# Patient Record
Sex: Male | Born: 1950 | Race: Black or African American | Hispanic: No | Marital: Married | Smoking: Former smoker
Health system: Southern US, Community
[De-identification: ages and names within clinical notes are randomized; demographics above are authoritative.]

## PROBLEM LIST (undated history)

## (undated) DIAGNOSIS — I2510 Atherosclerotic heart disease of native coronary artery without angina pectoris: Secondary | ICD-10-CM

## (undated) DIAGNOSIS — I739 Peripheral vascular disease, unspecified: Secondary | ICD-10-CM

## (undated) DIAGNOSIS — J45909 Unspecified asthma, uncomplicated: Secondary | ICD-10-CM

## (undated) DIAGNOSIS — L209 Atopic dermatitis, unspecified: Secondary | ICD-10-CM

## (undated) DIAGNOSIS — K219 Gastro-esophageal reflux disease without esophagitis: Secondary | ICD-10-CM

## (undated) DIAGNOSIS — E559 Vitamin D deficiency, unspecified: Secondary | ICD-10-CM

## (undated) DIAGNOSIS — E079 Disorder of thyroid, unspecified: Secondary | ICD-10-CM

## (undated) DIAGNOSIS — K635 Polyp of colon: Secondary | ICD-10-CM

## (undated) DIAGNOSIS — K5900 Constipation, unspecified: Secondary | ICD-10-CM

## (undated) DIAGNOSIS — E039 Hypothyroidism, unspecified: Secondary | ICD-10-CM

## (undated) DIAGNOSIS — Z8719 Personal history of other diseases of the digestive system: Secondary | ICD-10-CM

## (undated) DIAGNOSIS — D649 Anemia, unspecified: Secondary | ICD-10-CM

## (undated) DIAGNOSIS — J189 Pneumonia, unspecified organism: Secondary | ICD-10-CM

## (undated) DIAGNOSIS — E782 Mixed hyperlipidemia: Secondary | ICD-10-CM

## (undated) DIAGNOSIS — N289 Disorder of kidney and ureter, unspecified: Secondary | ICD-10-CM

## (undated) DIAGNOSIS — F419 Anxiety disorder, unspecified: Secondary | ICD-10-CM

## (undated) DIAGNOSIS — E7800 Pure hypercholesterolemia, unspecified: Secondary | ICD-10-CM

## (undated) DIAGNOSIS — G4730 Sleep apnea, unspecified: Secondary | ICD-10-CM

## (undated) DIAGNOSIS — E119 Type 2 diabetes mellitus without complications: Secondary | ICD-10-CM

## (undated) DIAGNOSIS — I639 Cerebral infarction, unspecified: Secondary | ICD-10-CM

## (undated) DIAGNOSIS — I779 Disorder of arteries and arterioles, unspecified: Secondary | ICD-10-CM

## (undated) DIAGNOSIS — N400 Enlarged prostate without lower urinary tract symptoms: Secondary | ICD-10-CM

## (undated) DIAGNOSIS — M109 Gout, unspecified: Secondary | ICD-10-CM

## (undated) DIAGNOSIS — I10 Essential (primary) hypertension: Secondary | ICD-10-CM

## (undated) DIAGNOSIS — G4700 Insomnia, unspecified: Secondary | ICD-10-CM

## (undated) DIAGNOSIS — E11319 Type 2 diabetes mellitus with unspecified diabetic retinopathy without macular edema: Secondary | ICD-10-CM

## (undated) DIAGNOSIS — I219 Acute myocardial infarction, unspecified: Secondary | ICD-10-CM

## (undated) DIAGNOSIS — H409 Unspecified glaucoma: Secondary | ICD-10-CM

## (undated) HISTORY — DX: Pneumonia, unspecified organism: J18.9

## (undated) HISTORY — PX: CORONARY ARTERY BYPASS GRAFT: SHX141

## (undated) HISTORY — PX: POLYPECTOMY: SHX149

## (undated) HISTORY — PX: EXPLORATION POST OPERATIVE OPEN HEART: SHX5061

## (undated) HISTORY — PX: CORONARY ANGIOPLASTY: SHX604

## (undated) HISTORY — PX: OTHER SURGICAL HISTORY: SHX169

## (undated) HISTORY — PX: COLONOSCOPY: SHX174

## (undated) HISTORY — PX: EYE SURGERY: SHX253

## (undated) HISTORY — PX: ESOPHAGOGASTRODUODENOSCOPY: SHX1529

---

## 1992-11-11 HISTORY — PX: NASAL POLYP EXCISION: SHX2068

## 2007-08-20 ENCOUNTER — Ambulatory Visit: Payer: Self-pay | Admitting: Gastroenterology

## 2010-08-08 ENCOUNTER — Ambulatory Visit: Payer: Self-pay | Admitting: Gastroenterology

## 2011-02-06 ENCOUNTER — Observation Stay: Payer: Self-pay | Admitting: Internal Medicine

## 2012-01-15 ENCOUNTER — Other Ambulatory Visit: Payer: Self-pay | Admitting: Nephrology

## 2012-01-22 ENCOUNTER — Ambulatory Visit
Admission: RE | Admit: 2012-01-22 | Discharge: 2012-01-22 | Disposition: A | Discharge status: Home / Self Care | Payer: BC Managed Care – PPO | Source: Ambulatory Visit | Attending: Nephrology | Admitting: Nephrology

## 2012-01-22 ENCOUNTER — Other Ambulatory Visit: Payer: Self-pay

## 2012-01-23 ENCOUNTER — Other Ambulatory Visit: Payer: Self-pay

## 2012-12-07 DIAGNOSIS — E11311 Type 2 diabetes mellitus with unspecified diabetic retinopathy with macular edema: Secondary | ICD-10-CM | POA: Insufficient documentation

## 2014-05-23 ENCOUNTER — Ambulatory Visit: Payer: Self-pay | Admitting: Gastroenterology

## 2015-04-03 DIAGNOSIS — E1169 Type 2 diabetes mellitus with other specified complication: Secondary | ICD-10-CM | POA: Insufficient documentation

## 2015-04-03 DIAGNOSIS — E785 Hyperlipidemia, unspecified: Secondary | ICD-10-CM

## 2016-09-11 ENCOUNTER — Encounter: Payer: Medicare Other | Attending: Nurse Practitioner | Admitting: Dietician

## 2016-09-11 ENCOUNTER — Encounter: Payer: Self-pay | Admitting: Dietician

## 2016-09-11 VITALS — Ht 71.0 in | Wt 246.5 lb

## 2016-09-11 DIAGNOSIS — Z713 Dietary counseling and surveillance: Secondary | ICD-10-CM | POA: Insufficient documentation

## 2016-09-11 DIAGNOSIS — E119 Type 2 diabetes mellitus without complications: Secondary | ICD-10-CM | POA: Insufficient documentation

## 2016-09-11 DIAGNOSIS — E1122 Type 2 diabetes mellitus with diabetic chronic kidney disease: Secondary | ICD-10-CM

## 2016-09-11 DIAGNOSIS — N183 Chronic kidney disease, stage 3 (moderate): Secondary | ICD-10-CM

## 2016-09-11 DIAGNOSIS — Z794 Long term (current) use of insulin: Secondary | ICD-10-CM

## 2016-09-11 DIAGNOSIS — N289 Disorder of kidney and ureter, unspecified: Secondary | ICD-10-CM | POA: Insufficient documentation

## 2016-09-11 NOTE — Patient Instructions (Signed)
   Eat a small breakfast and lunch daily. Can be a snack with protein and healthy carb if needed.   Eat generous portions of vegetables.   Keep up making healthy food choices.

## 2016-09-11 NOTE — Progress Notes (Signed)
Medical Nutrition Therapy: Visit start time: 0930  end time: 1100  Assessment:  Diagnosis: Type 2DM, stage 3 renal disease Past medical history: gout, HTN, HLD, CAD, hypothyroidism Psychosocial issues/ stress concerns: patient reports high stress due to family issues, and recent death of brother-in-law Preferred learning method:  . Hands-on  Current weight: 246.5lbs  Height: 5'11" Medications, supplements: reviewed list in chart and MD notes with patient and wife.  Progress and evaluation: Patient tests fasting BGs and reports range of 80-203mg /dl in past month; takes Lantus and Novolog both at bedtime and occasionally tests BG then as well. He reports working to keep BGs under 200.  States he has trouble forgetting to take insulin, or remembering after eating. He is eating 1 meal daily, sometimes fried foods especially chicken. Wife bakes chicken low sodium. He does eat snacks before going to sleep and often wakes up during the night and will snack then as well.   Physical activity: walking 1/4 - 1/2 mile occasionally  Dietary Intake:  Usual eating pattern includes 1 meals and 1+ snacks per day. Dining out frequency: 2-3 meals per week.  Breakfast: small glass milk with meds Snack: none Lunch: none -- usually diet soda Snack: none Supper: baked chicken string beans and corn or potatoes, salad, dirty rice, <1z a month pork chop or roast beef or potato; occasionally baked sweet potato, steamed mixed veg (doesn't like much); hamburger patty. State meal is balanced about 3 x a week. Fast food 2-3 times a week Snack: 9-10pm more food similar to supper foods. Sometimes will eat a banana or leftovers if waking up during the night.  Beverages: water, some decaf coffee with milk or creamer, 1 pkg sugar  Nutrition Care Education: Topics covered: diabetes, kidney disease/ hypertension Basic nutrition: basic food groups, appropriate nutrient balance, appropriate meal and snack schedule, general  nutrition guidelines    Weight control: benefits of weight control, controlling food portions and choosing low fat foods. Diabetes:  appropriate meal and snack schedule, appropriate carb intake and balance, guidance for 1700kcal daily intake; practiced basic meal planning using food models and food lists. Discussed role of stress and sleep on BG control, as well as the role of infrequent eating and importance of eating at regular intervals.  Kidney disease/ Hypertension:  importance of controlling BP, identifying high sodium foods, identifying food sources of potassium, magnesium; controlling protein portions to preserve kidney health and decrease risk of uric acid accumulation.  Other lifestyle changes:  Advised patient to continue working on sleep issues to improve rest.   Nutritional Diagnosis:  [place]-2.2 Altered nutrition-related laboratory As related to diabetes, renal disease.  As evidenced by lab report, patient report. [place]-3.3 Overweight/obesity As related to hypthyroidism, inactivity.  As evidenced by patient report.  Intervention: Instruction as noted above.   Patient sleepy during visit due to poor sleep overnight; wife participated fully in discussion.    Set goals with patient and wife's input.    Patient elected to schedule follow-up in January.      Education Materials given:  . General diet guidelines for Diabetes . Food lists/ Planning A Balanced Meal . Sample meal pattern/ menus: Quick and Healthy Meal Ideas . Snacking handout  Food diary sheets for meal planning . Goals/ instructions  Learner/ who was taught:  . Patient  . Spouse: Willey Blade  Level of understanding: Marland Kitchen Verbalizes/ demonstrates competency  Demonstrated degree of understanding via:   Teach back Learning barriers: . None  Willingness to learn/ readiness for  change: . Eager, change in progress  Monitoring and Evaluation:  Dietary intake, exercise, BG control, and body weight      follow up: 11/20/16

## 2016-11-20 ENCOUNTER — Ambulatory Visit: Payer: PRIVATE HEALTH INSURANCE | Admitting: Dietician

## 2016-12-13 ENCOUNTER — Encounter: Payer: Self-pay | Admitting: Dietician

## 2016-12-13 NOTE — Progress Notes (Signed)
Have not heard back from patient to reschedule appointment, which he cancelled on 11/20/16. Sent discharge letter to NP.

## 2017-01-23 ENCOUNTER — Encounter (HOSPITAL_COMMUNITY): Payer: Self-pay | Admitting: Emergency Medicine

## 2017-01-23 DIAGNOSIS — Z794 Long term (current) use of insulin: Secondary | ICD-10-CM

## 2017-01-23 DIAGNOSIS — E785 Hyperlipidemia, unspecified: Secondary | ICD-10-CM | POA: Diagnosis not present

## 2017-01-23 DIAGNOSIS — Z87891 Personal history of nicotine dependence: Secondary | ICD-10-CM | POA: Diagnosis not present

## 2017-01-23 DIAGNOSIS — E1122 Type 2 diabetes mellitus with diabetic chronic kidney disease: Secondary | ICD-10-CM | POA: Diagnosis present

## 2017-01-23 DIAGNOSIS — E1165 Type 2 diabetes mellitus with hyperglycemia: Secondary | ICD-10-CM | POA: Diagnosis present

## 2017-01-23 DIAGNOSIS — I129 Hypertensive chronic kidney disease with stage 1 through stage 4 chronic kidney disease, or unspecified chronic kidney disease: Secondary | ICD-10-CM | POA: Diagnosis present

## 2017-01-23 DIAGNOSIS — Z79899 Other long term (current) drug therapy: Secondary | ICD-10-CM | POA: Diagnosis not present

## 2017-01-23 DIAGNOSIS — E669 Obesity, unspecified: Secondary | ICD-10-CM | POA: Diagnosis not present

## 2017-01-23 DIAGNOSIS — E039 Hypothyroidism, unspecified: Secondary | ICD-10-CM | POA: Diagnosis not present

## 2017-01-23 DIAGNOSIS — N182 Chronic kidney disease, stage 2 (mild): Secondary | ICD-10-CM | POA: Diagnosis present

## 2017-01-23 DIAGNOSIS — I6302 Cerebral infarction due to thrombosis of basilar artery: Principal | ICD-10-CM | POA: Diagnosis present

## 2017-01-23 DIAGNOSIS — R270 Ataxia, unspecified: Secondary | ICD-10-CM | POA: Diagnosis present

## 2017-01-23 DIAGNOSIS — I2510 Atherosclerotic heart disease of native coronary artery without angina pectoris: Secondary | ICD-10-CM | POA: Diagnosis not present

## 2017-01-23 DIAGNOSIS — Z6834 Body mass index (BMI) 34.0-34.9, adult: Secondary | ICD-10-CM

## 2017-01-23 DIAGNOSIS — D72829 Elevated white blood cell count, unspecified: Secondary | ICD-10-CM | POA: Diagnosis not present

## 2017-01-23 DIAGNOSIS — Z823 Family history of stroke: Secondary | ICD-10-CM

## 2017-01-23 DIAGNOSIS — R4781 Slurred speech: Secondary | ICD-10-CM | POA: Diagnosis present

## 2017-01-23 LAB — BASIC METABOLIC PANEL
Anion gap: 11 (ref 5–15)
BUN: 25 mg/dL — ABNORMAL HIGH (ref 6–20)
CALCIUM: 9.3 mg/dL (ref 8.9–10.3)
CO2: 22 mmol/L (ref 22–32)
CREATININE: 1.58 mg/dL — AB (ref 0.61–1.24)
Chloride: 102 mmol/L (ref 101–111)
GFR, EST AFRICAN AMERICAN: 51 mL/min — AB (ref >60)
GFR, EST NON AFRICAN AMERICAN: 44 mL/min — AB (ref >60)
Glucose, Bld: 260 mg/dL — ABNORMAL HIGH (ref 65–99)
Potassium: 4.2 mmol/L (ref 3.5–5.1)
SODIUM: 135 mmol/L (ref 135–145)

## 2017-01-23 LAB — URINALYSIS, ROUTINE W REFLEX MICROSCOPIC
BILIRUBIN URINE: NEGATIVE
Bacteria, UA: NONE SEEN
KETONES UR: NEGATIVE mg/dL
LEUKOCYTES UA: NEGATIVE
Nitrite: NEGATIVE
PH: 5 (ref 5.0–8.0)
Protein, ur: 100 mg/dL — AB
SPECIFIC GRAVITY, URINE: 1.012 (ref 1.005–1.030)

## 2017-01-23 LAB — CBG MONITORING, ED: Glucose-Capillary: 241 mg/dL — ABNORMAL HIGH (ref 65–99)

## 2017-01-23 LAB — CBC
HCT: 38.6 % — ABNORMAL LOW (ref 39.0–52.0)
Hemoglobin: 12.7 g/dL — ABNORMAL LOW (ref 13.0–17.0)
MCH: 29 pg (ref 26.0–34.0)
MCHC: 32.9 g/dL (ref 30.0–36.0)
MCV: 88.1 fL (ref 78.0–100.0)
PLATELETS: 263 10*3/uL (ref 150–400)
RBC: 4.38 MIL/uL (ref 4.22–5.81)
RDW: 12.8 % (ref 11.5–15.5)
WBC: 11.9 10*3/uL — AB (ref 4.0–10.5)

## 2017-01-23 NOTE — ED Triage Notes (Signed)
Pt states he is been having difficulty with ambulation, and slurred speech for the past 2 days, LSW by wife 2 days ago, family states that pt is getting progressively worse today.

## 2017-01-24 ENCOUNTER — Inpatient Hospital Stay (HOSPITAL_COMMUNITY)
Admission: EM | Admit: 2017-01-24 | Discharge: 2017-01-24 | DRG: 066 | Disposition: A | Discharge status: Home / Self Care | Payer: Medicare Other | Attending: Internal Medicine | Admitting: Internal Medicine

## 2017-01-24 ENCOUNTER — Inpatient Hospital Stay (HOSPITAL_COMMUNITY): Payer: Medicare Other

## 2017-01-24 ENCOUNTER — Emergency Department (HOSPITAL_COMMUNITY): Payer: Medicare Other

## 2017-01-24 ENCOUNTER — Encounter (HOSPITAL_COMMUNITY): Payer: Self-pay | Admitting: Family Medicine

## 2017-01-24 DIAGNOSIS — E669 Obesity, unspecified: Secondary | ICD-10-CM | POA: Diagnosis not present

## 2017-01-24 DIAGNOSIS — I2510 Atherosclerotic heart disease of native coronary artery without angina pectoris: Secondary | ICD-10-CM | POA: Diagnosis not present

## 2017-01-24 DIAGNOSIS — E1169 Type 2 diabetes mellitus with other specified complication: Secondary | ICD-10-CM | POA: Diagnosis present

## 2017-01-24 DIAGNOSIS — E039 Hypothyroidism, unspecified: Secondary | ICD-10-CM | POA: Diagnosis present

## 2017-01-24 DIAGNOSIS — I6789 Other cerebrovascular disease: Secondary | ICD-10-CM | POA: Diagnosis not present

## 2017-01-24 DIAGNOSIS — E785 Hyperlipidemia, unspecified: Secondary | ICD-10-CM

## 2017-01-24 DIAGNOSIS — R4781 Slurred speech: Secondary | ICD-10-CM | POA: Diagnosis present

## 2017-01-24 DIAGNOSIS — I10 Essential (primary) hypertension: Secondary | ICD-10-CM

## 2017-01-24 DIAGNOSIS — Z0189 Encounter for other specified special examinations: Secondary | ICD-10-CM

## 2017-01-24 DIAGNOSIS — Z823 Family history of stroke: Secondary | ICD-10-CM | POA: Diagnosis not present

## 2017-01-24 DIAGNOSIS — R42 Dizziness and giddiness: Secondary | ICD-10-CM

## 2017-01-24 DIAGNOSIS — I639 Cerebral infarction, unspecified: Secondary | ICD-10-CM

## 2017-01-24 DIAGNOSIS — I6302 Cerebral infarction due to thrombosis of basilar artery: Secondary | ICD-10-CM | POA: Diagnosis not present

## 2017-01-24 DIAGNOSIS — N182 Chronic kidney disease, stage 2 (mild): Secondary | ICD-10-CM | POA: Diagnosis not present

## 2017-01-24 DIAGNOSIS — I129 Hypertensive chronic kidney disease with stage 1 through stage 4 chronic kidney disease, or unspecified chronic kidney disease: Secondary | ICD-10-CM | POA: Diagnosis not present

## 2017-01-24 DIAGNOSIS — Z794 Long term (current) use of insulin: Secondary | ICD-10-CM | POA: Diagnosis not present

## 2017-01-24 DIAGNOSIS — Z6834 Body mass index (BMI) 34.0-34.9, adult: Secondary | ICD-10-CM | POA: Diagnosis not present

## 2017-01-24 DIAGNOSIS — R270 Ataxia, unspecified: Secondary | ICD-10-CM | POA: Diagnosis not present

## 2017-01-24 DIAGNOSIS — E1165 Type 2 diabetes mellitus with hyperglycemia: Secondary | ICD-10-CM | POA: Diagnosis not present

## 2017-01-24 DIAGNOSIS — E1122 Type 2 diabetes mellitus with diabetic chronic kidney disease: Secondary | ICD-10-CM | POA: Diagnosis not present

## 2017-01-24 DIAGNOSIS — D72829 Elevated white blood cell count, unspecified: Secondary | ICD-10-CM | POA: Diagnosis not present

## 2017-01-24 DIAGNOSIS — Z87891 Personal history of nicotine dependence: Secondary | ICD-10-CM | POA: Diagnosis not present

## 2017-01-24 DIAGNOSIS — G4733 Obstructive sleep apnea (adult) (pediatric): Secondary | ICD-10-CM

## 2017-01-24 DIAGNOSIS — Z79899 Other long term (current) drug therapy: Secondary | ICD-10-CM | POA: Diagnosis not present

## 2017-01-24 HISTORY — DX: Disorder of thyroid, unspecified: E07.9

## 2017-01-24 HISTORY — DX: Essential (primary) hypertension: I10

## 2017-01-24 HISTORY — DX: Type 2 diabetes mellitus without complications: E11.9

## 2017-01-24 HISTORY — DX: Atherosclerotic heart disease of native coronary artery without angina pectoris: I25.10

## 2017-01-24 HISTORY — DX: Disorder of kidney and ureter, unspecified: N28.9

## 2017-01-24 LAB — LIPID PANEL
CHOL/HDL RATIO: 3.6 ratio
Cholesterol: 147 mg/dL (ref 0–200)
HDL: 41 mg/dL (ref >40)
LDL CALC: 91 mg/dL (ref 0–99)
TRIGLYCERIDES: 74 mg/dL (ref <150)
VLDL: 15 mg/dL (ref 0–40)

## 2017-01-24 LAB — ECHOCARDIOGRAM COMPLETE
Height: 71 in
WEIGHTICAEL: 3912 [oz_av]

## 2017-01-24 LAB — GLUCOSE, CAPILLARY
GLUCOSE-CAPILLARY: 240 mg/dL — AB (ref 65–99)
Glucose-Capillary: 291 mg/dL — ABNORMAL HIGH (ref 65–99)

## 2017-01-24 MED ORDER — HYDROCHLOROTHIAZIDE 25 MG PO TABS
25.0000 mg | ORAL_TABLET | Freq: Every day | ORAL | Status: DC
  Administered 2017-01-24: 25 mg via ORAL
  Filled 2017-01-24: qty 1

## 2017-01-24 MED ORDER — ACETAMINOPHEN 650 MG RE SUPP: 650.0000 mg | RECTAL | Status: DC | PRN

## 2017-01-24 MED ORDER — TAMSULOSIN HCL 0.4 MG PO CAPS
0.4000 mg | ORAL_CAPSULE | Freq: Every day | ORAL | Status: DC
  Administered 2017-01-24: 0.4 mg via ORAL
  Filled 2017-01-24: qty 1

## 2017-01-24 MED ORDER — CLOPIDOGREL BISULFATE 75 MG PO TABS: 75.0000 mg | ORAL_TABLET | Freq: Every day | ORAL | 0 refills | Status: DC

## 2017-01-24 MED ORDER — LEVOTHYROXINE SODIUM 50 MCG PO TABS
50.0000 ug | ORAL_TABLET | Freq: Every day | ORAL | Status: DC
  Administered 2017-01-24: 50 ug via ORAL
  Filled 2017-01-24: qty 1

## 2017-01-24 MED ORDER — INSULIN GLARGINE 100 UNIT/ML ~~LOC~~ SOLN
66.0000 [IU] | Freq: Every day | SUBCUTANEOUS | Status: DC
  Filled 2017-01-24: qty 0.66

## 2017-01-24 MED ORDER — ASPIRIN 81 MG PO CHEW
324.0000 mg | CHEWABLE_TABLET | Freq: Once | ORAL | Status: AC
  Administered 2017-01-24: 324 mg via ORAL
  Filled 2017-01-24: qty 4

## 2017-01-24 MED ORDER — ASPIRIN 325 MG PO TABS: 325.0000 mg | ORAL_TABLET | Freq: Every day | ORAL | Status: DC

## 2017-01-24 MED ORDER — ASPIRIN 325 MG PO TABS: 325.0000 mg | ORAL_TABLET | Freq: Every day | ORAL | 0 refills | Status: DC

## 2017-01-24 MED ORDER — ASPIRIN 300 MG RE SUPP: 300.0000 mg | Freq: Every day | RECTAL | Status: DC

## 2017-01-24 MED ORDER — ACETAMINOPHEN 325 MG PO TABS: 650.0000 mg | ORAL_TABLET | ORAL | Status: DC | PRN

## 2017-01-24 MED ORDER — SENNOSIDES-DOCUSATE SODIUM 8.6-50 MG PO TABS: 1.0000 | ORAL_TABLET | Freq: Every evening | ORAL | Status: DC | PRN

## 2017-01-24 MED ORDER — INSULIN ASPART 100 UNIT/ML ~~LOC~~ SOLN: 0.0000 [IU] | Freq: Every day | SUBCUTANEOUS | Status: DC

## 2017-01-24 MED ORDER — ENOXAPARIN SODIUM 40 MG/0.4ML ~~LOC~~ SOLN
40.0000 mg | Freq: Every day | SUBCUTANEOUS | Status: DC
  Administered 2017-01-24: 40 mg via SUBCUTANEOUS
  Filled 2017-01-24: qty 0.4

## 2017-01-24 MED ORDER — ALPRAZOLAM 0.25 MG PO TABS
0.2500 mg | ORAL_TABLET | Freq: Two times a day (BID) | ORAL | Status: DC | PRN
Start: 2017-01-24 — End: 2017-01-24
  Administered 2017-01-24: 0.25 mg via ORAL
  Filled 2017-01-24: qty 1

## 2017-01-24 MED ORDER — ALPRAZOLAM 0.25 MG PO TABS: 0.2500 mg | ORAL_TABLET | Freq: Every evening | ORAL | Status: DC | PRN

## 2017-01-24 MED ORDER — STROKE: EARLY STAGES OF RECOVERY BOOK
Freq: Once | Status: AC
  Administered 2017-01-24: 14:00:00
  Filled 2017-01-24: qty 1

## 2017-01-24 MED ORDER — INSULIN ASPART 100 UNIT/ML ~~LOC~~ SOLN
0.0000 [IU] | Freq: Three times a day (TID) | SUBCUTANEOUS | Status: DC
  Administered 2017-01-24: 5 [IU] via SUBCUTANEOUS
  Administered 2017-01-24: 8 [IU] via SUBCUTANEOUS

## 2017-01-24 MED ORDER — ROSUVASTATIN CALCIUM 40 MG PO TABS
40.0000 mg | ORAL_TABLET | Freq: Every day | ORAL | Status: DC
  Filled 2017-01-24: qty 1

## 2017-01-24 MED ORDER — LISINOPRIL 20 MG PO TABS
40.0000 mg | ORAL_TABLET | Freq: Every day | ORAL | Status: DC
  Administered 2017-01-24: 40 mg via ORAL
  Filled 2017-01-24: qty 2

## 2017-01-24 MED ORDER — ACETAMINOPHEN 160 MG/5ML PO SOLN: 650.0000 mg | ORAL | Status: DC | PRN

## 2017-01-24 MED ORDER — METOPROLOL TARTRATE 25 MG PO TABS
25.0000 mg | ORAL_TABLET | Freq: Two times a day (BID) | ORAL | Status: DC
  Administered 2017-01-24: 25 mg via ORAL
  Filled 2017-01-24: qty 1

## 2017-01-24 MED ORDER — CLOPIDOGREL BISULFATE 75 MG PO TABS
75.0000 mg | ORAL_TABLET | Freq: Every day | ORAL | Status: DC
  Administered 2017-01-24: 75 mg via ORAL
  Filled 2017-01-24: qty 1

## 2017-01-24 MED ORDER — AMLODIPINE BESYLATE 5 MG PO TABS
5.0000 mg | ORAL_TABLET | Freq: Every day | ORAL | Status: DC
  Administered 2017-01-24: 5 mg via ORAL
  Filled 2017-01-24: qty 1

## 2017-01-24 NOTE — Progress Notes (Signed)
SLP Cancellation Note  Patient Details Name: Roberto Ingram MRN: [id number] DOB: 1951/04/27   Cancelled treatment:       Reason Eval/Treat Not Completed: Patient at procedure or test/unavailable x2.  Will f/u next date.    [name] 01/24/2017, 2:22 PM

## 2017-01-24 NOTE — ED Notes (Signed)
Admitting MD at the bedside.  

## 2017-01-24 NOTE — Progress Notes (Signed)
  Echocardiogram 2D Echocardiogram has been performed.  Jennette Dubin 01/24/2017, 10:10 AM

## 2017-01-24 NOTE — ED Notes (Signed)
Report attempted, RN to call back in 10 min.

## 2017-01-24 NOTE — ED Provider Notes (Signed)
TIME SEEN: 2:19 AM By signing my name below, I, Arianna Nassar, attest that this documentation has been prepared under the direction and in the presence of [employer], DO.  Electronically Signed: Julien Nordmann, ED Scribe. 01/24/17. 1:57 AM.  CHIEF COMPLAINT:  Chief Complaint  Patient presents with  . Dizziness    loose of balance   HPI:  HPI Comments: Roberto Ingram is a 66 y.o. male who has a PMhx of CAD, DM, HTN, and thyroid disease presents to the Emergency Department complaining of moderate, gradually worsening dizziness that he describes as off-balance that began two days ago (Wednesday 01/22/17) ~ noon. He expresses that he is unable to walk without stumbling. Pt notes feeling very off balance when ambulating. Per wife, pt has also been having slurred speech. He notes that when he looks down or lifts his head it exacerbates his symptoms. He is not in any current pain. Pt had a CT scan performed today at [place] that was ordered by his PCP at [facility] and was found to have a "black spot on my brain". He has not had any falls or trauma recently. Pt takes one 81 mg ASA daily but is not on any other anticoagulants.  Pt has no hx of TIA, CVA, a-fib, or high cholesterol. He denies headache, numbness, tingling, focal weakness, bilateral ear pain, tinnitus, Hearing loss, chest pain, shortness of breath. He is a former smoker and reports he stopped smoking in 1994.   ROS: See HPI Constitutional: no fever  Eyes: no drainage  ENT: no runny nose   Cardiovascular:  no chest pain  Resp: no SOB  GI: no vomiting GU: no dysuria Integumentary: no rash  Allergy: no hives  Musculoskeletal: no leg swelling  Neurological: no slurred speech ROS otherwise negative  PAST MEDICAL HISTORY/PAST SURGICAL HISTORY:  Past Medical History:  Diagnosis Date  . Coronary artery disease   . Diabetes mellitus without complication ([place])   . Hypertension   . Renal disorder   .  Thyroid disease     MEDICATIONS:  Prior to Admission medications   Medication Sig Start Date End Date Taking? Authorizing Provider  albuterol (PROVENTIL HFA;VENTOLIN HFA) 108 (90 Base) MCG/ACT inhaler Inhale into the lungs.    Historical Provider, MD  allopurinol (ZYLOPRIM) 20 mg/mL SUSP Take by mouth.    Historical Provider, MD  ALPRAZolam Duanne Moron) 0.5 MG tablet Take by mouth.    Historical Provider, MD  amLODipine (NORVASC) 5 MG tablet Take by mouth.    Historical Provider, MD  aspirin EC 81 MG tablet Take by mouth.    Historical Provider, MD  budesonide (PULMICORT) 180 MCG/ACT inhaler Inhale into the lungs as needed.    Historical Provider, MD  cetirizine (ZYRTEC) 10 MG tablet Take by mouth.    Historical Provider, MD  fluticasone (CUTIVATE) 0.05 % cream  06/29/10   Historical Provider, MD  hydrochlorothiazide (HYDRODIURIL) 25 MG tablet Take by mouth.    Historical Provider, MD  insulin glargine (LANTUS) 100 UNIT/ML injection Inject into the skin.    Historical Provider, MD  insulin regular (NOVOLIN R,HUMULIN R) 100 units/mL injection Inject 12 to 26 units subcutaneously three times a day before each meal 07/10/16   Historical Provider, MD  Insulin Syringe-Needle U-100 (INSULIN SYRINGE .5CC/31GX5/16") 31G X 5/16" 0.5 ML MISC 4 (four) times daily. 03/01/16   Historical Provider, MD  levothyroxine (SYNTHROID) 50 MCG tablet Take by mouth. 01/12/14   Historical Provider, MD  lisinopril (PRINIVIL,ZESTRIL) 40  MG tablet Take by mouth.    Historical Provider, MD  metoprolol tartrate (LOPRESSOR) 25 MG tablet Take by mouth. 03/28/14   Historical Provider, MD  nitroGLYCERIN (NITROSTAT) 0.4 MG SL tablet Place under the tongue.    Historical Provider, MD  Omega-3 Fatty Acids (FISH OIL) 1000 MG CAPS Take by mouth.    Historical Provider, MD  rosuvastatin (CRESTOR) 40 MG tablet Take by mouth. 03/29/14   Historical Provider, MD  tamsulosin (FLOMAX) 0.4 MG CAPS capsule Take by mouth.    Historical Provider, MD   traZODone (DESYREL) 50 MG tablet Take by mouth. 04/11/14   Historical Provider, MD  Vitamin D, Ergocalciferol, (DRISDOL) 50000 units CAPS capsule Take 1 tablet by mouth twice each week    Historical Provider, MD    ALLERGIES:  Allergies  Allergen Reactions  . Diphenhydramine Hcl Other (See Comments)    "The more I take, it makes my prostate swell..."  . Ezetimibe Other (See Comments)  . Metformin Other (See Comments)  . Other Other (See Comments) and Itching    FA Dye Prostate swelling  . Sulfa Antibiotics Other (See Comments)    SOCIAL HISTORY:  Social History  Substance Use Topics  . Smoking status: Former Smoker    Types: Cigarettes    Quit date: 09/11/1993  . Smokeless tobacco: Never Used  . Alcohol use No    FAMILY HISTORY: History reviewed. No pertinent family history.  EXAM: BP (!) 176/73   Pulse (!) 57   Temp 97.8 F (36.6 C)   Resp (!) 22   Ht 5\' 11"  (1.803 m)   Wt 246 lb (111.6 kg)   SpO2 100%   BMI 34.31 kg/m  CONSTITUTIONAL: Alert and oriented x 3 and responds appropriately to questions. Well-appearing; well-nourished, Elderly HEAD: Normocephalic, atraumatic EYES: Conjunctivae clear, pupils appear equal, EOMI ENT: normal nose; moist mucous membranes; No pharyngeal erythema or petechiae, no tonsillar hypertrophy or exudate, no uvular deviation, no unilateral swelling, no trismus or drooling, no muffled voice, normal phonation, no stridor, no dental caries present, no drainable dental abscess noted, no Ludwig's angina, tongue sits flat in the bottom of the mouth, no angioedema, no facial erythema or warmth, no facial swelling; no pain with movement of the neck.  TMs are clear bilaterally without erythema, purulence, bulging, perforation, effusion.  No cerumen impaction or sign of foreign body in the external auditory canal. No inflammation, erythema or drainage from the external auditory canal. No signs of mastoiditis. No pain with manipulation of the pinna  bilaterally. NECK: Supple, no meningismus, no nuchal rigidity, no LAD  CARD: RRR; S1 and S2 appreciated; no murmurs, no clicks, no rubs, no gallops RESP: Normal chest excursion without splinting or tachypnea; breath sounds clear and equal bilaterally; no wheezes, no rhonchi, no rales, no hypoxia or respiratory distress, speaking full sentences ABD/GI: Normal bowel sounds; non-distended; soft, non-tender, no rebound, no guarding, no peritoneal signs, no hepatosplenomegaly BACK:  The back appears normal and is non-tender to palpation, there is no CVA tenderness EXT: Normal ROM in all joints; non-tender to palpation; no edema; normal capillary refill; no cyanosis, no calf tenderness or swelling    SKIN: Normal color for age and race; warm; no rash NEURO: Moves all extremities equally; Strength 5/5 in all four extremities.  Normal sensation diffusely.  CN 2-12 grossly intact.  No dysmetria to finger to nose testing with the right arm but does seem that a little bit of difficulty with the left arm. He has normal  heel to shin testing with the right leg that appears to have some difficulty with the left leg. His speech seems to be slow and deliberate. I do notice mild dysarthria but no significant aphasia. PSYCH: The patient's mood and manner are appropriate. Grooming and personal hygiene are appropriate.  MEDICAL DECISION MAKING: Patient here with changes in his speech and gait for the past 2 days. Outside of TPA window. Had a head CT as an outpatient in [place] that he reports showed "a black spot on my brain". He does not, with records of these images and they are not in our system. No headache or recent head injury. Not on anticoagulation but is on aspirin. I doubt that this is an intracranial hemorrhage. Will proceed with MRI for evaluation for possible stroke. Labs show mild elevated kidney function 1.58 which he reports is his baseline. He is also mildly hyperglycemic without DKA. Urine shows hematuria,  proteinuria which is likely also chronic from his chronic kidney disease. No sign of UTI.  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 Again patient is outside TPA window given symptoms for over 24 hours. 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 There is no large vessel occasion seen on MRA. 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 We'll discuss with neurology on call.   5:30 AM  D/w Dr. Leonel Ramsay with neurology. He will see the patient consult. Will admit to medicine.   5:47 AM Discussed patient's case with hospitalist, Dr. Loleta Books.  I have recommended admission and patient (and family if present) agree with this plan. Admitting physician will place admission orders.   I reviewed all nursing notes, vitals, pertinent previous records, EKGs, lab and urine results, imaging (as available).     EKG Interpretation  Date/Time:  Thursday January 23 2017 21:16:29 EDT Ventricular Rate:  83 PR Interval:  166 QRS Duration: 100 QT Interval:  378 QTC Calculation: 444 R Axis:   -42 Text Interpretation:  Sinus rhythm with Premature atrial complexes Left axis deviation Left ventricular hypertrophy with repolarization abnormality Abnormal ECG No significant change since last tracing in 2012 Confirmed by Fremon Zacharia,  DO, Laquan Beier [phone]) on 01/24/2017 1:47:30 AM        I personally performed the services described in this documentation, which was scribed in my presence. The recorded information has been reviewed and is accurate.      [place], DO 01/24/17 [phone]

## 2017-01-24 NOTE — H&P (Signed)
History and Physical  Patient Name: Roberto Ingram     RCV:[id number]    DOB: 1951/09/12    DOA: 01/24/2017 PCP: Renee Rival, NP   Patient coming from: Home     Chief Complaint: Slurred speech  HPI: Roberto Ingram is a 66 y.o. male with a past medical history significant for IDDM, HTN, hypothyroidism who presents with slurred speech, leg weakness and ataxia for 3 days.  The patient was in his usual state of health until about 2 days ago when he had sudden onset of sensation of being "off balance", as well as heaviness of his left leg, and slurred speech. He was seen by his PCP, who ordered CT of the head, and then prescribed meclizine after that. Today symptoms seem to be getting worse, so he came to the emergency room.  ED course: -Afebrile, heart rate 60, respirations pulse ox normal, blood pressure 158/74. -Na 135, K 4.2, Cr 1.6 (baseline 1.6), WBC 11.9 K, Hgb 12.7 -Glucose 260 -MR brain showed a new pontine infarct      Review of systems:  Review of Systems  Neurological: Positive for dizziness, speech change and focal weakness.  All other systems reviewed and are negative.        Past Medical History:  Diagnosis Date  . Coronary artery disease   . Diabetes mellitus without complication ([place])   . Hypertension   . Renal disorder   . Thyroid disease     Past Surgical History:  Procedure Laterality Date  . EXPLORATION POST OPERATIVE OPEN HEART      Social History: Patient lives with his wife.  Patient walks unassisted.  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  He is a former smoker.    Allergies  Allergen Reactions  . Diphenhydramine Hcl Other (See Comments)    "The more I take, it makes my prostate swell..."  . Ezetimibe Other (See Comments)  . Metformin Other (See Comments)  . Other Other (See Comments) and Itching    FA Dye Prostate swelling  . Sulfa Antibiotics Other (See Comments)    Family history: family history includes  Atrial fibrillation in his mother; Diabetes in his brother and father; Heart attack in his brother; Heart disease in his mother; Lung cancer in his brother; Stroke in his brother and mother.  Prior to Admission medications   Medication Sig Start Date End Date Taking? Authorizing Provider  albuterol (PROVENTIL HFA;VENTOLIN HFA) 108 (90 Base) MCG/ACT inhaler Inhale into the lungs.    Historical Provider, MD  allopurinol (ZYLOPRIM) 20 mg/mL SUSP Take by mouth.    Historical Provider, MD  ALPRAZolam Duanne Moron) 0.5 MG tablet Take by mouth.    Historical Provider, MD  amLODipine (NORVASC) 5 MG tablet Take by mouth.    Historical Provider, MD  budesonide (PULMICORT) 180 MCG/ACT inhaler Inhale into the lungs as needed.    Historical Provider, MD  cetirizine (ZYRTEC) 10 MG tablet Take by mouth.    Historical Provider, MD  fluticasone (CUTIVATE) 0.05 % cream  06/29/10   Historical Provider, MD  hydrochlorothiazide (HYDRODIURIL) 25 MG tablet Take by mouth.    Historical Provider, MD  insulin glargine (LANTUS) 100 UNIT/ML injection Inject into the skin.    Historical Provider, MD  insulin regular (NOVOLIN R,HUMULIN R) 100 units/mL injection Inject 12 to 26 units subcutaneously three times a day before each meal 07/10/16   Historical Provider, MD  Insulin Syringe-Needle U-100 (INSULIN SYRINGE .5CC/31GX5/16") 31G X 5/16" 0.5 ML  MISC 4 (four) times daily. 03/01/16   Historical Provider, MD  levothyroxine (SYNTHROID) 50 MCG tablet Take by mouth. 01/12/14   Historical Provider, MD  lisinopril (PRINIVIL,ZESTRIL) 40 MG tablet Take by mouth.    Historical Provider, MD  metoprolol tartrate (LOPRESSOR) 25 MG tablet Take by mouth. 03/28/14   Historical Provider, MD  nitroGLYCERIN (NITROSTAT) 0.4 MG SL tablet Place under the tongue.    Historical Provider, MD  Omega-3 Fatty Acids (FISH OIL) 1000 MG CAPS Take by mouth.    Historical Provider, MD  rosuvastatin (CRESTOR) 40 MG tablet Take by mouth. 03/29/14   Historical Provider, MD   tamsulosin (FLOMAX) 0.4 MG CAPS capsule Take by mouth.    Historical Provider, MD  traZODone (DESYREL) 50 MG tablet Take by mouth. 04/11/14   Historical Provider, MD  Vitamin D, Ergocalciferol, (DRISDOL) 50000 units CAPS capsule Take 1 tablet by mouth twice each week    Historical Provider, MD     Physical Exam: BP (!) 177/73 (BP Location: Right Arm)   Pulse 63   Temp 97.4 F (36.3 C) (Oral)   Resp 18   Ht 5\' 11"  (1.803 m)   Wt 110.9 kg (244 lb 8 oz)   SpO2 98%   BMI 34.10 kg/m  General appearance: Well-developed, adult male, alert and in no acute distress.   Eyes: Anicteric, conjunctiva pink, lids and lashes normal. PERRL.    ENT: No nasal deformity, discharge, epistaxis.  Hearing normal. OP moist without lesions.   Dentition good. Lymph: No cervical, supraclavicular or axillary lymphadenopathy. Skin: Warm and dry.  No jaundice.  No suspicious rashes or lesions. Cardiac: RRR, nl S1-S2, no murmurs appreciated.  Capillary refill is brisk.  JVP normal.  No LE edema.  Radial and DP pulses 2+ and symmetric.  No carotid bruits. Respiratory: Normal respiratory rate and rhythm.  CTAB without rales or wheezes. GI: Abdomen soft without rigidity.  No TTP. No ascites, distension, no hepatosplenomegaly.   MSK: No deformities or effusions. Neuro: Pupils are 4 mm and reactive to 3 mm. Extraocular movements are intact, without nystagmus. Cranial nerve 5 is within normal limits. Cranial nerve 7 is symmetrical. Cranial nerve 8 is within normal limits. Cranial nerves 9 and 10 reveal equal palate elevation. Cranial nerve 11 reveals sternocleidomastoid strong. Cranial nerve 12 is midline. I do not note a deficit in motor strength testing in the upper and lower extremities bilaterally with normal motor, tone and bulk. Romberg maneuver is negative for pathology. Finger-to-nose testing is within normal limits. Speech is fluent. Naming is grossly intact. Attention span and concentration are within  normal limits.   Psych: The patient is oriented to time, place and person. Behavior appropriate.  Affect normal.  Recall, recent and remote, as well as general fund of knowledge seem within normal limits. No evidence of aural or visual hallucinations or delusions.       Labs on Admission:  I have personally reviewed following labs and imaging studies: CBC:  Recent Labs Lab 01/23/17 2125  WBC 11.9*  HGB 12.7*  HCT 38.6*  MCV 88.1  PLT 595   Basic Metabolic Panel:  Recent Labs Lab 01/23/17 2125  NA 135  K 4.2  CL 102  CO2 22  GLUCOSE 260*  BUN 25*  CREATININE 1.58*  CALCIUM 9.3   GFR: Estimated Creatinine Clearance: 58.2 mL/min (A) (by C-G formula based on SCr of 1.58 mg/dL (H)). Liver Function Tests: No results for input(s): AST, ALT, ALKPHOS, BILITOT, PROT, ALBUMIN in the  last 168 hours. No results for input(s): LIPASE, AMYLASE in the last 168 hours. No results for input(s): AMMONIA in the last 168 hours. Coagulation Profile: No results for input(s): INR, PROTIME in the last 168 hours. Cardiac Enzymes: No results for input(s): CKTOTAL, CKMB, CKMBINDEX, TROPONINI in the last 168 hours. BNP (last 3 results) No results for input(s): PROBNP in the last 8760 hours. HbA1C: No results for input(s): HGBA1C in the last 72 hours. CBG:  Recent Labs Lab 01/23/17 2125  GLUCAP 241*   Lipid Profile: No results for input(s): CHOL, HDL, LDLCALC, TRIG, CHOLHDL, LDLDIRECT in the last 72 hours. Thyroid Function Tests: No results for input(s): TSH, T4TOTAL, FREET4, T3FREE, THYROIDAB in the last 72 hours. Anemia Panel: No results for input(s): VITAMINB12, FOLATE, FERRITIN, TIBC, IRON, RETICCTPCT in the last 72 hours. Sepsis Labs: Invalid input(s): PROCALCITONIN, LACTICIDVEN No results found for this or any previous visit (from the past 240 hour(s)).    Radiological Exams on Admission: Personally reviewed MR report: Mr Virgel Paling BO Contrast  Result Date:  01/24/2017 CLINICAL DATA:  Gradually worsening dizziness and gait imbalance for 2 days. Slurred speech. History of hypertension and diabetes. Follow-up abnormal CT HEAD. EXAM: MRI HEAD WITHOUT CONTRAST MRA HEAD WITHOUT CONTRAST MRA NECK WITHOUT CONTRAST TECHNIQUE: Multiplanar, multiecho pulse sequences of the brain and surrounding structures were obtained without intravenous contrast. Angiographic images of the Circle of Willis were obtained using MRA technique without intravenous contrast. Angiographic images of the neck were obtained using MRA technique without intravenous contrast. Carotid stenosis measurements (when applicable) are obtained utilizing NASCET criteria, using the distal internal carotid diameter as the denominator. COMPARISON:  None. FINDINGS: MRI HEAD FINDINGS BRAIN: RIGHT pontine 28 x 8 mm (AP by transverse) reduced diffusion with low ADC values. No susceptibility artifact to suggest hemorrhage. The ventricles and sulci are normal for patient's age, cavum septum pellucidum et vergae. Patchy supratentorial pontine white matter FLAIR T2 hyperintensities exclusive of the aforementioned abnormality. Patchy small areas RIGHT frontal encephalomalacia. No suspicious parenchymal signal, masses or mass effect. No abnormal extra-axial fluid collections. VASCULAR: Normal major intracranial vascular flow voids present at skull base. SKULL AND UPPER CERVICAL SPINE: No abnormal sellar expansion. No suspicious calvarial bone marrow signal. Craniocervical junction maintained. SINUSES/ORBITS: Paranasal sinus mucosal thickening with maxillary mucosal retention cysts, status post FESS. The included ocular globes and orbital contents are non-suspicious. Status post bilateral ocular lens implants. OTHER: None. MRA HEAD FINDINGS ANTERIOR CIRCULATION: Flow related enhancement bilateral internal carotid arteries. Moderate stenosis RIGHT supraclinoid internal carotid artery. Mild stenosis RIGHT A1 segment. Bilateral A1  segment and anterior communicating artery are patent. Patent bilateral anterior cerebral arteries. Mild stenosis RIGHT M1 segment, bilateral middle cerebral arteries are patent. No aneurysm, severe stenosis, emergent large vessel occlusion or abnormal luminal irregularity. POSTERIOR CIRCULATION: RIGHT vertebral artery dominant. Flow related enhancement bilateral vertebral artery's. 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 Moderate stenosis distal basilar artery. Patent main branch vessels. Patent bilateral posterior cerebral arteries. ANATOMIC VARIANTS: None. MRA NECK FINDINGS- time-of-flight technique, arch vessel origins not characterized. ANTERIOR CIRCULATION: The common carotid arteries are widely patent bilaterally. The carotid bifurcations are patent bilaterally without hemodynamically significant stenosis by NASCET criteria. Mild luminal irregularity LEFT proximal internal carotid artery most compatible with atherosclerosis. No flow limiting stenosis. POSTERIOR CIRCULATION: Bilateral vertebral arteries are patent to the vertebrobasilar junction. No flow limiting stenosis or luminal irregularity. Moderate stenosis RIGHT mid V2 segment. Source images and MIP images were reviewed. IMPRESSION: MRI HEAD: Acute  RIGHT pontine small vessel infarct. Mild to moderate chronic small vessel ischemic disease. Old small RIGHT frontal lobe infarcts. MRA HEAD: No emergent large vessel occlusion. 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 Moderate stenosis RIGHT supraclinoid internal carotid artery and distal basilar artery compatible with atherosclerosis. MRA NECK: Limited time-of-flight technique. Moderate stenosis RIGHT V2 segment. No hemodynamically significant stenosis of the carotid artery's. Electronically Signed   By: Elon Alas M.D.   On: 01/24/2017 05:17   Mr Jodene Nam Neck Wo Contrast  Result Date: 01/24/2017 CLINICAL DATA:  Gradually worsening dizziness  and gait imbalance for 2 days. Slurred speech. History of hypertension and diabetes. Follow-up abnormal CT HEAD. EXAM: MRI HEAD WITHOUT CONTRAST MRA HEAD WITHOUT CONTRAST MRA NECK WITHOUT CONTRAST TECHNIQUE: Multiplanar, multiecho pulse sequences of the brain and surrounding structures were obtained without intravenous contrast. Angiographic images of the Circle of Willis were obtained using MRA technique without intravenous contrast. Angiographic images of the neck were obtained using MRA technique without intravenous contrast. Carotid stenosis measurements (when applicable) are obtained utilizing NASCET criteria, using the distal internal carotid diameter as the denominator. COMPARISON:  None. FINDINGS: MRI HEAD FINDINGS BRAIN: RIGHT pontine 28 x 8 mm (AP by transverse) reduced diffusion with low ADC values. No susceptibility artifact to suggest hemorrhage. The ventricles and sulci are normal for patient's age, cavum septum pellucidum et vergae. Patchy supratentorial pontine white matter FLAIR T2 hyperintensities exclusive of the aforementioned abnormality. Patchy small areas RIGHT frontal encephalomalacia. No suspicious parenchymal signal, masses or mass effect. No abnormal extra-axial fluid collections. VASCULAR: Normal major intracranial vascular flow voids present at skull base. SKULL AND UPPER CERVICAL SPINE: No abnormal sellar expansion. No suspicious calvarial bone marrow signal. Craniocervical junction maintained. SINUSES/ORBITS: Paranasal sinus mucosal thickening with maxillary mucosal retention cysts, status post FESS. The included ocular globes and orbital contents are non-suspicious. Status post bilateral ocular lens implants. OTHER: None. MRA HEAD FINDINGS ANTERIOR CIRCULATION: Flow related enhancement bilateral internal carotid arteries. Moderate stenosis RIGHT supraclinoid internal carotid artery. Mild stenosis RIGHT A1 segment. Bilateral A1 segment and anterior communicating artery are patent.  Patent bilateral anterior cerebral arteries. Mild stenosis RIGHT M1 segment, bilateral middle cerebral arteries are patent. No aneurysm, severe stenosis, emergent large vessel occlusion or abnormal luminal irregularity. POSTERIOR CIRCULATION: RIGHT vertebral artery dominant. Flow related enhancement bilateral vertebral artery's. 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 Moderate stenosis distal basilar artery. Patent main branch vessels. Patent bilateral posterior cerebral arteries. ANATOMIC VARIANTS: None. MRA NECK FINDINGS- time-of-flight technique, arch vessel origins not characterized. ANTERIOR CIRCULATION: The common carotid arteries are widely patent bilaterally. The carotid bifurcations are patent bilaterally without hemodynamically significant stenosis by NASCET criteria. Mild luminal irregularity LEFT proximal internal carotid artery most compatible with atherosclerosis. No flow limiting stenosis. POSTERIOR CIRCULATION: Bilateral vertebral arteries are patent to the vertebrobasilar junction. No flow limiting stenosis or luminal irregularity. Moderate stenosis RIGHT mid V2 segment. Source images and MIP images were reviewed. IMPRESSION: MRI HEAD: Acute RIGHT pontine small vessel infarct. Mild to moderate chronic small vessel ischemic disease. Old small RIGHT frontal lobe infarcts. MRA HEAD: No emergent large vessel occlusion. 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 Moderate stenosis RIGHT supraclinoid internal carotid artery and distal basilar artery compatible with atherosclerosis. MRA NECK: Limited time-of-flight technique. Moderate stenosis RIGHT V2 segment. No hemodynamically significant stenosis of the carotid artery's. Electronically Signed   By: Elon Alas M.D.   On: 01/24/2017 05:17   Mr Brain Wo Contrast  Result Date: 01/24/2017 CLINICAL  DATA:  Gradually worsening dizziness and gait imbalance for 2 days. Slurred speech. History of  hypertension and diabetes. Follow-up abnormal CT HEAD. EXAM: MRI HEAD WITHOUT CONTRAST MRA HEAD WITHOUT CONTRAST MRA NECK WITHOUT CONTRAST TECHNIQUE: Multiplanar, multiecho pulse sequences of the brain and surrounding structures were obtained without intravenous contrast. Angiographic images of the Circle of Willis were obtained using MRA technique without intravenous contrast. Angiographic images of the neck were obtained using MRA technique without intravenous contrast. Carotid stenosis measurements (when applicable) are obtained utilizing NASCET criteria, using the distal internal carotid diameter as the denominator. COMPARISON:  None. FINDINGS: MRI HEAD FINDINGS BRAIN: RIGHT pontine 28 x 8 mm (AP by transverse) reduced diffusion with low ADC values. No susceptibility artifact to suggest hemorrhage. The ventricles and sulci are normal for patient's age, cavum septum pellucidum et vergae. Patchy supratentorial pontine white matter FLAIR T2 hyperintensities exclusive of the aforementioned abnormality. Patchy small areas RIGHT frontal encephalomalacia. No suspicious parenchymal signal, masses or mass effect. No abnormal extra-axial fluid collections. VASCULAR: Normal major intracranial vascular flow voids present at skull base. SKULL AND UPPER CERVICAL SPINE: No abnormal sellar expansion. No suspicious calvarial bone marrow signal. Craniocervical junction maintained. SINUSES/ORBITS: Paranasal sinus mucosal thickening with maxillary mucosal retention cysts, status post FESS. The included ocular globes and orbital contents are non-suspicious. Status post bilateral ocular lens implants. OTHER: None. MRA HEAD FINDINGS ANTERIOR CIRCULATION: Flow related enhancement bilateral internal carotid arteries. Moderate stenosis RIGHT supraclinoid internal carotid artery. Mild stenosis RIGHT A1 segment. Bilateral A1 segment and anterior communicating artery are patent. Patent bilateral anterior cerebral arteries. Mild stenosis  RIGHT M1 segment, bilateral middle cerebral arteries are patent. No aneurysm, severe stenosis, emergent large vessel occlusion or abnormal luminal irregularity. POSTERIOR CIRCULATION: RIGHT vertebral artery dominant. Flow related enhancement bilateral vertebral artery's. 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 Moderate stenosis distal basilar artery. Patent main branch vessels. Patent bilateral posterior cerebral arteries. ANATOMIC VARIANTS: None. MRA NECK FINDINGS- time-of-flight technique, arch vessel origins not characterized. ANTERIOR CIRCULATION: The common carotid arteries are widely patent bilaterally. The carotid bifurcations are patent bilaterally without hemodynamically significant stenosis by NASCET criteria. Mild luminal irregularity LEFT proximal internal carotid artery most compatible with atherosclerosis. No flow limiting stenosis. POSTERIOR CIRCULATION: Bilateral vertebral arteries are patent to the vertebrobasilar junction. No flow limiting stenosis or luminal irregularity. Moderate stenosis RIGHT mid V2 segment. Source images and MIP images were reviewed. IMPRESSION: MRI HEAD: Acute RIGHT pontine small vessel infarct. Mild to moderate chronic small vessel ischemic disease. Old small RIGHT frontal lobe infarcts. MRA HEAD: No emergent large vessel occlusion. 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 Moderate stenosis RIGHT supraclinoid internal carotid artery and distal basilar artery compatible with atherosclerosis. MRA NECK: Limited time-of-flight technique. Moderate stenosis RIGHT V2 segment. No hemodynamically significant stenosis of the carotid artery's. Electronically Signed   By: Elon Alas M.D.   On: 01/24/2017 05:17     EKG: Independently reviewed. Rate 83, QTc 444, lateral TWI, no St changes.    Assessment/Plan Principal Problem:   Acute ischemic stroke [facility]) Active Problems:   Type 2 diabetes mellitus with  hyperlipidemia ([place])   Essential hypertension   Hypothyroidism (acquired)  1. Acute Stroke:  This is new.  MRI shows new pontine infarct.  MRA neck shows some stenosis in basilar arteries, carotids without significant stenosis.   -Admit to telemetry -Neuro checks, NIHSS per protocol -Daily aspirin 325 mg -2 days out from event, will continue BP medications -Lipids, hemoglobin A1c -Echocardiogram  ordered -PT/OT/SLP consultation -Consult to Neurology, appreciate recommendations -Continue Crestor -Stop baby aspirin   2. Diabetes:  -Continue Lantus 66 units -SSI with meals  3. HTN:  -Continue HCTZ, lisinopril, metoprolol  4. Hypothyroidism:  Stable.  -Continue levothyroxine  5. Other medications:  -Continue Flomax         DVT prophylaxis: Lovenox  Code Status: FULL  Family Communication: Wife at bedside  Disposition Plan: Anticipate Stroke work up as above and consult to ancillary services.  Expect discharge within 2 days. Consults called: Neurology, Dr. Leonel Ramsay has seen patient. Admission status: Telemetry, INPATIENT status  Core measures: -VTE prophylaxis ordered at time of admission -Aspirin ordered at admission -Atrial fibrillation: not knonw -tPA not given because of outside stroke window -Dysphagia screen ordered in ER -Lipids ordered -PT eval ordered -Nonsmoker    Medical decision making: Patient seen at 6:48 AM on 01/24/2017.  The patient was discussed with Dr. Leonides Schanz. What exists of the patient's chart was reviewed in depth and outside records were reviewed and summarized above.  Clinical condition: stable.       Edwin Dada [facility] Pager [phone]

## 2017-01-24 NOTE — Progress Notes (Signed)
Pt transported from ED to [place].  Patient vitals are stable and patient reports no pain.  Patient and wife were oriented to room, floor, and hospital policy.  Patient has no further questions at this time.

## 2017-01-24 NOTE — Care Management Note (Signed)
Case Management Note  Patient Details  Name: Roberto Ingram MRN: [id number] Date of Birth: February 02, 1951  Subjective/Objective:                    Action/Plan: Pt discharging home with self care. Pt with orders for 3 in 1. Brad with [facility] DME notified and will deliver the equipment to the room. CM consulted for outpatient therapy. Patient would like to go to therapy in [place] at [facility]. CM informed MD and he wrote out prescription for PT and CM provided it to the patients wife.  Patients wife to provide transportation home.   Expected Discharge Date:  01/24/17               Expected Discharge Plan:  Home/Self Care  In-House Referral:     Discharge planning Services  CM Consult  Post Acute Care Choice:    Choice offered to:  Patient, Spouse  DME Arranged:  3-N-1 DME Agency:  [place]:    [facility] Agency:     Status of Service:  Completed, signed off  If discussed at [place] of Stay Meetings, dates discussed:    Additional Comments:  Pollie Friar, RN 01/24/2017, 3:54 PM

## 2017-01-24 NOTE — Care Management Note (Signed)
Case Management Note  Patient Details  Name: Roberto Ingram MRN: [id number] Date of Birth: 05-24-1951  Subjective/Objective:  Pt admitted with CVA. He is from home with his spouse.              Action/Plan: Awaiting PT/OT recommendations. CM following for d/c needs, physician orders.  Expected Discharge Date:                  Expected Discharge Plan:     In-House Referral:     Discharge planning Services     Post Acute Care Choice:    Choice offered to:     DME Arranged:    DME Agency:     HH Arranged:    HH Agency:     Status of Service:  In process, will continue to follow  If discussed at Long Length of Stay Meetings, dates discussed:    Additional Comments:  Pollie Friar, RN 01/24/2017, 11:31 AM

## 2017-01-24 NOTE — Progress Notes (Signed)
Inpatient Diabetes Program Recommendations  AACE/ADA: New Consensus Statement on Inpatient Glycemic Control (2015)  Target Ranges:  Prepandial:   less than 140 mg/dL      Peak postprandial:   less than 180 mg/dL (1-2 hours)      Critically ill patients:  140 - 180 mg/dL   Lab Results  Component Value Date   GLUCAP 240 (H) 01/24/2017    Review of Glycemic Control  Diabetes history: DM Outpatient Diabetes medications: Lantus 66 units QHS (taken last on 3/14 evening), Novolog 12-26 units TID AC (patient states he often takes 20 units with 2 meals per day) Current orders for Inpatient glycemic control: Lantus 66 units daily QHS, moderate correction scale Novolog 0-15 units TIDAC and 0-5 units QHS  Inpatient Diabetes Program Recommendations:   Insulin - Basal: Patient states that he had experienced a low FBG yesterday of either 58 or 68 mg/dL.  Please consider decreasing Lantus to 50 units daily.  HgbA1C: A1C ordered today.  Noted patient is followed by Dr Gabriel Carina (Endocrinologist) and last seen by her on 01/01/2017.  Thank you,  Windy Carina, RN, MSN Diabetes Coordinator Inpatient Diabetes Program [phone] (Team Pager)

## 2017-01-24 NOTE — Evaluation (Signed)
Physical Therapy Evaluation Patient Details Name: Roberto Ingram MRN: [id number] DOB: August 08, 1951 Today's Date: 01/24/2017   History of Present Illness  Pt is a 66 y.o male who presents with slurred speech, LLE weakness, and ataxia x3 days. MRI revealed acute pontine stroke. PMH includes IDDM, HTN, hypothyroidism.   Clinical Impression  Pt admitted with above diagnosis. Pt currently with functional limitations due to the deficits listed below (see PT Problem List). At the time of PT eval pt was able to perform transfers with supervision to mod I and ambulation/stairs with min guard assist. Pt occasionally unsteady however was able to recover without assistance. Pt will benefit from skilled PT to increase their independence and safety with mobility to allow discharge to the venue listed below.       Follow Up Recommendations Outpatient PT    Equipment Recommendations  3in1 (PT)    Recommendations for Other Services       Precautions / Restrictions Precautions Precautions: Fall Restrictions Weight Bearing Restrictions: No      Mobility  Bed Mobility Overal bed mobility: Modified Independent             General bed mobility comments: Light use of rails. No assist required.   Transfers Overall transfer level: Needs assistance Equipment used: None Transfers: Sit to/from Stand Sit to Stand: Supervision         General transfer comment: Supervision for safety. No unsteadiness noted however increased time was taken standing EOB before initiating ambulation.   Ambulation/Gait Ambulation/Gait assistance: Min guard Ambulation Distance (Feet): 400 Feet Assistive device: None Gait Pattern/deviations: Step-through pattern;Decreased stride length;Trunk flexed Gait velocity: Decreased Gait velocity interpretation: Below normal speed for age/gender General Gait Details: VC's for improved posture. Pt occasionally appeared off balance but did not require assistance to recover.  Pt was able to complete some higher level balance tasks with min guard assist as well.   Stairs Stairs: Yes Stairs assistance: Min guard Stair Management: Two rails;Alternating pattern;Forwards Number of Stairs: 5 General stair comments: VC's for general safety  Wheelchair Mobility    Modified Rankin (Stroke Patients Only) Modified Rankin (Stroke Patients Only) Pre-Morbid Rankin Score: No symptoms Modified Rankin: No significant disability     Balance Overall balance assessment: Needs assistance Sitting-balance support: Feet supported;No upper extremity supported Sitting balance-Leahy Scale: Good     Standing balance support: No upper extremity supported;During functional activity Standing balance-Leahy Scale: Fair               High level balance activites: Direction changes;Turns;Head turns;Sudden stops   Standardized Balance Assessment Standardized Balance Assessment : Dynamic Gait Index   Dynamic Gait Index Level Surface: Mild Impairment Change in Gait Speed: Moderate Impairment Gait with Horizontal Head Turns: Moderate Impairment Gait with Vertical Head Turns: Moderate Impairment Gait and Pivot Turn: Mild Impairment Step Over Obstacle: Mild Impairment Step Around Obstacles: Mild Impairment Steps: Mild Impairment Total Score: 13       Pertinent Vitals/Pain Pain Assessment: No/denies pain    Home Living Family/patient expects to be discharged to:: Private residence Living Arrangements: Spouse/significant other Available Help at Discharge: Family;Available 24 hours/day Type of Home: House Home Access: Stairs to enter;Ramped entrance Entrance Stairs-Rails: Right;Left;Can reach both Entrance Stairs-Number of Steps: 7 in front, ramp in back Home Layout: One level Home Equipment: None      Prior Function Level of Independence: Independent               Hand Dominance  Extremity/Trunk Assessment   Upper Extremity Assessment Upper  Extremity Assessment: LUE deficits/detail;Defer to OT evaluation    Lower Extremity Assessment Lower Extremity Assessment: LLE deficits/detail LLE Deficits / Details: Decreased strength in the LLE however overall still grossly 4/5 LLE Coordination: decreased gross motor (Heel to shin very shaky and zig-zag)    Cervical / Trunk Assessment Cervical / Trunk Assessment: Other exceptions Cervical / Trunk Exceptions: Forward head/rounded shoulders  Communication   Communication: No difficulties  Cognition Arousal/Alertness: Awake/alert Behavior During Therapy: Flat affect Overall Cognitive Status: Within Functional Limits for tasks assessed                      General Comments      Exercises     Assessment/Plan    PT Assessment Patient needs continued PT services  PT Problem List Decreased strength;Decreased range of motion;Decreased activity tolerance;Decreased balance;Decreased mobility;Decreased knowledge of use of DME;Decreased safety awareness;Decreased knowledge of precautions;Decreased coordination       PT Treatment Interventions DME instruction;Gait training;Stair training;Functional mobility training;Therapeutic activities;Therapeutic exercise;Neuromuscular re-education;Patient/family education    PT Goals (Current goals can be found in the Care Plan section)  Acute Rehab PT Goals Patient Stated Goal: Pt did not state goals PT Goal Formulation: With patient/family Time For Goal Achievement: 01/31/17 Potential to Achieve Goals: Good    Frequency Min 4X/week   Barriers to discharge        Co-evaluation               End of Session Equipment Utilized During Treatment: Gait belt Activity Tolerance: Patient tolerated treatment well Patient left: in chair;with call bell/phone within reach;with chair alarm set;with family/visitor present Nurse Communication: Mobility status PT Visit Diagnosis: Other abnormalities of gait and mobility (R26.89)          Time: 1113-1140 PT Time Calculation (min) (ACUTE ONLY): 27 min   Charges:   PT Evaluation $PT Eval Moderate Complexity: 1 Procedure PT Treatments $Gait Training: 8-22 mins   PT G Codes:         Thelma Comp 01/24/2017, 3:05 PM   Rolinda Roan, PT, DPT Acute Rehabilitation Services Pager: [phone]

## 2017-01-24 NOTE — ED Notes (Signed)
Patient transported to MRI 

## 2017-01-24 NOTE — Consult Note (Signed)
Neurology Consultation Reason for Consult: Pontine stroke Referring Physician: Ward, K  CC: unsteady gait  History is obtained from: Patient call if  HPI: Roberto Ingram is a 66 y.o. male with a history of diabetes, coronary artery disease presents with trouble walking for the past few days. States that he was walking on Wednesday when all of a sudden he became slightly unsteady. He got worse that night. Since then he has been gradually getting better.  His wife states that he is also been slurring his speech. Due to discontinuing, they sought care in the emergency department yesterday evening and MRI was ordered which demonstrates a pontine infarct.   LKW: 3/14 tpa given?: no, out of window   ROS: A 14 point ROS was performed and is negative except as noted in the HPI.   Past Medical History:  Diagnosis Date  . Coronary artery disease   . Diabetes mellitus without complication ([place])   . Hypertension   . Renal disorder   . Thyroid disease      Family History  Problem Relation Age of Onset  . Atrial fibrillation Mother   . Heart disease Mother   . Stroke Mother   . Diabetes Father   . Stroke Brother   . Diabetes Brother   . Lung cancer Brother   . Heart attack Brother      Social History:  reports that he quit smoking about 23 years ago. His smoking use included Cigarettes. He has never used smokeless tobacco. He reports that he uses drugs, including "Crack" cocaine. He reports that he does not drink alcohol.   Exam: Current vital signs: BP (!) 172/78 (BP Location: Right Arm)   Pulse 84   Temp 98 F (36.7 C)   Resp 18   Ht 5\' 11"  (1.803 m)   Wt 111.6 kg (246 lb)   SpO2 98%   BMI 34.31 kg/m  Vital signs in last 24 hours: Temp:  [97.8 F (36.6 C)-98.6 F (37 C)] 98 F (36.7 C) (03/16 0530) Pulse Rate:  [57-84] 84 (03/16 0530) Resp:  [13-23] 18 (03/16 0530) BP: (119-197)/(52-90) 172/78 (03/16 0530) SpO2:  [90 %-100 %] 98 % (03/16 0530) Weight:   [111.6 kg (246 lb)] 111.6 kg (246 lb) (03/15 2119)   Physical Exam  Constitutional: Appears well-developed and well-nourished.  Psych: Affect appropriate to situation Eyes: No scleral injection HENT: No OP obstrucion Head: Normocephalic.  Cardiovascular: Normal rate and regular rhythm.  Respiratory: Effort normal and breath sounds normal to anterior ascultation GI: Soft.  No distension. There is no tenderness.  Skin: WDI  Neuro: Mental Status: Patient is awake, alert, oriented to person, place, month, year, and situation. Patient is able to give a clear and coherent history. No signs of aphasia or neglect Cranial Nerves: II: Visual Fields are full. Pupils are equal, round, and reactive to light.   III,IV, VI: EOMI without ptosis or diploplia.  V: Facial sensation is symmetric to temperature VII: Facial movement is slightly decreased on the left VIII: hearing is intact to voice X: Uvula elevates symmetrically XI: Shoulder shrug is symmetric. XII: tongue is midline without atrophy or fasciculations.  Motor: Tone is normal. Bulk is normal. 5/5 strength was present confrontation in all four extremities. ? Mild left pronator drift Sensory: Sensation is symmetric to light touch and temperature in the arms and legs. Deep Tendon Reflexes: 2+ and symmetric in the biceps and patellae.  Plantars: Toes are downgoing bilaterally.  Cerebellar: He has mild difficulty  with finger-nose-finger and heel-knee-shin on the left.    I have reviewed labs in epic and the results pertinent to this consultation are: Mildly elevated creatinine, elevated glucose  I have reviewed the images obtained: MRI brain-pontine infarct  Impression: 66 year old male with multiple small vessel risk factors and likely small vessel infarct. He is being admitted for physical therapy and further evaluation.  Recommendations: 1. HgbA1c 2.  fasting lipid panel 3. Frequent neuro checks 4. Echocardiogram 5.  Prophylactic therapy-Antiplatelet med: Aspirin - dose 325mg  PO or 300mg  PR 6. Risk factor modification 7. Telemetry monitoring 8. PT consult, OT consult, Speech consult 9. please page stroke NP  Or  PA  Or MD  from 8am -4 pm starting 3/16 as this patient will be followed by the stroke team at this point.   You can look them up on [email or web address]      Roland Rack, MD [facility] [phone]  If 7pm- 7am, please page neurology on call as listed in [place].

## 2017-01-24 NOTE — Progress Notes (Addendum)
STROKE TEAM PROGRESS NOTE   HISTORY OF PRESENT ILLNESS (per record) Roberto Ingram is a 66 y.o. male with a history of diabetes, coronary artery disease presents with trouble walking for the past few days. States that he was walking on Wednesday when all of a sudden he became slightly unsteady. He got worse that night. Since then he has been gradually getting better.  His wife states that he is also been slurring his speech. Due to discontinuing, they sought care in the emergency department yesterday evening and MRI was ordered which demonstrates a pontine infarct.   LKW: 3/14 tpa given?: no, out of window  SUBJECTIVE (INTERVAL HISTORY) His wife is at the bedside.  Pt still has subtle left hand weakness but otherwise neuro intact. Admitted that glucose at home not in good control. Take crestor only 20mg  not 40mg  as instructed.    OBJECTIVE Temp:  [97.3 F (36.3 C)-98.6 F (37 C)] 97.3 F (36.3 C) (03/16 1244) Pulse Rate:  [57-84] 61 (03/16 1244) Cardiac Rhythm: Normal sinus rhythm (03/16 0700) Resp:  [13-23] 18 (03/16 1244) BP: (119-197)/(52-90) 140/83 (03/16 1244) SpO2:  [90 %-100 %] 98 % (03/16 1244) Weight:  [110.9 kg (244 lb 8 oz)-111.6 kg (246 lb)] 110.9 kg (244 lb 8 oz) (03/16 0644)  CBC:   Recent Labs Lab 01/23/17 2125  WBC 11.9*  HGB 12.7*  HCT 38.6*  MCV 88.1  PLT 267    Basic Metabolic Panel:   Recent Labs Lab 01/23/17 2125  NA 135  K 4.2  CL 102  CO2 22  GLUCOSE 260*  BUN 25*  CREATININE 1.58*  CALCIUM 9.3    Lipid Panel:     Component Value Date/Time   CHOL 147 01/24/2017 0748   TRIG 74 01/24/2017 0748   HDL 41 01/24/2017 0748   CHOLHDL 3.6 01/24/2017 0748   VLDL 15 01/24/2017 0748   LDLCALC 91 01/24/2017 0748   HgbA1c: No results found for: HGBA1C Urine Drug Screen: No results found for: LABOPIA, COCAINSCRNUR, LABBENZ, AMPHETMU, THCU, LABBARB    IMAGING I have personally reviewed the radiological images below and agree with the  radiology interpretations.  Mr Roberto Ingram Head Wo Contrast 01/24/2017 MRI HEAD:  Acute RIGHT pontine small vessel infarct. Mild to moderate chronic small vessel ischemic disease. Old small RIGHT frontal lobe infarcts.  MRA HEAD:  No emergent large vessel occlusion.  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  Moderate stenosis RIGHT supraclinoid internal carotid artery and distal basilar artery compatible with atherosclerosis.  MRA NECK:  Limited time-of-flight technique. Moderate stenosis RIGHT V2 segment. No hemodynamically significant stenosis of the carotid artery's.   TTE - Normal LV size with mild LV hypertrophy. EF 60-65%. Normal RV   size and systolic function. Mild aortic insufficiency, mild   mitral regurgitation.   PHYSICAL EXAM  Temp:  [97.3 F (36.3 C)-98.1 F (36.7 C)] 97.7 F (36.5 C) (03/16 1444) Pulse Rate:  [58-77] 63 (03/16 1444) Resp:  [18] 18 (03/16 1444) BP: (123-181)/(72-83) 123/73 (03/16 1444) SpO2:  [98 %-100 %] 98 % (03/16 1444)  General - Well nourished, well developed, in no apparent distress.  Ophthalmologic - Sharp disc margins OU.   Cardiovascular - Regular rate and rhythm.  Mental Status -  Level of arousal and orientation to time, place, and person were intact. Language including expression, naming, repetition, comprehension was assessed and found intact. Fund of Knowledge was assessed and was intact.  Cranial Nerves II - XII - II - Visual field  intact OU. III, IV, VI - Extraocular movements intact. V - Facial sensation intact bilaterally. VII - Facial movement intact bilaterally. VIII - Hearing & vestibular intact bilaterally. X - Palate elevates symmetrically. XI - Chin turning & shoulder shrug intact bilaterally. XII - Tongue protrusion intact.  Motor Strength - The patient's strength was normal in all extremities except mild decreased left hand grip and pronator drift was absent.  Bulk was normal and fasciculations were absent.    Motor Tone - Muscle tone was assessed at the neck and appendages and was normal.  Reflexes - The patient's reflexes were 1+ in all extremities and he had no pathological reflexes.  Sensory - Light touch, temperature/pinprick were assessed and were symmetrical.    Coordination - The patient had normal movements in the hands and feet with no ataxia or dysmetria.  Tremor was absent.  Gait and Station - deferred   ASSESSMENT/PLAN [name] is a 66 y.o. male with history of hypertension, diabetes mellitus, renal disease, substance abuse, and coronary artery disease presenting with difficulty walking and slurred speech. He did not receive IV t-PA due to late presentation.  Stroke:  Acute RIGHT pontine infarct, likely due to small vessel disease.  Resultant  Mild left hand grip weakness  MRI - Acute RIGHT pontine small vessel infarct. Old small RIGHT frontal lobe infarcts.   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  2D Echo EF 60-65%  LDL - 91  HgbA1c - pending  VTE prophylaxis - Lovenox Diet Carb Modified Fluid consistency: Thin; Room service appropriate? Yes  aspirin 81 mg daily prior to admission, now on aspirin 325 mg daily and clopidogrel 75 mg daily. Continue DAPT for 3 months and then either ASA or plavix alone due to intracranial stenosis.  Patient counseled to be compliant with his antithrombotic medications  Ongoing aggressive stroke risk factor management  Therapy recommendations: pending  Disposition: Pending  Hypertension  Stable  Permissive hypertension (OK if < 220/120) but gradually normalize in 5-7 days  Long-term BP goal normotensive  Hyperlipidemia  Home meds:  Crestor 20 mg daily resumed in hospital  LDL 91, goal < 70  Increase crestor to 40mg    Continue statin at discharge  Diabetes  HgbA1c pending, goal < 7.0  Uncontrolled  SSI  Close PCP follow up  Other Stroke Risk  Factors  Advanced age  Former smoker  Obesity, Body mass index is 34.1 kg/m., recommend weight loss, diet and exercise as appropriate   CAD  Hx of substance abuse - "crack cocaine"  ? 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 Please call with questions. Pt will follow up with Cecille Rubin NP at [facility] in about 6 weeks. Thanks for the consult.  Rosalin Hawking, MD PhD Stroke Neurology 01/25/2017 7:41 AM   To contact Stroke Continuity provider, please refer to [email or web address] After hours, contact General Neurology

## 2017-01-25 DIAGNOSIS — I6302 Cerebral infarction due to thrombosis of basilar artery: Secondary | ICD-10-CM

## 2017-01-27 LAB — HEMOGLOBIN A1C
Hgb A1c MFr Bld: 10.7 % — ABNORMAL HIGH (ref 4.8–5.6)
Mean Plasma Glucose: 260 mg/dL

## 2017-01-27 NOTE — Discharge Summary (Addendum)
[facility] Discharge Summary   Patient: Roberto Ingram YHC:[id number]   PCP: Renee Rival, NP DOB: December 24, 1950   Date of admission: 01/24/2017   Date of discharge: 01/24/2017    Discharge Diagnoses:  Principal Problem:   Acute ischemic stroke [facility]) Active Problems:   Type 2 diabetes mellitus with hyperlipidemia ([place])   Essential hypertension   Hypothyroidism (acquired)   Cerebrovascular accident (CVA) due to thrombosis of basilar artery ([place])   Admitted From: Home Disposition:  Home with outpatient therapy  Recommendations for Outpatient Follow-up:  1. Follow-up with PCP in one week. 2. Follow-up with neurology in 6 weeks.   Follow-up Information    Renee Rival, NP. Schedule an appointment as soon as possible for a visit in 1 week(s).   Specialty:  Nurse Practitioner Contact information: [address] [place] [address] [phone]        [facility] MRI Follow up.   Specialty:  Radiology Contact information: [address] [id number] [place] [facility] Follow up.   Specialty:  Cardiology Contact information: [address] [id number] mc [place], NP. Schedule an appointment as soon as possible for a visit in 6 week(s).   Specialty:  Family Medicine Contact information: [address] [place] [address] [phone]          Diet recommendation: Heart modified cardiac diet  Activity: The patient is advised to gradually reintroduce usual activities.  Discharge Condition: good  Code Status: full code  History of present illness: As per the H and P dictated on admission, "Roberto Ingram is a 66 y.o. male with a past medical history significant for IDDM, HTN, hypothyroidism who presents with slurred speech, leg weakness and ataxia for  3 days.  The patient was in his usual state of health until about 2 days ago when he had sudden onset of sensation of being "off balance", as well as heaviness of his left leg, and slurred speech. He was seen by his PCP, who ordered CT of the head, and then prescribed meclizine after that. Today symptoms seem to be getting worse, so he came to the emergency room."  Hospital Course:  Summary of his active problems in the hospital is as following. 1. Acute Stroke:  This is new.  MRI shows new pontine infarct.  MRA neck shows some stenosis in basilar arteries, carotids without significant stenosis.   No further events on telemetry at present -2 days out from event, will continue BP medications -Lipids LDL mildly elevated, continue Crestor, hemoglobin A1c pending -Echocardiogram unremarkable -PT/OT/SLP consultation was performed, outpatient therapy recommended, no speech therapy requirement. -Consult to Neurology, appreciate recommendations -Continue Crestor patient only takes 20 mg daily, recommended take 40 mg daily. -Aspirin and Plavix combination for 3 months followed by Plavix alone. Patient will follow-up with neurology in the timeframe to decide further medication adjustment  2. Diabetes:  -Continue Lantus 66 units -SSI with meals  3. HTN:  -Continue HCTZ, lisinopril, metoprolol  4. Hypothyroidism:  Stable.  -Continue levothyroxine  5. Other medications:  -Continue Flomax Patient's neurological symptoms improved rapidly as well as his blood pressure also improved rapidly. With this rapid improvement as well as no further workup planned it was felt the patient can safely be discharged.  All other chronic medical condition were stable during the hospitalization.  Patient was seen by physical therapy, who recommended outpatient therapy, which was arranged by social worker and case Freight forwarder. On the day of the discharge the patient's home, and no other acute medical condition  were reported by patient. the patient was felt safe to be discharge at home with therapy.  Procedures and Results:  Echocardiogram   Consultations:  Neurology  DISCHARGE MEDICATION: Discharge Medication List as of 01/24/2017  4:30 PM    START taking these medications   Details  aspirin 325 MG tablet Take 1 tablet (325 mg total) by mouth daily., Starting Sat 01/25/2017, Normal    clopidogrel (PLAVIX) 75 MG tablet Take 1 tablet (75 mg total) by mouth daily., Starting Sat 01/25/2017, Normal      CONTINUE these medications which have NOT CHANGED   Details  albuterol (PROVENTIL HFA;VENTOLIN HFA) 108 (90 Base) MCG/ACT inhaler Inhale 2 puffs into the lungs every 6 (six) hours as needed for wheezing or shortness of breath. , Historical Med    ALPRAZolam (XANAX) 0.5 MG tablet Take 0.5 mg by mouth at bedtime as needed for sleep. , Historical Med    amLODipine (NORVASC) 5 MG tablet Take 5 mg by mouth daily. , Historical Med    budesonide (PULMICORT) 180 MCG/ACT inhaler Inhale 1 puff into the lungs 2 (two) times daily as needed (for shortness of breath). , Historical Med    fluticasone (FLONASE) 50 MCG/ACT nasal spray Place 2 sprays into both nostrils daily as needed for allergies or rhinitis., Historical Med    hydrochlorothiazide (HYDRODIURIL) 25 MG tablet Take 25 mg by mouth daily. , Historical Med    insulin glargine (LANTUS) 100 UNIT/ML injection Inject 66 Units into the skin daily. , Historical Med    insulin regular (NOVOLIN R,HUMULIN R) 100 units/mL injection Inject 12 to 26 units subcutaneously three times a day before each meal per sliding scale, Historical Med    levothyroxine (SYNTHROID) 50 MCG tablet Take 50 mcg by mouth daily before breakfast. , Starting Wed 01/12/2014, Historical Med    lisinopril (PRINIVIL,ZESTRIL) 40 MG tablet Take 40 mg by mouth daily. , Historical Med    metoprolol tartrate (LOPRESSOR) 25 MG tablet Take 25 mg by mouth 2 (two) times daily. , Starting Mon  03/28/2014, Historical Med    nitroGLYCERIN (NITROSTAT) 0.4 MG SL tablet Place 0.4 mg under the tongue every 5 (five) minutes as needed for chest pain. , Historical Med    Omega-3 Fatty Acids (FISH OIL) 1000 MG CAPS Take 1,000 mg by mouth daily. , Historical Med    rosuvastatin (CRESTOR) 40 MG tablet Take 40 mg by mouth daily. , Starting Tue 03/29/2014, Historical Med    tamsulosin (FLOMAX) 0.4 MG CAPS capsule Take 0.4 mg by mouth daily after supper. , Historical Med    Vitamin D, Ergocalciferol, (DRISDOL) 50000 units CAPS capsule Take 1 tablet by mouth twice each week, Historical Med       Allergies  Allergen Reactions  . Diphenhydramine Hcl Other (See Comments)    "The more I take, it makes my prostate swell..."  . Ezetimibe Other (See Comments)    Unknown  . Lopressor [Metoprolol] Itching  . Metformin Other (See Comments)    Unknown  . Other Itching and Other (See Comments)    FA Dye Prostate swelling  . Sulfa Antibiotics Other (See Comments)    Unknown   Discharge Instructions    Ambulatory referral to Neurology    Complete by:  As directed    An appointment  is requested in approximately: 6-8 weeks New CVA, follow up.   Ambulatory referral to Neurology    Complete by:  As directed    Follow up with NP Cecille Rubin at [facility] in about 2 months. Thanks.   Ambulatory referral to Sleep Studies    Complete by:  As directed    Ambulatory referral to Sleep Studies    Complete by:  As directed    Diet - low sodium heart healthy    Complete by:  As directed    Diet Carb Modified    Complete by:  As directed    Discharge instructions    Complete by:  As directed    It is important that you read following instructions as well as go over your medication list with RN to help you understand your care after this hospitalization.  Discharge Instructions: Please follow-up with PCP in one week  Please request your primary care physician to go over all Hospital Tests and  Procedure/Radiological results at the follow up,  Please get all Hospital records sent to your PCP by signing hospital release before you go home.   Do not drive, operating heavy machinery, perform activities at heights, swimming or participation in water activities or provide baby sitting services; until you have been seen by Primary Care Physician or a Neurologist and advised to do so again. Do not take more than prescribed Pain, Sleep and Anxiety Medications. You were cared for by a hospitalist during your hospital stay. If you have any questions about your discharge medications or the care you received while you were in the hospital after you are discharged, you can call the unit and ask to speak with the hospitalist on call if the hospitalist that took care of you is not available.  Once you are discharged, your primary care physician will handle any further medical issues. Please note that NO REFILLS for any discharge medications will be authorized once you are discharged, as it is imperative that you return to your primary care physician (or establish a relationship with a primary care physician if you do not have one) for your aftercare needs so that they can reassess your need for medications and monitor your lab values. You Must read complete instructions/literature along with all the possible adverse reactions/side effects for all the Medicines you take and that have been prescribed to you. Take any new Medicines after you have completely understood and accept all the possible adverse reactions/side effects. Wear Seat belts while driving. If you have smoked or chewed Tobacco in the last 2 yrs please stop smoking and/or stop any Recreational drug use.   Driving Restrictions    Complete by:  As directed    Do not drive until cleared by PCP.   Increase activity slowly    Complete by:  As directed      Discharge Exam: Filed Weights   01/23/17 2119 01/24/17 0644  Weight: 111.6 kg (246 lb)  110.9 kg (244 lb 8 oz)   Vitals:   01/24/17 1244 01/24/17 1444  BP: 140/83 123/73  Pulse: 61 63  Resp: 18 18  Temp: 97.3 F (36.3 C) 97.7 F (36.5 C)   General: Appear in no distress, no Rash; Oral Mucosa moist. Cardiovascular: S1 and S2 Present, no Murmur, no JVD Respiratory: Bilateral Air entry present and Clear to Auscultation, no Crackles, no wheezes Abdomen: Bowel Sound present, Soft and no tenderness Extremities: no Pedal edema, no calf tenderness Neurology: Grossly no focal neuro deficit.  The results of significant diagnostics from this hospitalization (including imaging, microbiology, ancillary and laboratory) are listed below for reference.    Significant Diagnostic Studies: Mr Virgel Paling UX Contrast  Result Date: 01/24/2017 CLINICAL DATA:  Gradually worsening dizziness and gait imbalance for 2 days. Slurred speech. History of hypertension and diabetes. Follow-up abnormal CT HEAD. EXAM: MRI HEAD WITHOUT CONTRAST MRA HEAD WITHOUT CONTRAST MRA NECK WITHOUT CONTRAST TECHNIQUE: Multiplanar, multiecho pulse sequences of the brain and surrounding structures were obtained without intravenous contrast. Angiographic images of the Circle of Willis were obtained using MRA technique without intravenous contrast. Angiographic images of the neck were obtained using MRA technique without intravenous contrast. Carotid stenosis measurements (when applicable) are obtained utilizing NASCET criteria, using the distal internal carotid diameter as the denominator. COMPARISON:  None. FINDINGS: MRI HEAD FINDINGS BRAIN: RIGHT pontine 28 x 8 mm (AP by transverse) reduced diffusion with low ADC values. No susceptibility artifact to suggest hemorrhage. The ventricles and sulci are normal for patient's age, cavum septum pellucidum et vergae. Patchy supratentorial pontine white matter FLAIR T2 hyperintensities exclusive of the aforementioned abnormality. Patchy small areas RIGHT frontal encephalomalacia. No  suspicious parenchymal signal, masses or mass effect. No abnormal extra-axial fluid collections. VASCULAR: Normal major intracranial vascular flow voids present at skull base. SKULL AND UPPER CERVICAL SPINE: No abnormal sellar expansion. No suspicious calvarial bone marrow signal. Craniocervical junction maintained. SINUSES/ORBITS: Paranasal sinus mucosal thickening with maxillary mucosal retention cysts, status post FESS. The included ocular globes and orbital contents are non-suspicious. Status post bilateral ocular lens implants. OTHER: None. MRA HEAD FINDINGS ANTERIOR CIRCULATION: Flow related enhancement bilateral internal carotid arteries. Moderate stenosis RIGHT supraclinoid internal carotid artery. Mild stenosis RIGHT A1 segment. Bilateral A1 segment and anterior communicating artery are patent. Patent bilateral anterior cerebral arteries. Mild stenosis RIGHT M1 segment, bilateral middle cerebral arteries are patent. No aneurysm, severe stenosis, emergent large vessel occlusion or abnormal luminal irregularity. POSTERIOR CIRCULATION: RIGHT vertebral artery dominant. Flow related enhancement bilateral vertebral artery's. 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 Moderate stenosis distal basilar artery. Patent main branch vessels. Patent bilateral posterior cerebral arteries. ANATOMIC VARIANTS: None. MRA NECK FINDINGS- time-of-flight technique, arch vessel origins not characterized. ANTERIOR CIRCULATION: The common carotid arteries are widely patent bilaterally. The carotid bifurcations are patent bilaterally without hemodynamically significant stenosis by NASCET criteria. Mild luminal irregularity LEFT proximal internal carotid artery most compatible with atherosclerosis. No flow limiting stenosis. POSTERIOR CIRCULATION: Bilateral vertebral arteries are patent to the vertebrobasilar junction. No flow limiting stenosis or luminal irregularity. Moderate stenosis  RIGHT mid V2 segment. Source images and MIP images were reviewed. IMPRESSION: MRI HEAD: Acute RIGHT pontine small vessel infarct. Mild to moderate chronic small vessel ischemic disease. Old small RIGHT frontal lobe infarcts. MRA HEAD: No emergent large vessel occlusion. 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 Moderate stenosis RIGHT supraclinoid internal carotid artery and distal basilar artery compatible with atherosclerosis. MRA NECK: Limited time-of-flight technique. Moderate stenosis RIGHT V2 segment. No hemodynamically significant stenosis of the carotid artery's. Electronically Signed   By: Elon Alas M.D.   On: 01/24/2017 05:17   Mr Jodene Nam Neck Wo Contrast  Result Date: 01/24/2017 CLINICAL DATA:  Gradually worsening dizziness and gait imbalance for 2 days. Slurred speech. History of hypertension and diabetes. Follow-up abnormal CT HEAD. EXAM: MRI HEAD WITHOUT CONTRAST MRA HEAD WITHOUT CONTRAST MRA NECK WITHOUT CONTRAST TECHNIQUE: Multiplanar, multiecho pulse sequences of the brain and surrounding structures were obtained without intravenous contrast. Angiographic images of the  Circle of Willis were obtained using MRA technique without intravenous contrast. Angiographic images of the neck were obtained using MRA technique without intravenous contrast. Carotid stenosis measurements (when applicable) are obtained utilizing NASCET criteria, using the distal internal carotid diameter as the denominator. COMPARISON:  None. FINDINGS: MRI HEAD FINDINGS BRAIN: RIGHT pontine 28 x 8 mm (AP by transverse) reduced diffusion with low ADC values. No susceptibility artifact to suggest hemorrhage. The ventricles and sulci are normal for patient's age, cavum septum pellucidum et vergae. Patchy supratentorial pontine white matter FLAIR T2 hyperintensities exclusive of the aforementioned abnormality. Patchy small areas RIGHT frontal encephalomalacia. No suspicious parenchymal signal, masses or mass effect.  No abnormal extra-axial fluid collections. VASCULAR: Normal major intracranial vascular flow voids present at skull base. SKULL AND UPPER CERVICAL SPINE: No abnormal sellar expansion. No suspicious calvarial bone marrow signal. Craniocervical junction maintained. SINUSES/ORBITS: Paranasal sinus mucosal thickening with maxillary mucosal retention cysts, status post FESS. The included ocular globes and orbital contents are non-suspicious. Status post bilateral ocular lens implants. OTHER: None. MRA HEAD FINDINGS ANTERIOR CIRCULATION: Flow related enhancement bilateral internal carotid arteries. Moderate stenosis RIGHT supraclinoid internal carotid artery. Mild stenosis RIGHT A1 segment. Bilateral A1 segment and anterior communicating artery are patent. Patent bilateral anterior cerebral arteries. Mild stenosis RIGHT M1 segment, bilateral middle cerebral arteries are patent. No aneurysm, severe stenosis, emergent large vessel occlusion or abnormal luminal irregularity. POSTERIOR CIRCULATION: RIGHT vertebral artery dominant. Flow related enhancement bilateral vertebral artery's. 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 Moderate stenosis distal basilar artery. Patent main branch vessels. Patent bilateral posterior cerebral arteries. ANATOMIC VARIANTS: None. MRA NECK FINDINGS- time-of-flight technique, arch vessel origins not characterized. ANTERIOR CIRCULATION: The common carotid arteries are widely patent bilaterally. The carotid bifurcations are patent bilaterally without hemodynamically significant stenosis by NASCET criteria. Mild luminal irregularity LEFT proximal internal carotid artery most compatible with atherosclerosis. No flow limiting stenosis. POSTERIOR CIRCULATION: Bilateral vertebral arteries are patent to the vertebrobasilar junction. No flow limiting stenosis or luminal irregularity. Moderate stenosis RIGHT mid V2 segment. Source images and MIP images were  reviewed. IMPRESSION: MRI HEAD: Acute RIGHT pontine small vessel infarct. Mild to moderate chronic small vessel ischemic disease. Old small RIGHT frontal lobe infarcts. MRA HEAD: No emergent large vessel occlusion. 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 Moderate stenosis RIGHT supraclinoid internal carotid artery and distal basilar artery compatible with atherosclerosis. MRA NECK: Limited time-of-flight technique. Moderate stenosis RIGHT V2 segment. No hemodynamically significant stenosis of the carotid artery's. Electronically Signed   By: Elon Alas M.D.   On: 01/24/2017 05:17   Mr Brain Wo Contrast  Result Date: 01/24/2017 CLINICAL DATA:  Gradually worsening dizziness and gait imbalance for 2 days. Slurred speech. History of hypertension and diabetes. Follow-up abnormal CT HEAD. EXAM: MRI HEAD WITHOUT CONTRAST MRA HEAD WITHOUT CONTRAST MRA NECK WITHOUT CONTRAST TECHNIQUE: Multiplanar, multiecho pulse sequences of the brain and surrounding structures were obtained without intravenous contrast. Angiographic images of the Circle of Willis were obtained using MRA technique without intravenous contrast. Angiographic images of the neck were obtained using MRA technique without intravenous contrast. Carotid stenosis measurements (when applicable) are obtained utilizing NASCET criteria, using the distal internal carotid diameter as the denominator. COMPARISON:  None. FINDINGS: MRI HEAD FINDINGS BRAIN: RIGHT pontine 28 x 8 mm (AP by transverse) reduced diffusion with low ADC values. No susceptibility artifact to suggest hemorrhage. The ventricles and sulci are normal for patient's age, cavum septum pellucidum et vergae. Patchy supratentorial pontine white matter  FLAIR T2 hyperintensities exclusive of the aforementioned abnormality. Patchy small areas RIGHT frontal encephalomalacia. No suspicious parenchymal signal, masses or mass effect. No abnormal extra-axial fluid collections. VASCULAR: Normal  major intracranial vascular flow voids present at skull base. SKULL AND UPPER CERVICAL SPINE: No abnormal sellar expansion. No suspicious calvarial bone marrow signal. Craniocervical junction maintained. SINUSES/ORBITS: Paranasal sinus mucosal thickening with maxillary mucosal retention cysts, status post FESS. The included ocular globes and orbital contents are non-suspicious. Status post bilateral ocular lens implants. OTHER: None. MRA HEAD FINDINGS ANTERIOR CIRCULATION: Flow related enhancement bilateral internal carotid arteries. Moderate stenosis RIGHT supraclinoid internal carotid artery. Mild stenosis RIGHT A1 segment. Bilateral A1 segment and anterior communicating artery are patent. Patent bilateral anterior cerebral arteries. Mild stenosis RIGHT M1 segment, bilateral middle cerebral arteries are patent. No aneurysm, severe stenosis, emergent large vessel occlusion or abnormal luminal irregularity. POSTERIOR CIRCULATION: RIGHT vertebral artery dominant. Flow related enhancement bilateral vertebral artery's. 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 Moderate stenosis distal basilar artery. Patent main branch vessels. Patent bilateral posterior cerebral arteries. ANATOMIC VARIANTS: None. MRA NECK FINDINGS- time-of-flight technique, arch vessel origins not characterized. ANTERIOR CIRCULATION: The common carotid arteries are widely patent bilaterally. The carotid bifurcations are patent bilaterally without hemodynamically significant stenosis by NASCET criteria. Mild luminal irregularity LEFT proximal internal carotid artery most compatible with atherosclerosis. No flow limiting stenosis. POSTERIOR CIRCULATION: Bilateral vertebral arteries are patent to the vertebrobasilar junction. No flow limiting stenosis or luminal irregularity. Moderate stenosis RIGHT mid V2 segment. Source images and MIP images were reviewed. IMPRESSION: MRI HEAD: Acute RIGHT pontine small  vessel infarct. Mild to moderate chronic small vessel ischemic disease. Old small RIGHT frontal lobe infarcts. MRA HEAD: No emergent large vessel occlusion. 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 Moderate stenosis RIGHT supraclinoid internal carotid artery and distal basilar artery compatible with atherosclerosis. MRA NECK: Limited time-of-flight technique. Moderate stenosis RIGHT V2 segment. No hemodynamically significant stenosis of the carotid artery's. Electronically Signed   By: Elon Alas M.D.   On: 01/24/2017 05:17    Microbiology: No results found for this or any previous visit (from the past 240 hour(s)).   Labs: CBC:  Recent Labs Lab 01/23/17 2125  WBC 11.9*  HGB 12.7*  HCT 38.6*  MCV 88.1  PLT 353   Basic Metabolic Panel:  Recent Labs Lab 01/23/17 2125  NA 135  K 4.2  CL 102  CO2 22  GLUCOSE 260*  BUN 25*  CREATININE 1.58*  CALCIUM 9.3   CBG:  Recent Labs Lab 01/23/17 2125 01/24/17 0806 01/24/17 1137  GLUCAP 241* 240* 291*   Time spent: 30 minutes  Signed:  Jazline Cumbee  [facility] 01/24/2017 , 10:13 AM

## 2017-01-28 ENCOUNTER — Telehealth: Payer: Self-pay | Admitting: *Deleted

## 2017-01-28 NOTE — Telephone Encounter (Signed)
Pt wife Vivien Rota) called regarding pt having outpatient PT/OT services.  She states they have order for PT, not OT.  [facility] reviewed chart and confirmed with 5CCM that pt was ONLY to receive PT.  EDCM relayed information to [place].  No further CM needs communicated at this time.

## 2017-02-10 ENCOUNTER — Other Ambulatory Visit: Payer: Self-pay

## 2017-02-10 NOTE — Patient Outreach (Signed)
[facility]) Care Management  02/10/2017  Roberto Ingram 1951-08-19 [id number]     EMMI- STROKE RED ON EMMI ALERT Day # 13 Date: 02/09/17 Red Alert Reason: "Went to follow up appt? No"    Outreach attempt #1 to patient. Spoke with patient. Reviewed and addressed red alert. Patient states he responded no as he thought question was referring to f/u appt with neuro office. He has f/u appt with [facility] on 03/10/17. However, patient states that he has already completed PCP f/u appt with the recommended 14 days time frame. He states he is doing well. Denies any issues regarding symptoms, meds or transportation. Patient has supportive spouse to assist with care needs as needed. Denies any RN CM needs or concerns at this time. Patient has completed post discharge EMMI-Stroke calls.     Plan: RN CM will notify [facility] administrative assistant of case status.   Enzo Montgomery, RN,BSN,CCM [place] Management Telephonic Care Management Coordinator Direct Phone: [phone] Toll Free: [phone] Fax: [phone]

## 2017-02-19 ENCOUNTER — Ambulatory Visit (INDEPENDENT_AMBULATORY_CARE_PROVIDER_SITE_OTHER): Payer: Medicare Other | Admitting: Neurology

## 2017-02-19 ENCOUNTER — Encounter: Payer: Self-pay | Admitting: Neurology

## 2017-02-19 VITALS — BP 155/73 | HR 50 | Resp 20 | Ht 72.0 in | Wt 243.0 lb

## 2017-02-19 DIAGNOSIS — G4719 Other hypersomnia: Secondary | ICD-10-CM | POA: Diagnosis not present

## 2017-02-19 DIAGNOSIS — Z8673 Personal history of transient ischemic attack (TIA), and cerebral infarction without residual deficits: Secondary | ICD-10-CM

## 2017-02-19 DIAGNOSIS — I6350 Cerebral infarction due to unspecified occlusion or stenosis of unspecified cerebral artery: Secondary | ICD-10-CM | POA: Diagnosis not present

## 2017-02-19 DIAGNOSIS — E669 Obesity, unspecified: Secondary | ICD-10-CM

## 2017-02-19 DIAGNOSIS — R0683 Snoring: Secondary | ICD-10-CM

## 2017-02-19 DIAGNOSIS — R51 Headache: Secondary | ICD-10-CM

## 2017-02-19 DIAGNOSIS — I639 Cerebral infarction, unspecified: Secondary | ICD-10-CM | POA: Diagnosis not present

## 2017-02-19 DIAGNOSIS — R519 Headache, unspecified: Secondary | ICD-10-CM

## 2017-02-19 DIAGNOSIS — R351 Nocturia: Secondary | ICD-10-CM | POA: Diagnosis not present

## 2017-02-19 NOTE — Patient Instructions (Addendum)

## 2017-02-19 NOTE — Progress Notes (Addendum)
Subjective:    Patient ID: Roberto Ingram is a 66 y.o. male.  HPI     Star Age, MD, PhD [facility] [phone] well [place], [place] [address]  Dear Cornelius Moras,   I saw your patient, Roberto Ingram, upon your kind request in my clinic today for initial consultation of his sleep disorder, in particular, concern for underlying obstructive sleep apnea. The patient is accompanied by his wife today. 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 I reviewed his hospital records including discharge summary and imaging test results. He had a brain MRI without contrast as well as MRA head and MRA a neck which showed: MRI HEAD: Acute RIGHT pontine small vessel infarct. Mild to moderate chronic small vessel ischemic disease. Old small RIGHT frontal lobe infarcts.   MRA HEAD: No emergent large vessel occlusion. 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 Moderate stenosis RIGHT supraclinoid internal carotid artery and distal basilar artery compatible with atherosclerosis.   MRA NECK: Limited time-of-flight technique. Moderate stenosis RIGHT V2 segment. No hemodynamically significant stenosis of the carotid artery's.  2D echo was benign on 01/24/2017, EF of 60-65%, normal wall motion, mild LVH.  His Epworth sleepiness score is 17 out of 24, fatigue score is 35 out of 63. He has 3 grown children. He lives with his wife. He quit smoking in 1994, he does not drink alcohol on a regular basis and does not take illicit drugs. He drinks sodas 2-4 times per week, not daily and usually decaf coffee occasionally.   Recent A1c 10.1 on 02/11/17 and crea of 1.9, checked by PCP, Ms. Ahmed Prima.  He  takes Xanax 0.5 mg qHS.  He does endorse mild RLS symptoms, moves his feet and wiggles in bed per wife. She has noted breathing pauses while he is asleep.  Bedtime is around 11:30 PM. He has been taking Xanax every night for the past several years, originally was started by his cardiologist as I understand. Wakeup time varies. He has nocturia 3-4 times in an average night and has occasional morning headaches. His sister has obstructive sleep apnea as he recalls, may have a CPAP machine.  His Past Medical History Is Significant For: Past Medical History:  Diagnosis Date  . Coronary artery disease   . Diabetes mellitus without complication ([place])   . Hypertension   . Renal disorder   . Thyroid disease     His Past Surgical History Is Significant For: Past Surgical History:  Procedure Laterality Date  . EXPLORATION POST OPERATIVE OPEN HEART      His Family History Is Significant For: Family History  Problem Relation Age of Onset  . Atrial fibrillation Mother   . Heart disease Mother   . Stroke Mother   . Diabetes Father   . Stroke Brother   . Diabetes Brother   . Lung cancer Brother   . Heart attack Brother     His Social History Is Significant For: See above in HPI.   His Allergies Are:  Allergies  Allergen Reactions  . Diphenhydramine Hcl Other (See Comments)    "The more I take, it makes my prostate swell..."  . Ezetimibe Other (See Comments)    Unknown  . Lopressor [Metoprolol] Itching  . Metformin Other (See Comments)  Unknown  . Other Itching and Other (See Comments)    FA Dye Prostate swelling  . Sulfa Antibiotics Other (See Comments)    Unknown  :   His Current Medications Are:  Outpatient Encounter Prescriptions as of 02/19/2017  Medication Sig  . albuterol (PROVENTIL HFA;VENTOLIN HFA) 108 (90 Base) MCG/ACT inhaler Inhale 2 puffs into the lungs every 6 (six) hours as needed for wheezing or shortness of breath.   . ALPRAZolam (XANAX) 0.5 MG tablet Take  0.5 mg by mouth at bedtime as needed for sleep.   Marland Kitchen amLODipine (NORVASC) 5 MG tablet Take 5 mg by mouth daily.   Marland Kitchen aspirin 325 MG tablet Take 1 tablet (325 mg total) by mouth daily.  . budesonide (PULMICORT) 180 MCG/ACT inhaler Inhale 1 puff into the lungs 2 (two) times daily as needed (for shortness of breath).   . clopidogrel (PLAVIX) 75 MG tablet Take 1 tablet (75 mg total) by mouth daily.  . fluticasone (FLONASE) 50 MCG/ACT nasal spray Place 2 sprays into both nostrils daily as needed for allergies or rhinitis.  . hydrochlorothiazide (HYDRODIURIL) 25 MG tablet Take 25 mg by mouth daily.   . insulin glargine (LANTUS) 100 UNIT/ML injection Inject 66 Units into the skin daily.   . insulin regular (NOVOLIN R,HUMULIN R) 100 units/mL injection Inject 12 to 26 units subcutaneously three times a day before each meal per sliding scale  . levothyroxine (SYNTHROID) 50 MCG tablet Take 50 mcg by mouth daily before breakfast.   . lisinopril (PRINIVIL,ZESTRIL) 40 MG tablet Take 40 mg by mouth daily.   . metoprolol tartrate (LOPRESSOR) 25 MG tablet Take 25 mg by mouth 2 (two) times daily.   . nitroGLYCERIN (NITROSTAT) 0.4 MG SL tablet Place 0.4 mg under the tongue every 5 (five) minutes as needed for chest pain.   . Omega-3 Fatty Acids (FISH OIL) 1000 MG CAPS Take 1,000 mg by mouth daily.   . rosuvastatin (CRESTOR) 40 MG tablet Take 40 mg by mouth daily.   . tamsulosin (FLOMAX) 0.4 MG CAPS capsule Take 0.4 mg by mouth daily after supper.   . Vitamin D, Ergocalciferol, (DRISDOL) 50000 units CAPS capsule Take 1 tablet by mouth twice each week   No facility-administered encounter medications on file as of 02/19/2017.   :  Review of Systems:  Out of a complete 14 point review of systems, all are reviewed and negative with the exception of these symptoms as listed below: Review of Systems  Neurological:       Pt presents today to discuss his sleep. Pt has never had a sleep study but does endorse  snoring.  Epworth Sleepiness Scale 0= would never doze 1= slight chance of dozing 2= moderate chance of dozing 3= high chance of dozing  Sitting and reading: 3 Watching TV: 2 Sitting inactive in a public place (ex. Theater or meeting): 3 As a passenger in a car for an hour without a break: 2 Lying down to rest in the afternoon: 3 Sitting and talking to someone: 1 Sitting quietly after lunch (no alcohol): 3 In a car, while stopped in traffic: 0 Total: 17      Objective:  Neurologic Exam  Physical Exam Physical Examination:   Vitals:   02/19/17 0853  BP: (!) 155/73  Pulse: (!) 50  Resp: 20    General Examination: The patient is a very pleasant 66 y.o. male in no acute distress. He appears well-developed and well-nourished and well groomed.   HEENT: Normocephalic, atraumatic,  pupils are equal, round and reactive to light and accommodation. Extraocular tracking is good without limitation to gaze excursion or nystagmus noted. Normal smooth pursuit is noted. Hearing is grossly intact. Face is symmetric with normal facial animation and normal facial sensation. Speech is clear with no dysarthria noted. There is no hypophonia. There is no lip, neck/head, jaw or voice tremor. Neck is supple with full range of passive and active motion. There are no carotid bruits on auscultation. Oropharynx exam reveals: mild mouth dryness, adequate dental hygiene and moderate airway crowding, due to larger tongue, smaller airway entry. Mallampati is class II. Tongue protrudes centrally and palate elevates symmetrically. Tonsils are small or absent. Neck size is 19 1/8 inches. He has a Mild overbite. Nasal inspection reveals no significant nasal mucosal bogginess or redness and no septal deviation.   Chest: Clear to auscultation without wheezing, rhonchi or crackles noted.  Heart: S1+S2+0, regular and normal without murmurs, rubs or gallops noted.   Abdomen: Soft, non-tender and non-distended with  normal bowel sounds appreciated on auscultation.  Extremities: There is no pitting edema in the distal lower extremities bilaterally. Pedal pulses are intact.  Skin: Warm and dry without trophic changes noted.  Musculoskeletal: exam reveals no obvious joint deformities, tenderness or joint swelling or erythema, except mild decrease in ROM both shoulders.   Neurologically:  Mental status: The patient is awake, alert and oriented in all 4 spheres. His immediate and remote memory, attention, language skills and fund of knowledge are appropriate. There is no evidence of aphasia, agnosia, apraxia or anomia. Speech is clear with normal prosody and enunciation. Thought process is linear. Mood is normal and affect is normal.  Cranial nerves II - XII are as described above under HEENT exam. In addition: shoulder shrug is normal with equal shoulder height noted. Motor exam: Normal bulk, strength and tone is noted on the R and 4+/5 on the L. There is no drift, tremor or rebound. Romberg is negative with slight sway. Reflexes are 2+ throughout. Fine motor skills and coordination: intact with normal finger taps, normal hand movements, normal rapid alternating patting, normal foot taps and normal foot agility.  Cerebellar testing: No dysmetria or intention tremor on finger to nose testing. Heel to shin is unremarkable bilaterally. There is no truncal or gait ataxia.  Sensory exam: intact to light touch in the UEs and LEs.  Gait, station and balance: He stands easily. No veering to one side is noted. No leaning to one side is noted. Posture is age-appropriate and stance is narrow based. Gait shows normal stride length and normal pace. No problems turning are noted. Tandem walk is not possible.   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 I had a long chat with the patient and his wife about my findings and the diagnosis of OSA, its prognosis and treatment options. We talked about medical treatments, surgical interventions and non-pharmacological approaches. I explained in particular the risks and ramifications of untreated moderate to severe OSA, especially with respect to developing cardiovascular disease down the Road, including congestive heart failure, difficult to treat hypertension, cardiac arrhythmias, or stroke. Even type 2 diabetes has, in part, been linked to untreated OSA. Symptoms of untreated OSA include daytime  sleepiness, memory problems, mood irritability and mood disorder such as depression and anxiety, lack of energy, as well as recurrent headaches, especially morning headaches. We talked about trying to maintain a healthy lifestyle in general, as well as the importance of weight control. I encouraged the patient to eat healthy, exercise daily and keep well hydrated, to keep a scheduled bedtime and wake time routine, to not skip any meals and eat healthy snacks in between meals. I advised the patient not to drive when feeling sleepy. I recommended the following at this time: sleep study with potential positive airway pressure titration. (We will score hypopneas at 4%).   I explained the sleep test procedure to the patient and also outlined possible surgical and non-surgical treatment options of OSA, including the use of a custom-made dental device (which would require a referral to a specialist dentist or oral surgeon), upper airway surgical options, such as pillar implants, radiofrequency surgery, tongue base surgery, and UPPP (which would involve a referral to an ENT surgeon). Rarely, jaw surgery such as mandibular advancement may be considered.  I also explained the CPAP  treatment option to the patient, who indicated that he would be willing to try CPAP if the need arises. I explained the importance of being compliant with PAP treatment, not only for insurance purposes but primarily to improve His symptoms, and for the patient's long term health benefit, including to reduce His cardiovascular risks. I answered all their questions today and the patient and his wife was in agreement. I would like to see him back after the sleep study is completed and encouraged him to call with any interim questions, concerns, problems or updates. They are advised to keep his appointment and stroke follow-up clinic later this month as well.  Thank you very much for allowing me to participate in the care of this nice patient. If I can be of any further assistance to you please do not hesitate to talk to me.   Sincerely,   Star Age, MD, PhD

## 2017-03-06 ENCOUNTER — Ambulatory Visit (INDEPENDENT_AMBULATORY_CARE_PROVIDER_SITE_OTHER): Payer: Medicare Other | Admitting: Neurology

## 2017-03-06 DIAGNOSIS — G4733 Obstructive sleep apnea (adult) (pediatric): Secondary | ICD-10-CM | POA: Diagnosis not present

## 2017-03-06 DIAGNOSIS — G4761 Periodic limb movement disorder: Secondary | ICD-10-CM

## 2017-03-06 DIAGNOSIS — G4720 Circadian rhythm sleep disorder, unspecified type: Secondary | ICD-10-CM

## 2017-03-07 NOTE — Progress Notes (Signed)
Patient referred by Dr. Erlinda Hong, seen by me on 02/19/17, diagnostic PSG on 03/06/17.    Please call and notify the patient that the recent sleep study did confirm the diagnosis of mild to severe obstructive sleep apnea and in light of prior strokes and recent stroke Hx, and that I recommend treatment for this in the form of CPAP. This will require a repeat sleep study for proper titration and mask fitting. Please explain to patient and arrange for a CPAP titration study. I have placed an order in the chart. Thanks, and please route to [facility] for scheduling next sleep study.  Star Age, MD, PhD [facility])

## 2017-03-07 NOTE — Procedures (Signed)
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 The patient endorsed the Epworth Sleepiness Scale at 17 points.   The patient's weight 243 pounds with a height of 72 (inches), resulting in a BMI of 32.8 kg/m2. The patient's neck circumference measured 19.2 inches.  CURRENT MEDICATIONS: Ventolin HFA, Xanax, Norvasc, Aspirin, Pulmicort, Plavix, Flonase, HCTZ, Lantus, Novolin, Synthroid, Zestril, Lopressor, Nitrostat, fish oil, Crestor, Flomax, Vitamin D   PROCEDURE:  This is a multichannel digital polysomnogram utilizing the Somnostar 11.2 system.  Electrodes and sensors were applied and monitored per AASM Specifications.   EEG, EOG, Chin and Limb EMG, were sampled at 200 Hz.  ECG, Snore and Nasal Pressure, Thermal Airflow, Respiratory Effort, CPAP Flow and Pressure, Oximetry was sampled at 50 Hz. Digital video and audio were recorded.      BASELINE STUDY  Lights Out was at 21:03 and Lights On at 05:02.  Total recording time (TRT) was 478.5 minutes, with a total sleep time (TST) of  249.5 minutes.   The patient's sleep latency was 5 minutes.  REM latency was 434 minutes, which is markedly delaye.  The sleep efficiency was 52.1 %, which is significantly reduced.     SLEEP ARCHITECTURE: WASO (Wake after sleep onset) was 203 minutes with moderate sleep fragmentation noted and a long period of wakeful of over 2 hours.  There were 5 minutes in Stage N1, 187.5 minutes Stage N2, 39.5 minutes Stage N3 and 17.5 minutes in Stage REM.  The percentage of Stage N1 was 2.%, Stage N2 was 75.2%, which is  increased, Stage N3 was 15.8%, which is normal and Stage R (REM sleep) was 7.%, which is reduced.   The arousals were noted as: 39 were spontaneous, 17 were associated with PLMs, 30 were associated with respiratory events.   Audio and video analysis did not show any abnormal or unusual movements, behaviors, phonations or vocalizations. The patient took 2 bathroom breaks. Mild to moderate snoring was noted.  The EKG was in keeping with normal sinus rhythm (NSR).   RESPIRATORY ANALYSIS:  There were a total of 50 respiratory events:  8 obstructive apneas, 5 central apneas and 6 mixed apneas with a total of 19 apneas and an apnea index (AI) of 4.6 /hour. There were 31 hypopneas with a hypopnea index of 7.5 /hour. The patient also had 0 respiratory event related arousals (RERAs).      The total APNEA/HYPOPNEA INDEX (AHI) was 12./hour and the total RESPIRATORY DISTURBANCE INDEX was 12. /hour.  9 events occurred in REM sleep and 61 events in NREM. The REM AHI was 30.9 /hour, versus a non-REM AHI of 10.6. The patient spent 171.5 minutes of total sleep time in the supine position and 78 minutes in non-supine.. The supine AHI was 12.9 versus a non-supine AHI of 10.0.  OXYGEN SATURATION & C02:  The Wake baseline 02 saturation was 97%, with the lowest being 75%. Time spent below 89% saturation equaled 51 minutes.  PERIODIC LIMB MOVEMENTS: The patient had a total of 451 Periodic Limb Movements.  The Periodic Limb Movement (PLM) index was 108.5  and the PLM Arousal index was 4.1/hour.   Post-study, the patient indicated that sleep was the same as usual.   IMPRESSION: 1. Obstructive Sleep Apnea (OSA) 2. Periodic Limb Movement Disorder (PLMD) 3. Dysfunctions associated with sleep stages or arousal from sleep  RECOMMENDATIONS: 1. This study demonstrates overall mild obstructive sleep apnea, but severe OSA in REM sleep with a total AHI of 12/hour, REM AHI of 30.9/hour, and O2 nadir of 75%. Given the patient's  medical history and sleep related complaints, a full-night CPAP titration study is recommended to optimize therapy. Other treatment options for OSA (generally speaking) may include avoidance of supine sleep position along with weight loss, upper airway or jaw surgery in selected patients or the use of an oral appliance in certain patients. ENT evaluation and/or consultation with a maxillofacial surgeon or dentist may be feasible in some instances.    2. Severe PLMs (periodic limb movements of sleep) were noted during this baseline study without significant arousals; clinical correlation is recommended. PLMs may improve with CPAP treatment for OSA.  3. This study shows sleep fragmentation and abnormal sleep stage percentages; these are nonspecific findings and per se do not signify an intrinsic sleep disorder or a cause for the patient's sleep-related symptoms. Causes include (but are not limited to) the first night effect of the sleep study, circadian rhythm disturbances, medication effect or an underlying mood disorder or medical problem.  4. The patient should be cautioned not to drive, work at heights, or operate dangerous or heavy equipment when tired or sleepy. Review and reiteration of good sleep hygiene measures should be pursued with any patient. 5. The patient will be seen in follow-up by Dr. Rexene Alberts at [facility] for discussion of the test results and further management strategies. The referring provider will be notified of the test results.  I certify that I have reviewed the entire raw data recording prior to the issuance of this report in accordance with the Standards of Accreditation of the American Academy of Sleep Medicine (AASM)    Star Age, MD, PhD Diplomat, American Board of Psychiatry and Neurology (Neurology and Sleep Medicine)

## 2017-03-07 NOTE — Addendum Note (Signed)
Addended by: Star Age on: 03/07/2017 02:11 PM   Modules accepted: Orders

## 2017-03-10 ENCOUNTER — Encounter (INDEPENDENT_AMBULATORY_CARE_PROVIDER_SITE_OTHER): Payer: Self-pay

## 2017-03-10 ENCOUNTER — Ambulatory Visit (INDEPENDENT_AMBULATORY_CARE_PROVIDER_SITE_OTHER): Payer: Medicare Other | Admitting: Nurse Practitioner

## 2017-03-10 ENCOUNTER — Encounter: Payer: Self-pay | Admitting: Nurse Practitioner

## 2017-03-10 ENCOUNTER — Telehealth: Payer: Self-pay

## 2017-03-10 VITALS — BP 120/70 | HR 59 | Ht 71.0 in | Wt 246.0 lb

## 2017-03-10 DIAGNOSIS — I639 Cerebral infarction, unspecified: Secondary | ICD-10-CM | POA: Diagnosis not present

## 2017-03-10 DIAGNOSIS — I10 Essential (primary) hypertension: Secondary | ICD-10-CM | POA: Diagnosis not present

## 2017-03-10 DIAGNOSIS — G4733 Obstructive sleep apnea (adult) (pediatric): Secondary | ICD-10-CM | POA: Insufficient documentation

## 2017-03-10 DIAGNOSIS — E785 Hyperlipidemia, unspecified: Secondary | ICD-10-CM

## 2017-03-10 NOTE — Patient Instructions (Addendum)
Stressed the importance of management of risk factors to prevent further stroke Continue Plavix and aspirin for secondary stroke prevention for 3 months then aspirin alone 325mg  on 04/26/17.  Maintain strict control of hypertension with blood pressure goal below 130/90, today's reading 120/70 continue antihypertensive medications Control of diabetes with hemoglobin A1c below 6.5 followed by primary care most recent hemoglobin A1c10.7  continue diabetic medications Cholesterol with LDL cholesterol less than 70, followed by primary care,  most recent 91 continue Crestor Continue PT, Exercise by walking, slowly increase , eat healthy diet with whole grains,  fresh fruits and vegetables Follow up for CPAP titration Follow up 6 months

## 2017-03-10 NOTE — Progress Notes (Addendum)
[facility]  PATIENT: Marguis Mathieson DOB: Jun 14, 1951   REASON FOR VISIT: Hospital follow-up for stroke HISTORY FROM: Patient    HISTORY OF PRESENT ILLNESS: [name], 66 year old male returns for hospital follow-up after admission for trouble walking for a couple of days. He also had some slurring of speech. He has past medical history of diabetes ,coronary artery disease.  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 Marland Kitchen 2-D echo EF 60-65%. LDL 91 hemoglobin A1c 10.7. He was placed on aspirin and Plavix which he is to continue for 3 months and then either aspirin or Plavix alone due to intracranial stenosis. Crestor was increased to 40 mg daily. He is on sliding scale for his diabetes. His sleep study was positive for obstructive sleep apnea. He needs to be titrated her CPAP. He returns for reevaluation   REVIEW OF SYSTEMS: Full 14 system review of systems performed and notable only for those listed, all others are neg:  Constitutional: neg  Cardiovascular: neg Ear/Nose/Throat: neg  Skin: neg Eyes: neg Respiratory: Cough Gastroitestinal: neg  Hematology/Lymphatic: neg  Endocrine: neg Musculoskeletal:neg Allergy/Immunology: neg Neurological: Occasional headache Psychiatric: Anxiety Sleep : neg   ALLERGIES: Allergies  Allergen Reactions  . Diphenhydramine Hcl Other (See Comments)    "The more I take, it makes my prostate swell..."  . Ezetimibe Other (See Comments)    Unknown  . Lopressor [Metoprolol] Itching  . Metformin Other (See Comments)    Unknown  . Other Itching and Other (See Comments)    FA Dye Prostate swelling  . Sulfa Antibiotics Other (See Comments)    Unknown    HOME MEDICATIONS: Outpatient Medications Prior to Visit  Medication Sig Dispense Refill  . albuterol (PROVENTIL HFA;VENTOLIN HFA) 108 (90  Base) MCG/ACT inhaler Inhale 2 puffs into the lungs every 6 (six) hours as needed for wheezing or shortness of breath.     . ALPRAZolam (XANAX) 0.5 MG tablet Take 0.5 mg by mouth at bedtime as needed for sleep.     Marland Kitchen amLODipine (NORVASC) 5 MG tablet Take 5 mg by mouth daily.     Marland Kitchen aspirin 325 MG tablet Take 1 tablet (325 mg total) by mouth daily. 90 tablet 0  . budesonide (PULMICORT) 180 MCG/ACT inhaler Inhale 1 puff into the lungs 2 (two) times daily as needed (for shortness of breath).     . clopidogrel (PLAVIX) 75 MG tablet Take 1 tablet (75 mg total) by mouth daily. 90 tablet 0  . fluticasone (FLONASE) 50 MCG/ACT nasal spray Place 2 sprays into both nostrils daily as needed for allergies or rhinitis.    . hydrochlorothiazide (HYDRODIURIL) 25 MG tablet Take 25 mg by mouth daily.     . insulin glargine (LANTUS) 100 UNIT/ML injection Inject 66 Units into the skin daily.     . insulin regular (NOVOLIN R,HUMULIN R) 100 units/mL injection Inject 12 to 26 units subcutaneously three times a day before each meal per sliding scale    . levothyroxine (SYNTHROID) 50 MCG tablet Take 50 mcg by mouth daily before breakfast.     . lisinopril (PRINIVIL,ZESTRIL) 40 MG tablet Take 40 mg by mouth daily.     . metoprolol tartrate (LOPRESSOR) 25 MG tablet Take 25 mg by mouth 2 (two) times daily.     . nitroGLYCERIN (NITROSTAT) 0.4 MG SL tablet Place 0.4 mg under the  tongue every 5 (five) minutes as needed for chest pain.     . Omega-3 Fatty Acids (FISH OIL) 1000 MG CAPS Take 1,000 mg by mouth daily.     . rosuvastatin (CRESTOR) 40 MG tablet Take 40 mg by mouth daily.     . tamsulosin (FLOMAX) 0.4 MG CAPS capsule Take 0.4 mg by mouth daily after supper.     . Vitamin D, Ergocalciferol, (DRISDOL) 50000 units CAPS capsule Take 1 tablet by mouth twice each week     No facility-administered medications prior to visit.     PAST MEDICAL HISTORY: Past Medical History:  Diagnosis Date  . Coronary artery disease   .  Diabetes mellitus without complication ([place])   . Hypertension   . Renal disorder   . Thyroid disease     PAST SURGICAL HISTORY: Past Surgical History:  Procedure Laterality Date  . EXPLORATION POST OPERATIVE OPEN HEART      FAMILY HISTORY: Family History  Problem Relation Age of Onset  . Atrial fibrillation Mother   . Heart disease Mother   . Stroke Mother   . Diabetes Father   . Stroke Brother   . Diabetes Brother   . Lung cancer Brother   . Heart attack Brother     SOCIAL HISTORY: See above in HPI.   PHYSICAL EXAM  Vitals:   03/10/17 0835  BP: 120/70  Pulse: (!) 59  Weight: 246 lb (111.6 kg)  Height: 5\' 11"  (1.803 m)   Body mass index is 34.31 kg/m.  Generalized: Well developed, in no acute distress  Head: normocephalic and atraumatic,. Oropharynx benign  Neck: Supple, no carotid bruits  Cardiac: Regular rate rhythm, no murmur  Musculoskeletal: No deformity   Neurological examination   Mentation: Alert oriented to time, place, history taking. Attention span and concentration appropriate. Recent and remote memory intact.  Follows all commands speech and language fluent.   Cranial nerve II-XII: Pupils were equal round reactive to light extraocular movements were full, visual field were full on confrontational test. Facial sensation and strength were normal. hearing was intact to finger rubbing bilaterally. Uvula tongue midline. head turning and shoulder shrug were normal and symmetric.Tongue protrusion into cheek strength was normal. Motor: normal bulk and tone, full strength in the BUE, BLE, Except mild left hand grip weakness Sensory: normal and symmetric to light touch, pinprick, and  Vibration, in the upper and lower extremities  Coordination: finger-nose-finger, heel-to-shin bilaterally, no dysmetria Reflexes: 1+ upper lower and symmetric plantar responses were flexor bilaterally. Gait and Station: Rising up from seated position without assistance, normal  stance,  moderate stride, good arm swing, smooth turning, able to perform tiptoe, and heel walking without difficulty. Tandem gait is steady. No assistive device  DIAGNOSTIC DATA (LABS, IMAGING, TESTING) - I reviewed patient records, labs, notes, testing and imaging myself where available.  Lab Results  Component Value Date   WBC 11.9 (H) 01/23/2017   HGB 12.7 (L) 01/23/2017   HCT 38.6 (L) 01/23/2017   MCV 88.1 01/23/2017   PLT 263 01/23/2017      Component Value Date/Time   NA 135 01/23/2017 2125   K 4.2 01/23/2017 2125   CL 102 01/23/2017 2125   CO2 22 01/23/2017 2125   GLUCOSE 260 (H) 01/23/2017 2125   BUN 25 (H) 01/23/2017 2125   CREATININE 1.58 (H) 01/23/2017 2125   CALCIUM 9.3 01/23/2017 2125   GFRNONAA 44 (L) 01/23/2017 2125   GFRAA 51 (L) 01/23/2017 2125   Lab Results  Component Value Date   CHOL 147 01/24/2017   HDL 41 01/24/2017   LDLCALC 91 01/24/2017   TRIG 74 01/24/2017   CHOLHDL 3.6 01/24/2017   Lab Results  Component Value Date   HGBA1C 10.7 (H) 01/24/2017    ASSESSMENT AND PLAN  66 y.o. year old male  has a past medical history of Coronary artery disease; Diabetes mellitus without complication ([place]); Hypertension; Renal disorder; and Thyroid disease. And stroke here  For hospital follow-up   PLAN: Stressed the importance of management of risk factors to prevent further stroke Continue Plavix and aspirin for secondary stroke prevention for 3 months then aspirin alone 325mg  on 04/26/17.  Maintain strict control of hypertension with blood pressure goal below 130/90, today's reading 120/70 continue antihypertensive medications Control of diabetes with hemoglobin A1c below 6.5 followed by primary care most recent hemoglobin A1c10.7  continue diabetic medications and close follow up with PCP Cholesterol with LDL cholesterol less than 70, followed by primary care,  most recent 91 continue Crestor Continue PT, Exercise by walking, slowly increase , eat healthy  diet with whole grains,  fresh fruits and vegetables Follow up 6 months Discussed risk for recurrent stroke/ TIA and answered additional questions This was a visit requiring 30 minutes and medical decision making of high complexity with extensive review of history, hospital chart, counseling and answering questions Dennie Bible, [place], [facility], APRN  [facility] [address], [place], [place] [address] [phone]

## 2017-03-10 NOTE — Telephone Encounter (Signed)
-----   Message from Star Age, MD sent at 03/07/2017  2:11 PM EDT ----- Patient referred by Dr. Erlinda Hong, seen by me on 02/19/17, diagnostic PSG on 03/06/17.    Please call and notify the patient that the recent sleep study did confirm the diagnosis of mild to severe obstructive sleep apnea and in light of prior strokes and recent stroke Hx, and that I recommend treatment for this in the form of CPAP. This will require a repeat sleep study for proper titration and mask fitting. Please explain to patient and arrange for a CPAP titration study. I have placed an order in the chart. Thanks, and please route to [facility] for scheduling next sleep study.  Star Age, MD, PhD [facility])

## 2017-03-10 NOTE — Telephone Encounter (Signed)
Pt presented today for his appt with Hoyle Sauer, NP and at this appt I was able to discuss with he and his wife the results of this study,. I advised pt that Dr. Rexene Alberts reviewed their sleep study results and found that pt does have mild to severe osa. Dr. Rexene Alberts recommends that pt return for a repeat sleep study in order to properly titrate the cpap and ensure a good mask fit. Pt is agreeable to returning for a titration study. I advised pt that our sleep lab will file with pt's insurance and call pt to schedule the sleep study when we hear back from the pt's insurance regarding coverage of this sleep study. Pt verbalized understanding of results. Pt had no questions at this time but was encouraged to call back if questions arise.

## 2017-03-12 NOTE — Progress Notes (Signed)
I reviewed above note and agree with the assessment and plan. Pt can be discharged if stable at next visit. Thanks.   Rosalin Hawking, MD PhD Stroke Neurology 03/12/2017 8:17 AM

## 2017-03-18 ENCOUNTER — Ambulatory Visit (INDEPENDENT_AMBULATORY_CARE_PROVIDER_SITE_OTHER): Payer: Medicare Other | Admitting: Neurology

## 2017-03-18 DIAGNOSIS — G4733 Obstructive sleep apnea (adult) (pediatric): Secondary | ICD-10-CM

## 2017-03-18 DIAGNOSIS — G4720 Circadian rhythm sleep disorder, unspecified type: Secondary | ICD-10-CM

## 2017-03-18 DIAGNOSIS — R9431 Abnormal electrocardiogram [ECG] [EKG]: Secondary | ICD-10-CM

## 2017-03-20 ENCOUNTER — Telehealth: Payer: Self-pay

## 2017-03-20 NOTE — Procedures (Signed)
PATIENT'S NAME:  Roberto Ingram, [place] DOB:      12-12-1950      MR#:    [id number]     DATE OF RECORDING: 03/18/2017 REFERRING M.D.: Dr. 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 His PSG from 03/06/2017 showed an AHI of 12 and low SPO2 75%. The patient endorsed the Epworth Sleepiness Scale at 17 points. The patient's weight 243 pounds with a height of 72 (inches), resulting in a BMI of 32.8 kg/m2. The patient's neck circumference measured 19 inches.  CURRENT MEDICATIONS: Ventolin HFA, Xanax, Norvasc, Aspirin, Pulmicort, Plavix, Flonase, HCTZ, Lantus, Novolin, Synthroid, Zestril, Lopressor, Nitrostat, fish oil, Crestor, Flomax, Vitamin D   PROCEDURE:  This is a multichannel digital polysomnogram utilizing the SomnoStar 11.2 system.  Electrodes and sensors were applied and monitored per AASM Specifications.   EEG, EOG, Chin and Limb EMG, were sampled at 200 Hz.  ECG, Snore and Nasal Pressure, Thermal Airflow, Respiratory Effort, CPAP Flow and Pressure, Oximetry was sampled at 50 Hz. Digital video and audio were recorded.      The patient was fitted with large nasal pillows. CPAP was initiated at 5 cmH20 with heated humidity per AASM standards and pressure was advanced to 6 cmH20 because of hypopneas, apneas and desaturations.  At a PAP pressure of 6 cmH20, there was a reduction of the AHI to 0 with non supine REM sleep achieved and O2 nadir of 90%.     Lights Out was at 21:25 and Lights On at 05:00. Total recording time (TRT) was 455.5 minutes, with a total sleep time (TST) of 357.5 minutes. The patient's sleep latency was 24 minutes. REM latency was 35.5 minutes, which is reduced.  The sleep efficiency was 78.5 %.    SLEEP  ARCHITECTURE: WASO (Wake after sleep onset)  was 73.5 minutes with moderate sleep fragmentation noted.  There were 56 minutes in Stage N1, 192.5 minutes Stage N2, 59 minutes Stage N3 and 50 minutes in Stage REM.  The percentage of Stage N1 was 15.7%, which is increased, Stage N2 was 53.8%, which is normal, Stage N3 was 16.5% and Stage R (REM sleep) was 14.%, which is decreased. The arousals were noted as: 83 were spontaneous, 5 were associated with PLMs, 0 were associated with respiratory events.  Audio and video analysis did not show any abnormal or unusual movements, behaviors, phonations or vocalizations.  The patient took 1 bathroom break.  EKG showed occasional PACs.   RESPIRATORY ANALYSIS:  There was a total of 0 respiratory events: 0 obstructive apneas, 0 central apneas and 0 mixed apneas with a total of 0 apneas and an apnea index (AI) of 0 /hour. There were 0 hypopneas with a hypopnea index of 0/hour. The patient also had 0 respiratory event related arousals (RERAs).      The total APNEA/HYPOPNEA INDEX  (AHI) was 0 /hour and the total RESPIRATORY DISTURBANCE INDEX was 0 .hour  0 events occurred in REM sleep and 0 events in NREM. The REM AHI was 0 /hour versus a non-REM AHI of 0 /hour.  The patient spent 299.5 minutes of total sleep time in the supine position and 58 minutes in non-supine. The supine AHI was 0.0, versus a non-supine AHI of 0.0.  OXYGEN  SATURATION & C02:  The baseline 02 saturation was 97%, with the lowest being 85%. Time spent below 89% saturation equaled 8 minutes.  PERIODIC LIMB MOVEMENTS: The patient had a total of 7 Periodic Limb Movements. The Periodic Limb Movement (PLM) index was 1.2 and the PLM Arousal index was .8 /hour.  Post-study, the patient indicated that sleep was the same as usual.   DIAGNOSIS 1. Obstructive Sleep Apnea  2. Nonspecific abnormal EKG 3. Dysfunctions associated with sleep stages or arousal from sleep    PLANS/RECOMMENDATIONS: 1. This study  demonstrates resolution of the patient's obstructive sleep apnea with CPAP therapy. I will, therefore, start the patient on home CPAP treatment at a pressure of 6 cm via large nasal pillows with heated humidity. The patient should be reminded to be fully compliant with PAP therapy to improve sleep related symptoms and decrease long term cardiovascular risks. The patient should be reminded, that it may take up to 3 months to get fully used to using PAP with all planned sleep. The earlier full compliance is achieved, the better long term compliance tends to be. Please note that untreated obstructive sleep apnea carries additional perioperative morbidity. Patients with significant obstructive sleep apnea should receive perioperative PAP therapy and the surgeons and particularly the anesthesiologist should be informed of the diagnosis and the severity of the sleep disordered breathing. 2. The study showed occasional PACs on single lead EKG; clinical correlation is recommended and consultation with cardiology may be feasible.  3. This study shows sleep fragmentation and abnormal sleep stage percentages; these are nonspecific findings and per se do not signify an intrinsic sleep disorder or a cause for the patient's sleep-related symptoms. Causes include (but are not limited to) the first night effect of the sleep study, circadian rhythm disturbances, medication effect or an underlying mood disorder or medical problem.  4. The patient should be cautioned not to drive, work at heights, or operate dangerous or heavy equipment when tired or sleepy. Review and reiteration of good sleep hygiene measures should be pursued with any patient.  I certify that I have reviewed the entire raw data recording prior to the issuance of this report in accordance with the Standards of Accreditation of the American Academy of Sleep Medicine (AASM)   Star Age, MD, PhD Diplomat, American Board of Psychiatry and Neurology (Neurology  and Sleep Medicine)

## 2017-03-20 NOTE — Telephone Encounter (Signed)
-----   Message from Roberto Age, MD sent at 03/20/2017  8:15 AM EDT ----- Patient referred by Dr. Erlinda Hong, seen by me on 02/19/17, diagnostic PSG on 03/06/17, CPAP study on 03/18/17:  Please call and inform patient that I have entered an order for treatment with positive airway pressure (PAP) treatment of obstructive sleep apnea (OSA). He did well during the latest sleep study with CPAP. We will, therefore, arrange for a machine for home use through a DME (durable medical equipment) company of His choice; and I will see the patient back in follow-up in about 10 weeks. Please also explain to the patient that I will be looking out for compliance data, which can be downloaded from the machine (stored on an SD card, that is inserted in the machine) or via remote access through a modem, that is built into the machine. At the time of the followup appointment we will discuss sleep study results and how it is going with PAP treatment at home. Please advise patient to bring His machine at the time of the first FU visit, even though this is cumbersome. Bringing the machine for every visit after that will likely not be needed, but often helps for the first visit to troubleshoot if needed. Please re-enforce the importance of compliance with treatment and the need for [place] to monitor compliance data - often an insurance requirement and actually good feedback for the patient as far as how they are doing.  Also remind patient, that any interim PAP machine or mask issues should be first addressed with the DME company, as they can often help better with technical and mask fit issues. Please ask if patient has a preference regarding DME company.  Please also make sure, the patient has a follow-up appointment with me in about 10 weeks from the setup date, thanks.  Once you have spoken to the patient - and faxed/routed report to PCP and referring MD (if other than PCP), you can close this encounter, thanks,   Roberto Age, MD, PhD [facility] ([place])

## 2017-03-20 NOTE — Addendum Note (Signed)
Addended by: Star Age on: 03/20/2017 08:15 AM   Modules accepted: Orders

## 2017-03-20 NOTE — Telephone Encounter (Signed)
I spoke to patient and he is aware of results and recommendations. He is willing to start treatment. I will send orders to [facility] per patient request. I will send report to PCP. Patient will receive a letter reminding her to make f/u appt and stress the importance of compliance.

## 2017-03-20 NOTE — Progress Notes (Signed)
Patient referred by Dr. Erlinda Hong, seen by me on 02/19/17, diagnostic PSG on 03/06/17, CPAP study on 03/18/17:  Please call and inform patient that I have entered an order for treatment with positive airway pressure (PAP) treatment of obstructive sleep apnea (OSA). He did well during the latest sleep study with CPAP. We will, therefore, arrange for a machine for home use through a DME (durable medical equipment) company of His choice; and I will see the patient back in follow-up in about 10 weeks. Please also explain to the patient that I will be looking out for compliance data, which can be downloaded from the machine (stored on an SD card, that is inserted in the machine) or via remote access through a modem, that is built into the machine. At the time of the followup appointment we will discuss sleep study results and how it is going with PAP treatment at home. Please advise patient to bring His machine at the time of the first FU visit, even though this is cumbersome. Bringing the machine for every visit after that will likely not be needed, but often helps for the first visit to troubleshoot if needed. Please re-enforce the importance of compliance with treatment and the need for [place] to monitor compliance data - often an insurance requirement and actually good feedback for the patient as far as how they are doing.  Also remind patient, that any interim PAP machine or mask issues should be first addressed with the DME company, as they can often help better with technical and mask fit issues. Please ask if patient has a preference regarding DME company.  Please also make sure, the patient has a follow-up appointment with me in about 10 weeks from the setup date, thanks.  Once you have spoken to the patient - and faxed/routed report to PCP and referring MD (if other than PCP), you can close this encounter, thanks,   Star Age, MD, PhD [facility] ([place])

## 2017-04-09 NOTE — Progress Notes (Signed)
PT Eval Addendum for G-Codes    02/10/2017 1115  PT G-Codes **NOT FOR INPATIENT CLASS**  Functional Assessment Tool Used Clinical judgement  Functional Limitation Mobility: Walking and moving around  Mobility: Walking and Moving Around Current Status (Y5859) CJ  Mobility: Walking and Moving Around Goal Status (Y9244) CI   Rolinda Roan, PT, DPT Acute Rehabilitation Services Pager: [phone]

## 2017-04-28 ENCOUNTER — Telehealth: Payer: Self-pay | Admitting: Nurse Practitioner

## 2017-04-28 NOTE — Telephone Encounter (Signed)
Pt wife calling after speaking with husbands case worker re: clopidogrel (PLAVIX) 75 MG tablet     aspirin 325 MG tablet, she said pt is out and is need of refills if it is still recommended that he takes both.  Please call pt wife

## 2017-04-28 NOTE — Telephone Encounter (Signed)
Spoke to wife and relayed as from last note 03-10-17 when pt saw CM/NP, he was on dual therapy of plavix and aspirin for 3 months from stroke, then to transition to aspirin 325mg  po daily starting 04-26-17.  I relayed that aspirin is OTC and should be able to get this a drug store.  She will call back as needed.  She verbalized understanding.

## 2017-05-06 ENCOUNTER — Encounter: Payer: Self-pay | Admitting: Dietician

## 2017-05-27 ENCOUNTER — Telehealth: Payer: Self-pay | Admitting: Neurology

## 2017-05-27 DIAGNOSIS — Z9989 Dependence on other enabling machines and devices: Principal | ICD-10-CM

## 2017-05-27 DIAGNOSIS — G4733 Obstructive sleep apnea (adult) (pediatric): Secondary | ICD-10-CM

## 2017-05-27 NOTE — Telephone Encounter (Signed)
Received a verbal order from Dr. Rexene Alberts for a  FFM or mask of pt's choice. Order faxed to [facility]. Received a receipt of confirmation.

## 2017-05-27 NOTE — Telephone Encounter (Signed)
Leda Gauze from [facility] is calling and says that pt was set up on cpap on 05/20/2017 but the nasal mask is not comfortable for him and he is asking for a FFM. However, Dr. Guadelupe Sabin orders do not specify getting a new mask if pt requires it and so they need an order to be able to switch pt's mask. I advised Leda Gauze that I will speak with Dr. Rexene Alberts. She asked me to fax the order to [phone].

## 2017-05-27 NOTE — Telephone Encounter (Signed)
Roberto Ingram with [facility] is calling to discuss order for CPAP for the patient.

## 2017-05-27 NOTE — Addendum Note (Signed)
Addended by: Lester [place] A on: 05/27/2017 03:39 PM   Modules accepted: Orders

## 2017-07-28 ENCOUNTER — Ambulatory Visit: Payer: Medicare Other | Admitting: Neurology

## 2017-08-07 ENCOUNTER — Ambulatory Visit: Payer: Medicare Other | Admitting: Nurse Practitioner

## 2017-08-08 NOTE — Progress Notes (Addendum)
[facility]  PATIENT: Roberto Ingram DOB: December 28, 1950   REASON FOR VISIT:  follow-up for stroke, first CPAP compliance HISTORY FROM: Patient    HISTORY OF PRESENT ILLNESS: UPDATE 10/1/18CM Roberto Ingram, 66 year old male returns for follow-up with history of stroke event in March 2018. He is currently on aspirin 325 daily without recurrent stroke or TIA symptoms. He has minimal bruising and bleeding. Blood pressure in the office today 130/73. He remains on Crestor without myalgias. He exercises 10-15 minutes by walking daily. He is diabetic and remains on insulin with close follow-up with endocrinology in [place]. He has been using CPAP for about 2 months. He is still getting used to it .Compliance data dated 07/11/2017 through 08/09/2017 shows usage greater than 4 hours at 63% for 19 days. Average usage 6 hours 32 minutes. Set pressure 6 cm. EPR 1. AHI 4. No significant leaks. He did have pneumonia in September  and could not use his CPAP during that time. He returns for reevaluation   HISTORY 03/10/17 CM Roberto Ingram, 66 year old male returns for hospital follow-up after admission for trouble walking for a couple of days. He also had some slurring of speech. He has past medical history of diabetes ,coronary artery disease.  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 Roberto Ingram 2-D echo EF 60-65%. LDL 91 hemoglobin A1c 10.7. He was placed on aspirin and Plavix which he is to continue for 3 months and then either aspirin or Plavix alone due to intracranial stenosis. Crestor was increased to 40 mg daily. He is on sliding scale for his diabetes. His sleep study was positive for obstructive sleep apnea. He needs to be titrated her CPAP. He returns for reevaluation   REVIEW OF SYSTEMS: Full 14 system review of systems performed and notable only for those  listed, all others are neg:  Constitutional: neg  Cardiovascular: neg Ear/Nose/Throat: neg  Skin: neg Eyes: neg Respiratory: Cough Gastroitestinal: neg  Hematology/Lymphatic: Anemia Endocrine: neg Musculoskeletal:neg Allergy/Immunology: neg Neurological: Occasional headache,  Psychiatric: Anxiety  Sleep : Obstructive sleep apnea with CPAP   ALLERGIES: Allergies  Allergen Reactions  . Diphenhydramine Hcl Other (See Comments)    "The more I take, it makes my prostate swell..."  . Ezetimibe Other (See Comments)    Unknown  . Lopressor [Metoprolol] Itching  . Metformin Other (See Comments)    Unknown  . Other Itching and Other (See Comments)    FA Dye Prostate swelling  . Sulfa Antibiotics Other (See Comments)    Unknown    HOME MEDICATIONS: Outpatient Medications Prior to Visit  Medication Sig Dispense Refill  . albuterol (PROVENTIL HFA;VENTOLIN HFA) 108 (90 Base) MCG/ACT inhaler Inhale 2 puffs into the lungs every 6 (six) hours as needed for wheezing or shortness of breath.     . ALPRAZolam (XANAX) 0.5 MG tablet Take 0.5 mg by mouth at bedtime as needed for sleep.     Roberto Ingram aspirin 325 MG tablet Take 1 tablet (325 mg total) by mouth daily. 90 tablet 0  . budesonide (PULMICORT) 180 MCG/ACT inhaler Inhale 1 puff into the lungs 2 (two) times daily as needed (for shortness of breath).     . fluticasone (FLONASE) 50 MCG/ACT nasal spray Place 2 sprays into both nostrils daily as needed for allergies or rhinitis.    Roberto Ingram insulin glargine (LANTUS) 100 UNIT/ML injection Inject 66 Units  into the skin daily.     . insulin regular (NOVOLIN R,HUMULIN R) 100 units/mL injection Inject 12 to 26 units subcutaneously three times a day before each meal per sliding scale    . levothyroxine (SYNTHROID) 50 MCG tablet Take 50 mcg by mouth daily before breakfast.     . lisinopril (PRINIVIL,ZESTRIL) 40 MG tablet Take 40 mg by mouth daily.     . metoprolol tartrate (LOPRESSOR) 25 MG tablet Take 25 mg by  mouth 2 (two) times daily.     . nitroGLYCERIN (NITROSTAT) 0.4 MG SL tablet Place 0.4 mg under the tongue every 5 (five) minutes as needed for chest pain.     . Omega-3 Fatty Acids (FISH OIL) 1000 MG CAPS Take 1,000 mg by mouth daily.     . rosuvastatin (CRESTOR) 40 MG tablet Take 40 mg by mouth daily.     . tamsulosin (FLOMAX) 0.4 MG CAPS capsule Take 0.4 mg by mouth daily after supper.     . Vitamin D, Ergocalciferol, (DRISDOL) 50000 units CAPS capsule Take 1 tablet by mouth twice each week    . amLODipine (NORVASC) 5 MG tablet Take 5 mg by mouth daily.     . clopidogrel (PLAVIX) 75 MG tablet Take 1 tablet (75 mg total) by mouth daily. (Patient not taking: Reported on 08/11/2017) 90 tablet 0  . hydrochlorothiazide (HYDRODIURIL) 25 MG tablet Take 25 mg by mouth daily.      No facility-administered medications prior to visit.     PAST MEDICAL HISTORY: Past Medical History:  Diagnosis Date  . Coronary artery disease   . Diabetes mellitus without complication ([place])   . Hypertension   . Renal disorder   . Thyroid disease     PAST SURGICAL HISTORY: Past Surgical History:  Procedure Laterality Date  . EXPLORATION POST OPERATIVE OPEN HEART      FAMILY HISTORY: Family History  Problem Relation Age of Onset  . Atrial fibrillation Mother   . Heart disease Mother   . Stroke Mother   . Diabetes Father   . Stroke Brother   . Diabetes Brother   . Lung cancer Brother   . Heart attack Brother     SOCIAL HISTORY: See above in HPI.   PHYSICAL EXAM  Vitals:   08/11/17 1024  BP: 130/73  Pulse: (!) 53  Weight: 247 lb (112 kg)  Height: 5\' 11"  (1.803 m)   Body mass index is 34.45 kg/m.  Generalized: Well developed, in no acute distress  Head: normocephalic and atraumatic,. Oropharynx benign  Neck: Supple, no carotid bruits  Cardiac: Regular rate rhythm, no murmur  Musculoskeletal: No deformity   Neurological examination   Mentation: Alert oriented to time, place, history  taking. Attention span and concentration appropriate. Recent and remote memory intact.  Follows all commands speech and language fluent.   Cranial nerve II-XII: Pupils were equal round reactive to light extraocular movements were full, visual field were full on confrontational test. Facial sensation and strength were normal. hearing was intact to finger rubbing bilaterally. Uvula tongue midline. head turning and shoulder shrug were normal and symmetric.Tongue protrusion into cheek strength was normal. Motor: normal bulk and tone, full strength in the BUE, BLE, Except mild left hand grip weakness Sensory: normal and symmetric to light touch, pinprick, and  Vibration, in the upper and lower extremities  Coordination: finger-nose-finger, heel-to-shin bilaterally, no dysmetria Reflexes: 1+ upper lower and symmetric plantar responses were flexor bilaterally. Gait and Station: Rising up from seated position  without assistance, normal stance,  moderate stride, good arm swing, smooth turning, able to perform tiptoe, and heel walking without difficulty. Tandem gait is steady. No assistive device  DIAGNOSTIC DATA (LABS, IMAGING, TESTING) - I reviewed patient records, labs, notes, testing and imaging myself where available.  Lab Results  Component Value Date   WBC 11.9 (H) 01/23/2017   HGB 12.7 (L) 01/23/2017   HCT 38.6 (L) 01/23/2017   MCV 88.1 01/23/2017   PLT 263 01/23/2017      Component Value Date/Time   NA 135 01/23/2017 2125   K 4.2 01/23/2017 2125   CL 102 01/23/2017 2125   CO2 22 01/23/2017 2125   GLUCOSE 260 (H) 01/23/2017 2125   BUN 25 (H) 01/23/2017 2125   CREATININE 1.58 (H) 01/23/2017 2125   CALCIUM 9.3 01/23/2017 2125   GFRNONAA 44 (L) 01/23/2017 2125   GFRAA 51 (L) 01/23/2017 2125   Lab Results  Component Value Date   CHOL 147 01/24/2017   HDL 41 01/24/2017   LDLCALC 91 01/24/2017   TRIG 74 01/24/2017   CHOLHDL 3.6 01/24/2017   Lab Results  Component Value Date    HGBA1C 10.7 (H) 01/24/2017    ASSESSMENT AND PLAN  66 y.o. year old male  has a past medical history of Coronary artery disease; Diabetes mellitus without complication ([place]); Hypertension; Renal disorder; and Thyroid disease. And stroke here  For  follow-up. Patient was diagnosed with obstructive sleep apnea and here for first compliance.Compliance data dated 07/11/2017 through 08/09/2017 shows usage greater than 4 hours at 63% for 19 days. Average usage 6 hours 32 minutes. Set pressure 6 cm. EPR 1. AHI 4. No significant leaks   PLAN: Stressed the importance of management of risk factors to prevent further stroke Continue aspirin for secondary stroke prevention.  Maintain strict control of hypertension with blood pressure goal below 130/90, today's reading 130/73 continue antihypertensive medications Control of diabetes with hemoglobin A1c below 6.5  continue diabetic medications and close follow up with MD Cholesterol with LDL cholesterol less than 70, followed by primary care,  continue Crestor Exercise  by walking, 30 minutes daily eat healthy diet with whole grains,  fresh fruits and vegetables CPAP compliance 63% needs to be greater than 70% Follow in 2 months for CPAP compliance with Dr. Rexene Alberts Follow up 6 months with me For stroke follow up  I spent 25 minutes in total face to face time with the patient more than 50% of which was spent counseling and coordination of care, reviewing test results reviewing medications and discussing and reviewing the diagnosis of stroke and management of risk factors as well as obstructive sleep apnea and compliance and further treatment options. , Dennie Bible, [place], APRN Late entry Compliance data 05/20/17-06/18/17 Used 23 of 30 days for 77% compliance. 21 of 30 days > than four hours. For 70% compliance. Average usage 6 hours 27 min. CPAP at 6cm. AHI 5.0  [facility] [address], [place],  [address] [phone]  I reviewed the above note and documentation by the Nurse Practitioner and agree with the history, physical exam, assessment and plan as outlined above. I was immediately available for face-to-face consultation. Star Age, MD, PhD [facility])

## 2017-08-11 ENCOUNTER — Ambulatory Visit (INDEPENDENT_AMBULATORY_CARE_PROVIDER_SITE_OTHER): Payer: Medicare Other | Admitting: Nurse Practitioner

## 2017-08-11 ENCOUNTER — Encounter: Payer: Self-pay | Admitting: Nurse Practitioner

## 2017-08-11 VITALS — BP 130/73 | HR 53 | Ht 71.0 in | Wt 247.0 lb

## 2017-08-11 DIAGNOSIS — I639 Cerebral infarction, unspecified: Secondary | ICD-10-CM | POA: Diagnosis not present

## 2017-08-11 DIAGNOSIS — I10 Essential (primary) hypertension: Secondary | ICD-10-CM | POA: Diagnosis not present

## 2017-08-11 DIAGNOSIS — E785 Hyperlipidemia, unspecified: Secondary | ICD-10-CM | POA: Diagnosis not present

## 2017-08-11 DIAGNOSIS — G4733 Obstructive sleep apnea (adult) (pediatric): Secondary | ICD-10-CM

## 2017-08-11 DIAGNOSIS — E1169 Type 2 diabetes mellitus with other specified complication: Secondary | ICD-10-CM | POA: Diagnosis not present

## 2017-08-11 NOTE — Progress Notes (Signed)
I reviewed above note and agree with the assessment and plan.  Rosalin Hawking, MD PhD Stroke Neurology 08/11/2017 5:15 PM

## 2017-08-11 NOTE — Patient Instructions (Signed)
Stressed the importance of management of risk factors to prevent further stroke Continue aspirin for secondary stroke prevention.  Maintain strict control of hypertension with blood pressure goal below 130/90, today's reading 130/73 continue antihypertensive medications Control of diabetes with hemoglobin A1c below 6.5  continue diabetic medications and close follow up with MD Cholesterol with LDL cholesterol less than 70, followed by primary care,  continue Crestor Exercise  by walking, 30 minutes daily eat healthy diet with whole grains,  fresh fruits and vegetables CPAP compliance 63% Follow in 2 months for CPAP compliance with Dr. Rexene Alberts Follow up 6 months with me

## 2017-08-14 ENCOUNTER — Telehealth: Payer: Self-pay | Admitting: Nurse Practitioner

## 2017-08-14 NOTE — Telephone Encounter (Signed)
Spoke to [place] at [employer].  [phone].  FAX [phone].  She stated that pt has received 70% CPAP Compliance from the dates 05/16/17 thru 06/18/17.  Download (compliance report) was printed and given to CM/NP (in inbox) to addend note when pt in the office 08/11/17.  This is for [facility] compliance.  To fax addended note to [place] at the above fax #.

## 2017-08-14 NOTE — Telephone Encounter (Signed)
LMVM fo pt.

## 2017-08-14 NOTE — Telephone Encounter (Signed)
Note addended

## 2017-08-14 NOTE — Telephone Encounter (Signed)
Pt called said he has been advised by [facility] he needs to see Dr Rexene Alberts by 10/10. Pt saw Hoyle Sauer on 10/1 for compliance on CPAP. I spoke with Cyril Mourning, she thinks maybe DME does not have OV. Please call to discuss.

## 2017-08-15 NOTE — Telephone Encounter (Signed)
Faxed ovf note with addended late entry (received confirmation).   to [facility].  [phone].  Attention Leda Gauze.

## 2017-08-15 NOTE — Telephone Encounter (Signed)
Spoke to pt and relayed and faxed addended ofv note to [place] for [facility] compliance for his cpap.  I will check on needing 2 mo f/u with Dr. Rexene Alberts or keep 6 month appt.

## 2017-08-21 NOTE — Telephone Encounter (Signed)
LMVM for pt to return call for ? About his upcoming appts. (keep 21mo? For wait until 6 mo?)

## 2017-08-25 NOTE — Telephone Encounter (Signed)
Will keep 6 month appt as scheduled and cancel the 2 month.  Spoke to pt and he is aware.

## 2017-09-17 ENCOUNTER — Ambulatory Visit: Payer: Medicare Other | Admitting: Nurse Practitioner

## 2017-10-14 ENCOUNTER — Ambulatory Visit: Payer: Medicare Other | Admitting: Neurology

## 2017-11-26 ENCOUNTER — Telehealth: Payer: Self-pay | Admitting: Neurology

## 2017-11-26 DIAGNOSIS — Z9989 Dependence on other enabling machines and devices: Principal | ICD-10-CM

## 2017-11-26 DIAGNOSIS — G4733 Obstructive sleep apnea (adult) (pediatric): Secondary | ICD-10-CM

## 2017-11-26 NOTE — Addendum Note (Signed)
Addended by: Lester Mapleton A on: 11/26/2017 04:50 PM   Modules accepted: Orders

## 2017-11-26 NOTE — Telephone Encounter (Signed)
I spoke with Dr. Rexene Alberts. She gave a VO for pt's mask of choice for his cpap. Order placed. Will fax to [facility].

## 2017-11-26 NOTE — Telephone Encounter (Signed)
A lady lvm from [facility] she stated pt wants a different mask. She asked if you could send a new order over and on the order put mask of patience choice. She can be reached at [phone]. Fax number is [phone].

## 2018-02-10 NOTE — Progress Notes (Addendum)
[facility]  PATIENT: Thales Knipple DOB: [birth year]   REASON FOR VISIT:  follow-up for stroke, CPAP compliance HISTORY FROM: Patient and wife    HISTORY OF PRESENT ILLNESS: UPDATE 4/3/2019CM Mr. Doberstein, 67 year old male returns for follow-up with a history of stroke event in March 2018.  He is currently on aspirin 0.81 and Plavix was restarted by his cardiologist.  He has not had further stroke or TIA symptoms.  He has minimal bruising and no bleeding.  Blood pressure in the office today 127/71.  He remains on Crestor without myalgias.  Reviewed labs from 12/04/2017.  LDL 83, total cholesterol 145, triglycerides 78 CMP within normal limits, CBC mildly anemic at 12.0 hemoglobin A1c 8.6.  He exercises intermittently by walking and was encouraged to increase that to daily.  He continues to follow-up closely with endocrinology in [place] for his diabetes.  He has been on CPAP for about 5 months now he continues to struggle with compliance.  Data dated 12/13/2017 to 02/10/2018 shows greater than 4 hours compliance at 60%.  Average usage 4 hours 3 minutes.  Set pressure 6 cm AHI 2.1 no leaks.  He returns for reevaluation   UPDATE 10/1/18CM Mr. Cogbill, 67 year old male returns for follow-up with history of stroke event in March 2018. He is currently on aspirin 325 daily without recurrent stroke or TIA symptoms. He has minimal bruising and bleeding. Blood pressure in the office today 130/73. He remains on Crestor without myalgias. He exercises 10-15 minutes by walking daily. He is diabetic and remains on insulin with close follow-up with endocrinology in [place]. He has been using CPAP for about 2 months. He is still getting used to it .Compliance data dated 07/11/2017 through 08/09/2017 shows usage greater than 4 hours at 63% for 19 days. Average usage 6 hours 32 minutes. Set pressure 6 cm. EPR 1. AHI 4. No significant leaks. He did have pneumonia in September  and could not use  his CPAP during that time. He returns for reevaluation   HISTORY 03/10/17 CM Mr. Mroczka, 67 year old male returns for hospital follow-up after admission for trouble walking for a couple of days. He also had some slurring of speech. He has past medical history of diabetes ,coronary artery disease.  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 Marland Kitchen 2-D echo EF 60-65%. LDL 91 hemoglobin A1c 10.7. He was placed on aspirin and Plavix which he is to continue for 3 months and then either aspirin or Plavix alone due to intracranial stenosis. Crestor was increased to 40 mg daily. He is on sliding scale for his diabetes. His sleep study was positive for obstructive sleep apnea. He needs to be titrated her CPAP. He returns for reevaluation   REVIEW OF SYSTEMS: Full 14 system review of systems performed and notable only for those listed, all others are neg:  Constitutional: Fatigue Cardiovascular: neg Ear/Nose/Throat: neg  Skin: neg Eyes: neg Respiratory: Cough Gastroitestinal: neg  Hematology/Lymphatic: Anemia Endocrine: neg Musculoskeletal:neg Allergy/Immunology: neg Neurological: neg,  Psychiatric: Anxiety  Sleep : Obstructive sleep apnea with CPAP   ALLERGIES: Allergies  Allergen Reactions  . Diphenhydramine Hcl Other (See Comments)    "The more I take, it makes my prostate swell..."  . Ezetimibe Other (See Comments)    Unknown  . Lopressor [Metoprolol] Itching  . Metformin Other (See Comments)    Unknown  .  Other Itching and Other (See Comments)    FA Dye Prostate swelling  . Sulfa Antibiotics Other (See Comments)    Unknown    HOME MEDICATIONS: Outpatient Medications Prior to Visit  Medication Sig Dispense Refill  . albuterol (PROVENTIL HFA;VENTOLIN HFA) 108 (90 Base) MCG/ACT inhaler Inhale 2 puffs into the lungs every 6 (six) hours as needed for wheezing  or shortness of breath.     . ALPRAZolam (XANAX) 0.5 MG tablet Take 0.5 mg by mouth at bedtime as needed for sleep.     Marland Kitchen amLODipine (NORVASC) 10 MG tablet Take 10 mg by mouth daily.    Marland Kitchen aspirin 81 MG chewable tablet Chew by mouth.    . budesonide (PULMICORT) 180 MCG/ACT inhaler Inhale 1 puff into the lungs 2 (two) times daily as needed (for shortness of breath).     . clopidogrel (PLAVIX) 75 MG tablet Take 75 mg by mouth daily.    . fluticasone (FLONASE) 50 MCG/ACT nasal spray Place 2 sprays into both nostrils daily as needed for allergies or rhinitis.    Marland Kitchen insulin glargine (LANTUS) 100 UNIT/ML injection Inject 66 Units into the skin daily.     . insulin regular (NOVOLIN R,HUMULIN R) 100 units/mL injection Inject 12 to 26 units subcutaneously three times a day before each meal per sliding scale    . levothyroxine (SYNTHROID) 50 MCG tablet Take 50 mcg by mouth daily before breakfast.     . lisinopril (PRINIVIL,ZESTRIL) 40 MG tablet Take 40 mg by mouth daily.     . metoprolol tartrate (LOPRESSOR) 25 MG tablet Take 25 mg by mouth 2 (two) times daily.     . nitroGLYCERIN (NITROSTAT) 0.4 MG SL tablet Place 0.4 mg under the tongue every 5 (five) minutes as needed for chest pain.     . Omega-3 Fatty Acids (FISH OIL) 1000 MG CAPS Take 1,000 mg by mouth daily.     . rosuvastatin (CRESTOR) 40 MG tablet Take 40 mg by mouth daily.     . tamsulosin (FLOMAX) 0.4 MG CAPS capsule Take 0.4 mg by mouth daily after supper.     . Vitamin D, Ergocalciferol, (DRISDOL) 50000 units CAPS capsule Take 1 tablet by mouth twice each week    . aspirin 325 MG tablet Take 1 tablet (325 mg total) by mouth daily. 90 tablet 0   No facility-administered medications prior to visit.     PAST MEDICAL HISTORY: Past Medical History:  Diagnosis Date  . Coronary artery disease   . Diabetes mellitus without complication ([place])   . Hypertension   . Renal disorder   . Thyroid disease     PAST SURGICAL HISTORY: Past Surgical  History:  Procedure Laterality Date  . EXPLORATION POST OPERATIVE OPEN HEART      FAMILY HISTORY: Family History  Problem Relation Age of Onset  . Atrial fibrillation Mother   . Heart disease Mother   . Stroke Mother   . Diabetes Father   . Stroke Brother   . Diabetes Brother   . Lung cancer Brother   . Heart attack Brother     SOCIAL HISTORY: See above in HPI.   PHYSICAL EXAM  Vitals:   02/11/18 1010  BP: 127/71  Pulse: (!) 55  Weight: 253 lb 6.4 oz (114.9 kg)  Height: 5\' 11"  (1.803 m)   Body mass index is 35.34 kg/m.  Generalized: Well developed, in no acute distress  Head: normocephalic and atraumatic,. Oropharynx benign  Neck: Supple, no carotid bruits  Cardiac: Regular rate rhythm, no murmur  Musculoskeletal: No deformity   Neurological examination   Mentation: Alert oriented to time, place, history taking. Attention span and concentration appropriate. Recent and remote memory intact.  Follows all commands speech and language fluent.   Cranial nerve II-XII: Pupils were equal round reactive to light extraocular movements were full, visual field were full on confrontational test. Facial sensation and strength were normal. hearing was intact to finger rubbing bilaterally. Uvula tongue midline. head turning and shoulder shrug were normal and symmetric.Tongue protrusion into cheek strength was normal. Motor: normal bulk and tone, full strength in the BUE, BLE, Except mild left hand grip weakness Sensory: normal and symmetric to light touch, pinprick, and  Vibration, in the upper and lower extremities  Coordination: finger-nose-finger, heel-to-shin bilaterally, no dysmetria Reflexes: 1+ upper lower and symmetric plantar responses were flexor bilaterally. Gait and Station: Rising up from seated position without assistance, normal stance,  moderate stride, good arm swing, smooth turning, able to perform tiptoe, and heel walking without difficulty. Tandem gait is steady.  No assistive device  DIAGNOSTIC DATA (LABS, IMAGING, TESTING) - I reviewed patient records, labs, notes, testing and imaging myself where available.  Lab Results  Component Value Date   WBC 11.9 (H) 01/23/2017   HGB 12.7 (L) 01/23/2017   HCT 38.6 (L) 01/23/2017   MCV 88.1 01/23/2017   PLT 263 01/23/2017      Component Value Date/Time   NA 135 01/23/2017 2125   K 4.2 01/23/2017 2125   CL 102 01/23/2017 2125   CO2 22 01/23/2017 2125   GLUCOSE 260 (H) 01/23/2017 2125   BUN 25 (H) 01/23/2017 2125   CREATININE 1.58 (H) 01/23/2017 2125   CALCIUM 9.3 01/23/2017 2125   GFRNONAA 44 (L) 01/23/2017 2125   GFRAA 51 (L) 01/23/2017 2125   Lab Results  Component Value Date   CHOL 147 01/24/2017   HDL 41 01/24/2017   LDLCALC 91 01/24/2017   TRIG 74 01/24/2017   CHOLHDL 3.6 01/24/2017   Lab Results  Component Value Date   HGBA1C 10.7 (H) 01/24/2017    ASSESSMENT AND PLAN  67 y.o. year old male  has a past medical history of Coronary artery disease, Diabetes mellitus without complication ([place]), Hypertension, Renal disorder, and Thyroid disease. And stroke here  For  follow-up. Patient was diagnosed with obstructive sleep apnea and here for  compliance.Data dated 12/13/2017 to 02/10/2018 shows greater than 4 hours compliance at 60%.  Average usage 4 hours 3 minutes.  Set pressure 6 cm AHI 2.1 leak 95th percentile 12.2.    PLAN: Stressed the importance of management of risk factors to prevent further stroke Continue aspirin and Plavix for secondary stroke prevention.  Maintain strict control of hypertension with blood pressure goal below 130/90, today's reading 127/71 continue antihypertensive medications Control of diabetes with hemoglobin A1c below 6.5  continue diabetic medications and close follow up with MD most recent hemoglobin A1c 8.6 Cholesterol with LDL cholesterol less than 70, followed by primary care,  continue Crestor most recent LDL 83 Exercise  by walking, 30 minutes  daily eat healthy diet with whole grains,  fresh fruits and vegetables CPAP compliance 60% needs to be greater than 70% Follow up 3 months with me For CPAP compliance  Discharge from stroke clinic I spent 25 minutes in total face to face time with the patient more than 50% of which was spent counseling and coordination of care, reviewing test results reviewing medications and discussing and reviewing  the diagnosis of stroke and management of risk factors as well as obstructive sleep apnea and compliance and further treatment options. , Dennie Bible, [facility], [facility], APRN [facility] [address] [place], [place] [address] [phone]  I reviewed the above note and documentation by the Nurse Practitioner and agree with the history, physical exam, assessment and plan as outlined above. I was immediately available for face-to-face consultation. Star Age, MD, PhD [facility])

## 2018-02-11 ENCOUNTER — Encounter: Payer: Self-pay | Admitting: Nurse Practitioner

## 2018-02-11 ENCOUNTER — Ambulatory Visit (INDEPENDENT_AMBULATORY_CARE_PROVIDER_SITE_OTHER): Payer: Medicare Other | Admitting: Nurse Practitioner

## 2018-02-11 VITALS — BP 127/71 | HR 55 | Ht 71.0 in | Wt 253.4 lb

## 2018-02-11 DIAGNOSIS — G4733 Obstructive sleep apnea (adult) (pediatric): Secondary | ICD-10-CM | POA: Diagnosis not present

## 2018-02-11 DIAGNOSIS — E785 Hyperlipidemia, unspecified: Secondary | ICD-10-CM | POA: Diagnosis not present

## 2018-02-11 DIAGNOSIS — I10 Essential (primary) hypertension: Secondary | ICD-10-CM | POA: Diagnosis not present

## 2018-02-11 DIAGNOSIS — Z9989 Dependence on other enabling machines and devices: Secondary | ICD-10-CM | POA: Diagnosis not present

## 2018-02-11 DIAGNOSIS — Z8673 Personal history of transient ischemic attack (TIA), and cerebral infarction without residual deficits: Secondary | ICD-10-CM | POA: Diagnosis not present

## 2018-02-11 NOTE — Patient Instructions (Signed)
Stressed the importance of management of risk factors to prevent further stroke Continue aspirin and Plavix for secondary stroke prevention.  Maintain strict control of hypertension with blood pressure goal below 130/90, today's reading 127/71 continue antihypertensive medications Control of diabetes with hemoglobin A1c below 6.5  continue diabetic medications and close follow up with MD most recent hemoglobin A1c 8.6 Cholesterol with LDL cholesterol less than 70, followed by primary care,  continue Crestor most recent LDL 83 Exercise  by walking, 30 minutes daily eat healthy diet with whole grains,  fresh fruits and vegetables CPAP compliance 60% needs to be greater than 70% Follow up 3 months with me For CPAP compliance

## 2018-02-11 NOTE — Progress Notes (Signed)
Sample, BAYER Aspirin LOT NAA 61L2 exp 11/2018.  [place] 0280-2100-32  (one bottle).

## 2018-02-11 NOTE — Progress Notes (Signed)
I reviewed above note and agree with the assessment and plan.   Rosalin Hawking, MD PhD Stroke Neurology 02/11/2018 10:01 PM

## 2018-02-23 ENCOUNTER — Encounter: Payer: Self-pay | Admitting: *Deleted

## 2018-02-24 ENCOUNTER — Encounter
Admission: RE | Disposition: A | Discharge status: Home / Self Care | Payer: Self-pay | Source: Ambulatory Visit | Attending: Internal Medicine

## 2018-02-24 ENCOUNTER — Ambulatory Visit: Payer: Medicare Other | Admitting: Anesthesiology

## 2018-02-24 ENCOUNTER — Other Ambulatory Visit: Payer: Self-pay

## 2018-02-24 ENCOUNTER — Ambulatory Visit
Admission: RE | Admit: 2018-02-24 | Discharge: 2018-02-24 | Disposition: A | Discharge status: Home / Self Care | Payer: Medicare Other | Source: Ambulatory Visit | Attending: Internal Medicine | Admitting: Internal Medicine

## 2018-02-24 ENCOUNTER — Encounter: Payer: Self-pay | Admitting: *Deleted

## 2018-02-24 DIAGNOSIS — E7800 Pure hypercholesterolemia, unspecified: Secondary | ICD-10-CM | POA: Diagnosis not present

## 2018-02-24 DIAGNOSIS — Z794 Long term (current) use of insulin: Secondary | ICD-10-CM | POA: Insufficient documentation

## 2018-02-24 DIAGNOSIS — E782 Mixed hyperlipidemia: Secondary | ICD-10-CM | POA: Diagnosis not present

## 2018-02-24 DIAGNOSIS — I69398 Other sequelae of cerebral infarction: Secondary | ICD-10-CM | POA: Insufficient documentation

## 2018-02-24 DIAGNOSIS — D3A8 Other benign neuroendocrine tumors: Secondary | ICD-10-CM | POA: Diagnosis not present

## 2018-02-24 DIAGNOSIS — D124 Benign neoplasm of descending colon: Secondary | ICD-10-CM | POA: Diagnosis not present

## 2018-02-24 DIAGNOSIS — I10 Essential (primary) hypertension: Secondary | ICD-10-CM | POA: Insufficient documentation

## 2018-02-24 DIAGNOSIS — E039 Hypothyroidism, unspecified: Secondary | ICD-10-CM | POA: Insufficient documentation

## 2018-02-24 DIAGNOSIS — E11319 Type 2 diabetes mellitus with unspecified diabetic retinopathy without macular edema: Secondary | ICD-10-CM | POA: Diagnosis not present

## 2018-02-24 DIAGNOSIS — Z888 Allergy status to other drugs, medicaments and biological substances status: Secondary | ICD-10-CM | POA: Insufficient documentation

## 2018-02-24 DIAGNOSIS — K219 Gastro-esophageal reflux disease without esophagitis: Secondary | ICD-10-CM | POA: Diagnosis not present

## 2018-02-24 DIAGNOSIS — Z9989 Dependence on other enabling machines and devices: Secondary | ICD-10-CM | POA: Insufficient documentation

## 2018-02-24 DIAGNOSIS — Z79899 Other long term (current) drug therapy: Secondary | ICD-10-CM | POA: Insufficient documentation

## 2018-02-24 DIAGNOSIS — M109 Gout, unspecified: Secondary | ICD-10-CM | POA: Diagnosis not present

## 2018-02-24 DIAGNOSIS — E1139 Type 2 diabetes mellitus with other diabetic ophthalmic complication: Secondary | ICD-10-CM | POA: Diagnosis not present

## 2018-02-24 DIAGNOSIS — E559 Vitamin D deficiency, unspecified: Secondary | ICD-10-CM | POA: Insufficient documentation

## 2018-02-24 DIAGNOSIS — G4730 Sleep apnea, unspecified: Secondary | ICD-10-CM | POA: Insufficient documentation

## 2018-02-24 DIAGNOSIS — G4733 Obstructive sleep apnea (adult) (pediatric): Secondary | ICD-10-CM | POA: Insufficient documentation

## 2018-02-24 DIAGNOSIS — H42 Glaucoma in diseases classified elsewhere: Secondary | ICD-10-CM | POA: Insufficient documentation

## 2018-02-24 DIAGNOSIS — Z7902 Long term (current) use of antithrombotics/antiplatelets: Secondary | ICD-10-CM | POA: Insufficient documentation

## 2018-02-24 DIAGNOSIS — K921 Melena: Secondary | ICD-10-CM | POA: Diagnosis present

## 2018-02-24 DIAGNOSIS — G4700 Insomnia, unspecified: Secondary | ICD-10-CM | POA: Diagnosis not present

## 2018-02-24 DIAGNOSIS — I2510 Atherosclerotic heart disease of native coronary artery without angina pectoris: Secondary | ICD-10-CM | POA: Insufficient documentation

## 2018-02-24 DIAGNOSIS — F411 Generalized anxiety disorder: Secondary | ICD-10-CM | POA: Insufficient documentation

## 2018-02-24 DIAGNOSIS — J45909 Unspecified asthma, uncomplicated: Secondary | ICD-10-CM | POA: Diagnosis not present

## 2018-02-24 DIAGNOSIS — Z86010 Personal history of colonic polyps: Secondary | ICD-10-CM | POA: Insufficient documentation

## 2018-02-24 DIAGNOSIS — K640 First degree hemorrhoids: Secondary | ICD-10-CM | POA: Diagnosis not present

## 2018-02-24 DIAGNOSIS — R2689 Other abnormalities of gait and mobility: Secondary | ICD-10-CM | POA: Insufficient documentation

## 2018-02-24 DIAGNOSIS — Z7982 Long term (current) use of aspirin: Secondary | ICD-10-CM | POA: Insufficient documentation

## 2018-02-24 DIAGNOSIS — K5730 Diverticulosis of large intestine without perforation or abscess without bleeding: Secondary | ICD-10-CM | POA: Insufficient documentation

## 2018-02-24 DIAGNOSIS — Z882 Allergy status to sulfonamides status: Secondary | ICD-10-CM | POA: Insufficient documentation

## 2018-02-24 DIAGNOSIS — N400 Enlarged prostate without lower urinary tract symptoms: Secondary | ICD-10-CM | POA: Diagnosis not present

## 2018-02-24 DIAGNOSIS — I252 Old myocardial infarction: Secondary | ICD-10-CM | POA: Diagnosis not present

## 2018-02-24 HISTORY — DX: Atopic dermatitis, unspecified: L20.9

## 2018-02-24 HISTORY — DX: Benign prostatic hyperplasia without lower urinary tract symptoms: N40.0

## 2018-02-24 HISTORY — DX: Hypothyroidism, unspecified: E03.9

## 2018-02-24 HISTORY — DX: Polyp of colon: K63.5

## 2018-02-24 HISTORY — DX: Anemia, unspecified: D64.9

## 2018-02-24 HISTORY — DX: Atherosclerotic heart disease of native coronary artery without angina pectoris: I25.10

## 2018-02-24 HISTORY — DX: Insomnia, unspecified: G47.00

## 2018-02-24 HISTORY — DX: Unspecified asthma, uncomplicated: J45.909

## 2018-02-24 HISTORY — DX: Gout, unspecified: M10.9

## 2018-02-24 HISTORY — DX: Vitamin D deficiency, unspecified: E55.9

## 2018-02-24 HISTORY — DX: Gastro-esophageal reflux disease without esophagitis: K21.9

## 2018-02-24 HISTORY — DX: Mixed hyperlipidemia: E78.2

## 2018-02-24 HISTORY — DX: Constipation, unspecified: K59.00

## 2018-02-24 HISTORY — DX: Disorder of arteries and arterioles, unspecified: I77.9

## 2018-02-24 HISTORY — DX: Peripheral vascular disease, unspecified: I73.9

## 2018-02-24 HISTORY — DX: Sleep apnea, unspecified: G47.30

## 2018-02-24 HISTORY — DX: Cerebral infarction, unspecified: I63.9

## 2018-02-24 HISTORY — DX: Acute myocardial infarction, unspecified: I21.9

## 2018-02-24 HISTORY — DX: Hypomagnesemia: E83.42

## 2018-02-24 HISTORY — DX: Unspecified glaucoma: H40.9

## 2018-02-24 HISTORY — DX: Personal history of other diseases of the digestive system: Z87.19

## 2018-02-24 HISTORY — DX: Disorder of kidney and ureter, unspecified: N28.9

## 2018-02-24 HISTORY — DX: Anxiety disorder, unspecified: F41.9

## 2018-02-24 HISTORY — DX: Type 2 diabetes mellitus with unspecified diabetic retinopathy without macular edema: E11.319

## 2018-02-24 HISTORY — PX: COLONOSCOPY WITH PROPOFOL: SHX5780

## 2018-02-24 HISTORY — DX: Pure hypercholesterolemia, unspecified: E78.00

## 2018-02-24 LAB — GLUCOSE, CAPILLARY
GLUCOSE-CAPILLARY: 158 mg/dL — AB (ref 65–99)
Glucose-Capillary: 77 mg/dL (ref 65–99)

## 2018-02-24 SURGERY — COLONOSCOPY WITH PROPOFOL
Anesthesia: General

## 2018-02-24 MED ORDER — LIDOCAINE HCL (PF) 2 % IJ SOLN
INTRAMUSCULAR | Status: AC
Start: 2018-02-24 — End: 2018-02-24
  Filled 2018-02-24: qty 10

## 2018-02-24 MED ORDER — PHENYLEPHRINE HCL 10 MG/ML IJ SOLN
INTRAMUSCULAR | Status: AC
Start: 2018-02-24 — End: 2018-02-24
  Filled 2018-02-24: qty 1

## 2018-02-24 MED ORDER — PROPOFOL 500 MG/50ML IV EMUL
INTRAVENOUS | Status: AC
  Filled 2018-02-24: qty 50

## 2018-02-24 MED ORDER — SODIUM CHLORIDE 0.9 % IV SOLN
INTRAVENOUS | Status: DC
  Administered 2018-02-24: 08:00:00 via INTRAVENOUS

## 2018-02-24 MED ORDER — DEXTROSE 5 % IV SOLN
INTRAVENOUS | Status: DC
  Administered 2018-02-24: 08:00:00 via INTRAVENOUS

## 2018-02-24 MED ORDER — MIDAZOLAM HCL 2 MG/2ML IJ SOLN
INTRAMUSCULAR | Status: AC
  Filled 2018-02-24: qty 2

## 2018-02-24 MED ORDER — PROPOFOL 10 MG/ML IV BOLUS
INTRAVENOUS | Status: DC | PRN
  Administered 2018-02-24: 70 mg via INTRAVENOUS

## 2018-02-24 MED ORDER — PROPOFOL 500 MG/50ML IV EMUL
INTRAVENOUS | Status: DC | PRN
  Administered 2018-02-24: 140 ug/kg/min via INTRAVENOUS

## 2018-02-24 MED ORDER — MIDAZOLAM HCL 5 MG/5ML IJ SOLN
INTRAMUSCULAR | Status: DC | PRN
  Administered 2018-02-24: 1 mg via INTRAVENOUS

## 2018-02-24 MED ORDER — LIDOCAINE HCL (PF) 2 % IJ SOLN
INTRAMUSCULAR | Status: AC
  Filled 2018-02-24: qty 10

## 2018-02-24 MED ORDER — PROPOFOL 500 MG/50ML IV EMUL
INTRAVENOUS | Status: AC
Start: 2018-02-24 — End: 2018-02-24
  Filled 2018-02-24: qty 50

## 2018-02-24 MED ORDER — EPHEDRINE SULFATE 50 MG/ML IJ SOLN
INTRAMUSCULAR | Status: AC
  Filled 2018-02-24: qty 1

## 2018-02-24 MED ORDER — GLYCOPYRROLATE 0.2 MG/ML IJ SOLN
INTRAMUSCULAR | Status: AC
  Filled 2018-02-24: qty 1

## 2018-02-24 NOTE — Anesthesia Postprocedure Evaluation (Signed)
Anesthesia Post Note  Patient: Roberto Ingram  Procedure(s) Performed: COLONOSCOPY WITH PROPOFOL (N/A )  Patient location during evaluation: Endoscopy Anesthesia Type: General Level of consciousness: awake and alert Pain management: pain level controlled Vital Signs Assessment: post-procedure vital signs reviewed and stable Respiratory status: spontaneous breathing and respiratory function stable Cardiovascular status: stable Anesthetic complications: no     Last Vitals:  Vitals:   02/24/18 0852 02/24/18 0900  BP: (!) 99/46 (!) 94/48  Pulse: (!) 52 (!) 47  Resp: 19 18  Temp: (!) 35.8 C   SpO2: 98% 99%    Last Pain:  Vitals:   02/24/18 0850  TempSrc: Tympanic  PainSc:                  KEPHART,WILLIAM K

## 2018-02-24 NOTE — Transfer of Care (Signed)
Immediate Anesthesia Transfer of Care Note  Patient: CAYLE THUNDER  Procedure(s) Performed: COLONOSCOPY WITH PROPOFOL (N/A )  Patient Location: PACU  Anesthesia Type:General  Level of Consciousness: awake, alert  and oriented  Airway & Oxygen Therapy: Patient Spontanous Breathing and Patient connected to nasal cannula oxygen  Post-op Assessment: Report given to RN and Post -op Vital signs reviewed and stable  Post vital signs: Reviewed and stable  Last Vitals:  Vitals Value Taken Time  BP 99/46 02/24/2018  8:52 AM  Temp 35.8 C 02/24/2018  8:52 AM  Pulse 50 02/24/2018  8:52 AM  Resp 18 02/24/2018  8:52 AM  SpO2 100 % 02/24/2018  8:52 AM  Vitals shown include unvalidated device data.  Last Pain:  Vitals:   02/24/18 0850  TempSrc: Tympanic  PainSc:          Complications: No apparent anesthesia complications

## 2018-02-24 NOTE — Interval H&P Note (Signed)
History and Physical Interval Note:  02/24/2018 8:31 AM  Roberto Ingram  has presented today for surgery, with the diagnosis of RECTAL BLEEDING  The various methods of treatment have been discussed with the patient and family. After consideration of risks, benefits and other options for treatment, the patient has consented to  Procedure(s): COLONOSCOPY WITH PROPOFOL (N/A) as a surgical intervention .  The patient's history has been reviewed, patient examined, no change in status, stable for surgery.  I have reviewed the patient's chart and labs.  Questions were answered to the patient's satisfaction.     [place], [place]

## 2018-02-24 NOTE — Anesthesia Preprocedure Evaluation (Signed)
Anesthesia Evaluation  Patient identified by MRN, date of birth, ID band Patient awake    Reviewed: Allergy & Precautions, NPO status , Patient's Chart, lab work & pertinent test results, reviewed documented beta blocker date and time   History of Anesthesia Complications Negative for: history of anesthetic complications  Airway Mallampati: II       Dental   Pulmonary asthma , sleep apnea and Continuous Positive Airway Pressure Ventilation , former smoker,           Cardiovascular hypertension, Pt. on medications and Pt. on home beta blockers + CAD, + Past MI and + Cardiac Stents  (-) CHF (-) dysrhythmias (-) Valvular Problems/Murmurs     Neuro/Psych Seizures - (as a child),  Anxiety CVA (balance problem)    GI/Hepatic Neg liver ROS, GERD  Medicated and Controlled,  Endo/Other  diabetes, Type 2, Insulin DependentHypothyroidism   Renal/GU Renal InsufficiencyRenal disease     Musculoskeletal   Abdominal   Peds  Hematology  (+) anemia ,   Anesthesia Other Findings   Reproductive/Obstetrics                             Anesthesia Physical Anesthesia Plan  ASA: III  Anesthesia Plan: General   Post-op Pain Management:    Induction: Intravenous  PONV Risk Score and Plan: 2 and TIVA and Propofol infusion  Airway Management Planned: Nasal Cannula  Additional Equipment:   Intra-op Plan:   Post-operative Plan:   Informed Consent: I have reviewed the patients History and Physical, chart, labs and discussed the procedure including the risks, benefits and alternatives for the proposed anesthesia with the patient or authorized representative who has indicated his/her understanding and acceptance.     Plan Discussed with:   Anesthesia Plan Comments:         Anesthesia Quick Evaluation

## 2018-02-24 NOTE — Anesthesia Post-op Follow-up Note (Signed)
Anesthesia QCDR form completed.        

## 2018-02-24 NOTE — Op Note (Signed)
[facility] Gastroenterology Patient Name: Roberto Ingram Procedure Date: 02/24/2018 7:44 AM MRN: [id number] Account #: [id number] Date of Birth: [birth year] Admit Type: Outpatient Age: 67 Room: [facility] ENDO ROOM 2 Gender: Male Note Status: Finalized Procedure:            Colonoscopy Indications:          Hematochezia Providers:            Benay Pike. Alice Reichert MD, MD Referring MD:         Renee Rival (Referring MD), Lavone Orn, MD Medicines:            Propofol per Anesthesia Complications:        No immediate complications. Procedure:            Pre-Anesthesia Assessment:                       - The risks and benefits of the procedure and the                        sedation options and risks were discussed with the                        patient. All questions were answered and informed                        consent was obtained.                       - Patient identification and proposed procedure were                        verified prior to the procedure by the nurse. The                        procedure was verified in the procedure room.                       - ASA Grade Assessment: III - A patient with severe                        systemic disease.                       - After reviewing the risks and benefits, the patient                        was deemed in satisfactory condition to undergo the                        procedure.                       After obtaining informed consent, the colonoscope was                        passed under direct vision. Throughout the procedure,                        the patient's blood pressure, pulse, and oxygen  saturations were monitored continuously. The                        Colonoscope was introduced through the anus and                        advanced to the the cecum, identified by appendiceal                        orifice and ileocecal valve. The colonoscopy was    performed without difficulty. The patient tolerated the                        procedure well. The quality of the bowel preparation                        was good. The ileocecal valve, appendiceal orifice, and                        rectum were photographed. Findings:      The perianal and digital rectal examinations were normal. Pertinent       negatives include normal sphincter tone and no palpable rectal lesions.      A few small-mouthed diverticula were found in the sigmoid colon. There       was no evidence of diverticular bleeding.      A 3 mm polyp was found in the ileocecal valve. The polyp was sessile.       The polyp was removed with a jumbo cold forceps. Resection and retrieval       were complete.      Four sessile polyps were found in the distal sigmoid colon and       descending colon. The polyps were 2 to 4 mm in size. These polyps were       removed with a jumbo cold forceps. Resection and retrieval were complete.      Non-bleeding internal hemorrhoids were found during retroflexion. The       hemorrhoids were Grade I (internal hemorrhoids that do not prolapse).      The exam was otherwise without abnormality. Impression:           - Diverticulosis in the sigmoid colon. There was no                        evidence of diverticular bleeding.                       - One 3 mm polyp at the ileocecal valve, removed with a                        jumbo cold forceps. Resected and retrieved.                       - Four 2 to 4 mm polyps in the distal sigmoid colon and                        in the descending colon, removed with a jumbo cold                        forceps. Resected and retrieved.                       -  Non-bleeding internal hemorrhoids.                       - The examination was otherwise normal. Recommendation:       - Patient has a contact number available for                        emergencies. The signs and symptoms of potential                         delayed complications were discussed with the patient.                        Return to normal activities tomorrow. Written discharge                        instructions were provided to the patient.                       - Resume previous diet.                       - Continue present medications.                       - Repeat colonoscopy is recommended for surveillance.                        The colonoscopy date will be determined after pathology                        results from today's exam become available for review.                       - Return to GI office PRN.                       - The findings and recommendations were discussed with                        the patient and their family. Procedure Code(s):    --- Professional ---                       [phone], Colonoscopy, flexible; with biopsy, single or                        multiple Diagnosis Code(s):    --- Professional ---                       K64.0, First degree hemorrhoids                       D12.0, Benign neoplasm of cecum                       D12.5, Benign neoplasm of sigmoid colon                       D12.4, Benign neoplasm of descending colon                       K92.1, Melena (includes Hematochezia)  K57.30, Diverticulosis of large intestine without                        perforation or abscess without bleeding CPT copyright 2017 American [employer]. All rights reserved. The codes documented in this report are preliminary and upon coder review may  be revised to meet current compliance requirements. Efrain Sella MD, MD 02/24/2018 8:52:00 AM This report has been signed electronically. Number of Addenda: 0 Note Initiated On: 02/24/2018 7:44 AM Scope Withdrawal Time: 0 hours 9 minutes 46 seconds  Total Procedure Duration: 0 hours 12 minutes 5 seconds       [facility]

## 2018-02-24 NOTE — H&P (Signed)
Outpatient short stay form Pre-procedure 02/24/2018 8:28 AM Teodoro K. Alice Reichert, M.D.  Primary Physician: Hollice Gong, FNP  Reason for visit:  Rectal bleeding.   History of present illness: 67 year old male presents with a history of type 2 diabetes mellitus, insulin requiring, complaint of rectal bleeding.  He has occasional constipation with bright red blood per rectum after bowel movements.  No weight loss or abdominal pain.  Last colonoscopy was circa 2015.  That is reported as normal.    Current Facility-Administered Medications:  .  0.9 %  sodium chloride infusion, , Intravenous, Continuous, [place], Benay Pike, MD .  dextrose 5 % solution, , Intravenous, Continuous, [place], [place], MD  Medications Prior to Admission  Medication Sig Dispense Refill Last Dose  . albuterol (PROVENTIL HFA;VENTOLIN HFA) 108 (90 Base) MCG/ACT inhaler Inhale 2 puffs into the lungs every 6 (six) hours as needed for wheezing or shortness of breath.    02/23/2018 at 2200  . ALLOPURINOL PO Take by mouth daily.   Past Week at Unknown time  . ALPRAZolam (XANAX) 0.5 MG tablet Take 0.5 mg by mouth at bedtime as needed for sleep.    02/23/2018 at 2200  . amLODipine (NORVASC) 10 MG tablet Take 10 mg by mouth daily.   02/23/2018 at 2200  . aspirin 81 MG chewable tablet Chew by mouth.   02/23/2018 at 0900  . budesonide (PULMICORT) 180 MCG/ACT inhaler Inhale 1 puff into the lungs 2 (two) times daily as needed (for shortness of breath).    Past Week at Unknown time  . cetirizine (ZYRTEC) 10 MG tablet Take 10 mg by mouth daily.   02/23/2018 at 0900  . clopidogrel (PLAVIX) 75 MG tablet Take 75 mg by mouth daily.   Past Week at Unknown time  . colchicine 0.6 MG tablet Take 0.6 mg by mouth daily.   Past Month at Unknown time  . docusate sodium (COLACE) 100 MG capsule Take 100 mg by mouth 2 (two) times daily.   Past Month at Unknown time  . fluticasone (FLONASE) 50 MCG/ACT nasal spray Place 2 sprays into both nostrils daily  as needed for allergies or rhinitis.   02/23/2018 at 0900  . insulin glargine (LANTUS) 100 UNIT/ML injection Inject 66 Units into the skin daily.    02/23/2018 at 2200  . insulin regular (NOVOLIN R,HUMULIN R) 100 units/mL injection Inject 12 to 26 units subcutaneously three times a day before each meal per sliding scale   02/23/2018 at 0930  . levothyroxine (SYNTHROID) 50 MCG tablet Take 50 mcg by mouth daily before breakfast.    02/23/2018 at 0900  . lisinopril (PRINIVIL,ZESTRIL) 40 MG tablet Take 40 mg by mouth daily.    02/24/2018 at 0530  . metoprolol tartrate (LOPRESSOR) 25 MG tablet Take 25 mg by mouth 2 (two) times daily.    02/24/2018 at 0530  . Omega-3 Fatty Acids (FISH OIL) 1000 MG CAPS Take 1,000 mg by mouth daily.    02/23/2018 at 0900  . rosuvastatin (CRESTOR) 40 MG tablet Take 40 mg by mouth daily.    02/23/2018 at 2200  . Specialty Vitamins Products (MAGNESIUM, AMINO ACID CHELATE,) 133 MG tablet Take 2 tablets by mouth 2 (two) times daily.   Past Week at Unknown time  . tamsulosin (FLOMAX) 0.4 MG CAPS capsule Take 0.4 mg by mouth daily after supper.    02/23/2018 at 0900  . Vitamin D, Ergocalciferol, (DRISDOL) 50000 units CAPS capsule Take 1 tablet by mouth twice each week  02/23/2018 at 0900  . gabapentin (NEURONTIN) 300 MG capsule Take 300 mg by mouth daily.   Not Taking at Unknown time  . hydrochlorothiazide (HYDRODIURIL) 25 MG tablet Take 25 mg by mouth daily.   Not Taking at Unknown time  . meclizine (ANTIVERT) 25 MG tablet Take 25 mg by mouth daily.   Not Taking at Unknown time  . nitroGLYCERIN (NITROSTAT) 0.4 MG SL tablet Place 0.4 mg under the tongue every 5 (five) minutes as needed for chest pain.    Taking  . traZODone (DESYREL) 50 MG tablet Take 50 mg by mouth at bedtime as needed for sleep.   Not Taking at Unknown time     Allergies  Allergen Reactions  . Diphenhydramine Hcl Other (See Comments)    "The more I take, it makes my prostate swell..."  . Ezetimibe Other (See  Comments)    Unknown  . Lopressor [Metoprolol] Itching  . Metformin Other (See Comments)    Unknown  . Other Itching and Other (See Comments)    FA Dye Prostate swelling  . Sulfa Antibiotics Other (See Comments)    Unknown     Past Medical History:  Diagnosis Date  . Anemia   . Anxiety    Generalized anxiety disorder  . Asthmatic bronchitis   . Atopic dermatitis   . BPH (benign prostatic hyperplasia)   . Cardiovascular disease   . Carotid disease, bilateral ([place])   . Colon polyp   . Constipation   . Coronary artery disease   . Diabetes mellitus without complication ([place])   . Diabetic retinopathy ([place])   . GERD (gastroesophageal reflux disease)   . Glaucoma of left eye   . Gout   . History of GI bleed   . Hypercholesterolemia   . Hypertension   . Hypomagnesemia   . Hypothyroidism   . Insomnia   . Mixed hyperlipidemia   . Myocardial infarction ([place])   . Renal disorder   . Renal insufficiency   . Sleep apnea    Obstructive sleep apnea  . Stroke ([place])   . Thyroid disease   . Vitamin D deficiency     Review of systems:   Negative.    Physical Exam  Gen: Alert, oriented. Appears stated age.  HEENT: [place]/AT. PERRLA. Lungs: CTA, no wheezes. CV: RR nl S1, S2. Abd: soft, benign, no masses. BS+ Ext: No edema. Pulses 2+    Planned procedures: Colonoscopy. The patient understands the nature of the planned procedure, indications, risks, alternatives and potential complications including but not limited to bleeding, infection, perforation, damage to internal organs and possible oversedation/side effects from anesthesia. The patient agrees and gives consent to proceed.  Please refer to procedure notes for findings, recommendations and patient disposition/instructions.    Teodoro K. Alice Reichert, M.D. Gastroenterology 02/24/2018  8:28 AM

## 2018-02-25 ENCOUNTER — Encounter: Payer: Self-pay | Admitting: Internal Medicine

## 2018-03-02 LAB — SURGICAL PATHOLOGY

## 2018-03-03 ENCOUNTER — Encounter: Payer: Self-pay | Admitting: Nurse Practitioner

## 2018-03-04 ENCOUNTER — Other Ambulatory Visit: Payer: Self-pay

## 2018-03-04 ENCOUNTER — Encounter: Payer: Self-pay | Admitting: Hematology and Oncology

## 2018-03-04 ENCOUNTER — Inpatient Hospital Stay: Payer: Medicare Other | Attending: Hematology and Oncology | Admitting: Hematology and Oncology

## 2018-03-04 VITALS — BP 160/78 | HR 61 | Temp 96.9°F | Resp 18 | Ht 71.0 in | Wt 252.6 lb

## 2018-03-04 DIAGNOSIS — H409 Unspecified glaucoma: Secondary | ICD-10-CM | POA: Insufficient documentation

## 2018-03-04 DIAGNOSIS — E1122 Type 2 diabetes mellitus with diabetic chronic kidney disease: Secondary | ICD-10-CM | POA: Insufficient documentation

## 2018-03-04 DIAGNOSIS — K625 Hemorrhage of anus and rectum: Secondary | ICD-10-CM | POA: Diagnosis not present

## 2018-03-04 DIAGNOSIS — E782 Mixed hyperlipidemia: Secondary | ICD-10-CM | POA: Insufficient documentation

## 2018-03-04 DIAGNOSIS — I129 Hypertensive chronic kidney disease with stage 1 through stage 4 chronic kidney disease, or unspecified chronic kidney disease: Secondary | ICD-10-CM | POA: Diagnosis not present

## 2018-03-04 DIAGNOSIS — Z7982 Long term (current) use of aspirin: Secondary | ICD-10-CM

## 2018-03-04 DIAGNOSIS — R51 Headache: Secondary | ICD-10-CM | POA: Diagnosis not present

## 2018-03-04 DIAGNOSIS — Z8673 Personal history of transient ischemic attack (TIA), and cerebral infarction without residual deficits: Secondary | ICD-10-CM | POA: Insufficient documentation

## 2018-03-04 DIAGNOSIS — Z86010 Personal history of colonic polyps: Secondary | ICD-10-CM | POA: Insufficient documentation

## 2018-03-04 DIAGNOSIS — I2510 Atherosclerotic heart disease of native coronary artery without angina pectoris: Secondary | ICD-10-CM | POA: Insufficient documentation

## 2018-03-04 DIAGNOSIS — Z803 Family history of malignant neoplasm of breast: Secondary | ICD-10-CM | POA: Diagnosis not present

## 2018-03-04 DIAGNOSIS — Z87891 Personal history of nicotine dependence: Secondary | ICD-10-CM | POA: Diagnosis not present

## 2018-03-04 DIAGNOSIS — G4733 Obstructive sleep apnea (adult) (pediatric): Secondary | ICD-10-CM | POA: Diagnosis not present

## 2018-03-04 DIAGNOSIS — N400 Enlarged prostate without lower urinary tract symptoms: Secondary | ICD-10-CM | POA: Diagnosis not present

## 2018-03-04 DIAGNOSIS — K219 Gastro-esophageal reflux disease without esophagitis: Secondary | ICD-10-CM | POA: Insufficient documentation

## 2018-03-04 DIAGNOSIS — C7A8 Other malignant neuroendocrine tumors: Secondary | ICD-10-CM | POA: Diagnosis present

## 2018-03-04 DIAGNOSIS — L209 Atopic dermatitis, unspecified: Secondary | ICD-10-CM | POA: Insufficient documentation

## 2018-03-04 DIAGNOSIS — N183 Chronic kidney disease, stage 3 (moderate): Secondary | ICD-10-CM | POA: Insufficient documentation

## 2018-03-04 DIAGNOSIS — Z7902 Long term (current) use of antithrombotics/antiplatelets: Secondary | ICD-10-CM | POA: Diagnosis not present

## 2018-03-04 DIAGNOSIS — Z794 Long term (current) use of insulin: Secondary | ICD-10-CM | POA: Diagnosis not present

## 2018-03-04 DIAGNOSIS — Z79899 Other long term (current) drug therapy: Secondary | ICD-10-CM | POA: Insufficient documentation

## 2018-03-04 DIAGNOSIS — E039 Hypothyroidism, unspecified: Secondary | ICD-10-CM | POA: Insufficient documentation

## 2018-03-04 DIAGNOSIS — K5900 Constipation, unspecified: Secondary | ICD-10-CM | POA: Insufficient documentation

## 2018-03-04 DIAGNOSIS — I252 Old myocardial infarction: Secondary | ICD-10-CM

## 2018-03-04 NOTE — Progress Notes (Signed)
Patient here for evaluation.

## 2018-03-04 NOTE — Progress Notes (Signed)
[facility] day:  03/04/2018  Chief Complaint: Roberto Ingram is a 67 y.o. 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  HPI:  The patient presented with rectal bleeding in late 12/2017 - mid 01/2018.  He had been on Plavix and baby aspirin following a CVA on 01/22/2017. He noted occasional constipation requiring Colace.   Colonoscopy for rectal bleeding in 05/2014 was negative.  He underwent colonoscopy on 02/24/2018 by Dr. Olean Ree.  There was diverticulosis in the sigmoid colon.  There was no evidence of diverticular bleeding.  There was one 3 mm polyp at the ileocecal valve.  There were four 2-4 mm polyps in the distal sigmoid colon and in the descending colon.  There were non-bleeding internal hemorrhoids.  Pathology revealed no pathologic change in the ileocecal valve polyp.  There was a tubular adenoma in the descending colon without atypia or dysplasia.  There was a prominent lymphoid aggregate in the descending colon. A polyp in the sigmoid colon revealed a 2.1 mm well differentiated (grade I) neuroendocrine tumor.  Tumor was positive for synaptophysin and chromogranin and was present in 3 of 4 pieces.  Ki-67 was < 3%.  Mitosis was < 2/2 sq mm.  Tumor seen at the deep biopsy edge.  He has been referred to [facility] for endoscopic ultrasound.  Labs on 02/25/2018 revealed a hematocrit of 37.6, hemoglobin 12.6, MCV 85, platelets 242,000, WBC 7600 with an ANC of 4200.  Creatinine was 1.68.  Albumen was 4.3.  Has known OSAH syndrome. He uses nocturnal PAP therapy. He has suffered with chronic headaches since he was a child. Patient denies B symptoms. He denies nausea, vomiting, or significant weight loss. Patient has intermittent constipation. Patient denies bleeding; no hematochezia, melena, or gross hematuria. Patient has BPH for  which he has Flomax. Patient is followed by nephrology for CKD-III. Wife notes that patient has been more off balance as of late.   Patient's brother has a "cancerous polyp" in his early 53s. Two other brothers had lung cancer (late 9s) and prostate cancer (age unknown).  Mother had breast cancer (early 30s).   Past Medical History:  Diagnosis Date  . Anemia   . Anxiety    Generalized anxiety disorder  . Asthmatic bronchitis   . Atopic dermatitis   . BPH (benign prostatic hyperplasia)   . Cardiovascular disease   . Carotid disease, bilateral ([place])   . Colon polyp   . Constipation   . Coronary artery disease   . Diabetes mellitus without complication ([place])   . Diabetic retinopathy ([place])   . GERD (gastroesophageal reflux disease)   . Glaucoma of left eye   . Gout   . History of GI bleed   . Hypercholesterolemia   . Hypertension   . Hypomagnesemia   . Hypothyroidism   . Insomnia   . Mixed hyperlipidemia   . Myocardial infarction ([place])   . Renal disorder   . Renal insufficiency   . Sleep apnea    Obstructive sleep apnea  . Stroke ([place])   . Thyroid disease   . Vitamin D deficiency     Past Surgical History:  Procedure Laterality Date  . COLONOSCOPY    . COLONOSCOPY WITH PROPOFOL N/A 02/24/2018   Procedure: COLONOSCOPY WITH PROPOFOL;  Surgeon: Toledo, Benay Pike, MD;  Location: [facility] ENDOSCOPY;  Service: Gastroenterology;  Laterality:  N/A;  . CORONARY ANGIOPLASTY    . CORONARY ARTERY BYPASS GRAFT    . ESOPHAGOGASTRODUODENOSCOPY    . EXPLORATION POST OPERATIVE OPEN HEART    . EYE SURGERY     Lens eye surgery  . NASAL POLYP EXCISION  1994  . POLYPECTOMY    . Quadruple bypass      Family History  Problem Relation Age of Onset  . Atrial fibrillation Mother   . Heart disease Mother   . Stroke Mother   . Bell's palsy Mother   . Kidney disease Mother   . Cancer Mother   . Diabetes Father   . Arthritis Father   . Stroke Brother   . Diabetes Brother   . Lung cancer  Brother   . Heart attack Brother   . Cancer Brother   . Heart disease Sister   . COPD Sister   . Throat cancer Sister     Social History:  reports that he quit smoking about 24 years ago. His smoking use included cigarettes. He has never used smokeless tobacco. He reports that he does not drink alcohol or use drugs.  Patient is a former 1 ppd smoker for 22 years. He quit smoking after his MI in 1994. Patient is a a retired Artist. Patient denies known exposures to radiation on toxins. The patient is accompanied by his "last wife", Roberto Ingram, today.  Allergies:  Allergies  Allergen Reactions  . Diphenhydramine Hcl Other (See Comments)    "The more I take, it makes my prostate swell..."  . Ezetimibe Other (See Comments)    Unknown  . Lopressor [Metoprolol] Itching  . Metformin Other (See Comments)    Unknown  . Other Itching and Other (See Comments)    FA Dye Prostate swelling  . Sulfa Antibiotics Other (See Comments)    Unknown    Current Medications: Current Outpatient Medications  Medication Sig Dispense Refill  . albuterol (PROVENTIL HFA;VENTOLIN HFA) 108 (90 Base) MCG/ACT inhaler Inhale 2 puffs into the lungs every 6 (six) hours as needed for wheezing or shortness of breath.     . ALLOPURINOL PO Take by mouth daily.    Marland Kitchen ALPRAZolam (XANAX) 0.5 MG tablet Take 0.5 mg by mouth at bedtime as needed for sleep.     Marland Kitchen amLODipine (NORVASC) 10 MG tablet Take 10 mg by mouth daily.    Marland Kitchen aspirin 81 MG chewable tablet Chew by mouth.    . budesonide (PULMICORT) 180 MCG/ACT inhaler Inhale 1 puff into the lungs 2 (two) times daily as needed (for shortness of breath).     . clopidogrel (PLAVIX) 75 MG tablet Take 75 mg by mouth daily.    . fluticasone (FLONASE) 50 MCG/ACT nasal spray Place 2 sprays into both nostrils daily as needed for allergies or rhinitis.    Marland Kitchen insulin glargine (LANTUS) 100 UNIT/ML injection Inject 66 Units into the skin daily.     . insulin regular (NOVOLIN  R,HUMULIN R) 100 units/mL injection Inject 12 to 26 units subcutaneously three times a day before each meal per sliding scale    . levothyroxine (SYNTHROID) 50 MCG tablet Take 50 mcg by mouth daily before breakfast.     . lisinopril (PRINIVIL,ZESTRIL) 40 MG tablet Take 40 mg by mouth daily.     . metoprolol tartrate (LOPRESSOR) 25 MG tablet Take 25 mg by mouth 2 (two) times daily.     . nitroGLYCERIN (NITROSTAT) 0.4 MG SL tablet Place 0.4 mg under the tongue every  5 (five) minutes as needed for chest pain.     . Omega-3 Fatty Acids (FISH OIL) 1000 MG CAPS Take 1,000 mg by mouth daily.     . rosuvastatin (CRESTOR) 40 MG tablet Take 40 mg by mouth daily.     Marland Kitchen Specialty Vitamins Products (MAGNESIUM, AMINO ACID CHELATE,) 133 MG tablet Take 2 tablets by mouth 2 (two) times daily.    . tamsulosin (FLOMAX) 0.4 MG CAPS capsule Take 0.4 mg by mouth daily after supper.     . Vitamin D, Ergocalciferol, (DRISDOL) 50000 units CAPS capsule Take 1 tablet by mouth twice each week    . cetirizine (ZYRTEC) 10 MG tablet Take 10 mg by mouth daily.    Marland Kitchen docusate sodium (COLACE) 100 MG capsule Take 100 mg by mouth 2 (two) times daily.    Marland Kitchen gabapentin (NEURONTIN) 300 MG capsule Take 300 mg by mouth daily.     No current facility-administered medications for this visit.     Review of Systems:  GENERAL:  Feels good. No fevers, sweats or weight loss. PERFORMANCE STATUS (ECOG):  1. HEENT:  No visual changes, runny nose, sore throat, mouth sores or tenderness. Lungs: No shortness of breath.  Cough now and then.  No hemoptysis.  Sleep apnea. Cardiac:  No chest pain, palpitations, orthopnea, or PND. GI:  Constipation.  No nausea, vomiting, diarrhea, melena or hematochezia. GU:  No urgency, frequency, or dysuria.  Hematuria 1-2 times. On Flomax. Musculoskeletal:  No back pain.  No joint pain.  No muscle tenderness. Extremities:  Ankle swelling from time to time.  No pain. Skin:  Atopic dermatitis.. Neuro:  Chronic  headaches.  No numbness or weakness, balance or coordination issues.  CVA 01/22/2017. Endocrine:  No diabetes, thyroid issues, hot flashes or night sweats. Psych:  No mood changes, depression or anxiety. Pain:  No focal pain. Review of systems:  All other systems reviewed and found to be negative.  Physical Exam: Blood pressure (!) 160/78, pulse 61, temperature (!) 96.9 F (36.1 C), temperature source Tympanic, resp. rate 18, height _0  (1.803 m), weight 252 lb 9.6 oz (114.6 kg). GENERAL:  Well developed, well nourished, gentleman sitting comfortably in the exam room in no acute distress. MENTAL STATUS:  Alert and oriented to person, place and time. HEAD:  Curly graying hair.  Mustache.  Normocephalic, atraumatic, face symmetric, no Cushingoid features. EYES:  Brown eyes.  Pupils equal round and reactive to light and accomodation.  No conjunctivitis or scleral icterus. ENT:  Oropharynx clear without lesion.  Tongue normal. Mucous membranes moist.  RESPIRATORY:  Clear to auscultation without rales, wheezes or rhonchi. CARDIOVASCULAR:  Regular rate and rhythm without murmur, rub or gallop. ABDOMEN:  Soft, non-tender, with active bowel sounds, and no hepatosplenomegaly.  No masses. SKIN:  No rashes, ulcers or lesions. EXTREMITIES: No edema, no skin discoloration or tenderness.  No palpable cords. LYMPH NODES: No palpable cervical, supraclavicular, axillary or inguinal adenopathy  NEUROLOGICAL: Unremarkable. PSYCH:  Appropriate.   No visits with results within 3 Day(s) from this visit.  Latest known visit with results is:  Admission on 02/24/2018, Discharged on 02/24/2018  Component Date Value Ref Range Status  . Glucose-Capillary 02/24/2018 77  65 - 99 mg/dL Final  . SURGICAL PATHOLOGY 02/24/2018    Final                   Value:Surgical Pathology CASE: [id number] PATIENT: [facility] Pathology Report     SPECIMEN SUBMITTED: A.  Colon polyp, ileocecal valve; cbx B.  Colon polyp x2, descending; cbx C. Colon polyp x2, sigmoid; cbx  CLINICAL HISTORY: None provided  PRE-OPERATIVE DIAGNOSIS: Rectal bleeding  POST-OPERATIVE DIAGNOSIS: Colon polyps, diverticulosis, internal hemorrhoids     DIAGNOSIS: A.  COLON POLYP, ILEOCECAL VALVE; COLD BIOPSY: - NO PATHOLOGIC CHANGE. - MULTIPLE DEEPER LEVELS WERE EXAMINED.  B.  COLON POLYP X2, DESCENDING; COLD BIOPSY: -TUBULAR ADENOMA (ONE FRAGMENT), NEGATIVE FOR HIGH-GRADE DYSPLASIA AND MALIGNANCY. - PROMINENT LYMPHOID AGGREGATE (ONE FRAGMENT). - MULTIPLE DEEPER LEVELS WERE EXAMINED.  C.  COLON POLYP X2, SIGMOID; COLD BIOPSY: - WELL-DIFFERENTIATED NEUROENDOCRINE TUMOR, GRADE 1, 2.1 MM. - TUMOR IS SEEN AT THE DEEP BIOPSY EDGE.  Note: Tumor is positive for synaptophysin and chromogranin and is present in 3 of 4 pieces.  Ki-67 labe                         ling index is less than 3%. Mitoses <2/2 sq. mm. The control slides stained appropriately.   GROSS DESCRIPTION: A. Labeled: Ileocecal valve polyp cbx Received: In formalin Tissue fragment(s): 3 Size: 0.1-0.5 cm Description: Tan fragments Entirely submitted in one cassette.  B. Labeled: Descending colon polyp cbx x2 Received: In formalin Tissue fragment(s): 2 Size: 0.5 - 0.7 cm Description: Tan fragments Entirely submitted in one cassette.  C. Labeled: Sigmoid colon polyp cbx x2 Received: in formalin Tissue fragment(s): 4 Size: 0.2-0.4 cm Description: Tan fragments Entirely submitted in one cassette.    Final Diagnosis performed by Delorse Lek, MD.   Electronically signed 03/02/2018 10:42:57AM The electronic signature indicates that the named Attending Pathologist has evaluated the specimen  Technical component performed at [facility], [address] [place], [place] [address] Lab: [id number] Dir: Rush Farmer, MD, MMM  Professional component performed at [place], [facility], [place], [place], [place] [address] Lab: [phone] Dir: Dellia Nims. Reuel Derby, MD   . Glucose-Capillary 02/24/2018 158* 65 - 99 mg/dL Final    Assessment:  LAVONTAE CORNIA is a 67 y.o. 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  He presented with rectal bleeding.  Colonoscopy on 02/24/2018 revealed one 3 mm polyp at the ileocecal valve and four 2-4 mm polyps in the distal sigmoid colon and in the descending colon.  Pathology revealed a tubular adenoma in the descending colon without atypia or dysplasia.  There was a prominent lymphoid aggregate in the descending colon.  A polyp in the sigmoid colon revealed a 2.1 mm well differentiated (grade I) neuroendocrine tumor.  Tumor was positive for synaptophysin and chromogranin and was present in 3 of 4 pieces.  Ki-67 was < 3%.  Mitosis was < 2/2 sq mm.  Tumor was seen at the deep biopsy edge.  Symptomatically, he feels good.  Exam is unremarkable.  Plan: 1.  Discuss grade I neuroendocrine tumors of the GI tract.  Patient asymptomatic (no flushing or diarrhea) with a tiny tumor.  Although tumor in distal sigmoid colon, patient has been referred for endoscopic ultrasound to assess depth of penetration.  For small low grade rectal lesions (< 1 cm) incompletely resected, repeat endoscopy in 6 months is recommended.  If lesion is T1 (invades the lamina propria or submucosa) and re-visualized, anticipate re-excision.  If lesion is T2-T4, (invades muscularis mucosa or deeper) anticipate  abdomen/pelvic CT or MRI followed by resection. 2.  Patient has been referred to [facility] for an EUS. Contact Mariea Clonts, nurse navigator, to assist with referral. 3.  RTC after EUS at [facility] for MD assessment and discussion regarding direction of therapy.   Honor Loh, NP  03/04/2018, 3:55 PM   I saw and evaluated the patient, participating in the key portions of the service and reviewing pertinent diagnostic studies and records.   I reviewed the nurse practitioner's note and agree with the findings and the plan.  The assessment and plan were discussed with the patient.  Multiple questions were asked by the patient and answered.   Nolon Stalls, MD 03/04/2018,3:55 PM

## 2018-03-06 ENCOUNTER — Telehealth: Payer: Self-pay

## 2018-03-06 NOTE — Telephone Encounter (Signed)
Spoke with [facility]. They have pulled EUS referral and will arrange with Mr. Pipkins. Oncology Nurse Navigator Documentation  Navigator Location: [facility] (03/06/18 0900)   )Navigator Encounter Type: Telephone (03/06/18 0900)                         Barriers/Navigation Needs: Coordination of Care (03/06/18 0900)   Interventions: Coordination of Care (03/06/18 0900)   Coordination of Care: EUS (03/06/18 0900)                  Time Spent with Patient: 15 (03/06/18 0900)

## 2018-03-06 NOTE — Telephone Encounter (Signed)
[facility] EUS have been arranged with Dr. Bobby Rumpf on Tuesday, 5/7.  Oncology Nurse Navigator Documentation  Navigator Location: [facility] (03/06/18 1000)   )Navigator Encounter Type: Other (03/06/18 1000)                                 Coordination of Care: EUS (03/06/18 1000)                  Time Spent with Patient: 15 (03/06/18 1000)

## 2018-03-10 ENCOUNTER — Telehealth: Payer: Self-pay | Admitting: Nurse Practitioner

## 2018-03-10 NOTE — Telephone Encounter (Signed)
error 

## 2018-03-23 ENCOUNTER — Telehealth: Payer: Self-pay

## 2018-03-24 NOTE — Telephone Encounter (Signed)
EUS performed 5/10. Report pulled and given to Dr. Mike Gip for any follow up. Pathology still pending. Oncology Nurse Navigator Documentation  Navigator Location: [facility] (03/24/18 0900)   )Navigator Encounter Type: Diagnostic Results (03/24/18 0900)                                 Coordination of Care: EUS (03/24/18 0900)                  Time Spent with Patient: 15 (03/24/18 0900)

## 2018-03-26 ENCOUNTER — Encounter: Payer: Self-pay | Admitting: Nephrology

## 2018-04-09 ENCOUNTER — Emergency Department (HOSPITAL_COMMUNITY): Payer: Medicare Other

## 2018-04-09 ENCOUNTER — Other Ambulatory Visit: Payer: Self-pay

## 2018-04-09 ENCOUNTER — Encounter (HOSPITAL_COMMUNITY): Payer: Self-pay | Admitting: Emergency Medicine

## 2018-04-09 ENCOUNTER — Emergency Department (HOSPITAL_COMMUNITY)
Admission: EM | Admit: 2018-04-09 | Discharge: 2018-04-09 | Disposition: A | Discharge status: Home / Self Care | Payer: Medicare Other | Attending: Emergency Medicine | Admitting: Emergency Medicine

## 2018-04-09 DIAGNOSIS — Z7902 Long term (current) use of antithrombotics/antiplatelets: Secondary | ICD-10-CM | POA: Diagnosis not present

## 2018-04-09 DIAGNOSIS — Z7982 Long term (current) use of aspirin: Secondary | ICD-10-CM | POA: Insufficient documentation

## 2018-04-09 DIAGNOSIS — E10319 Type 1 diabetes mellitus with unspecified diabetic retinopathy without macular edema: Secondary | ICD-10-CM | POA: Diagnosis not present

## 2018-04-09 DIAGNOSIS — E039 Hypothyroidism, unspecified: Secondary | ICD-10-CM | POA: Insufficient documentation

## 2018-04-09 DIAGNOSIS — Z8673 Personal history of transient ischemic attack (TIA), and cerebral infarction without residual deficits: Secondary | ICD-10-CM | POA: Insufficient documentation

## 2018-04-09 DIAGNOSIS — E7800 Pure hypercholesterolemia, unspecified: Secondary | ICD-10-CM | POA: Diagnosis not present

## 2018-04-09 DIAGNOSIS — Z87891 Personal history of nicotine dependence: Secondary | ICD-10-CM | POA: Diagnosis not present

## 2018-04-09 DIAGNOSIS — R4781 Slurred speech: Secondary | ICD-10-CM | POA: Insufficient documentation

## 2018-04-09 DIAGNOSIS — I10 Essential (primary) hypertension: Secondary | ICD-10-CM | POA: Insufficient documentation

## 2018-04-09 DIAGNOSIS — R42 Dizziness and giddiness: Secondary | ICD-10-CM | POA: Diagnosis present

## 2018-04-09 DIAGNOSIS — E782 Mixed hyperlipidemia: Secondary | ICD-10-CM | POA: Diagnosis not present

## 2018-04-09 DIAGNOSIS — I252 Old myocardial infarction: Secondary | ICD-10-CM | POA: Insufficient documentation

## 2018-04-09 DIAGNOSIS — Z794 Long term (current) use of insulin: Secondary | ICD-10-CM | POA: Insufficient documentation

## 2018-04-09 DIAGNOSIS — I2510 Atherosclerotic heart disease of native coronary artery without angina pectoris: Secondary | ICD-10-CM | POA: Diagnosis not present

## 2018-04-09 DIAGNOSIS — Z79899 Other long term (current) drug therapy: Secondary | ICD-10-CM | POA: Diagnosis not present

## 2018-04-09 LAB — URINALYSIS, ROUTINE W REFLEX MICROSCOPIC
BACTERIA UA: NONE SEEN
BILIRUBIN URINE: NEGATIVE
Glucose, UA: NEGATIVE mg/dL
KETONES UR: NEGATIVE mg/dL
LEUKOCYTES UA: NEGATIVE
Nitrite: NEGATIVE
PROTEIN: 100 mg/dL — AB
Specific Gravity, Urine: 1.015 (ref 1.005–1.030)
pH: 5 (ref 5.0–8.0)

## 2018-04-09 LAB — CBC
HEMATOCRIT: 39.5 % (ref 39.0–52.0)
HEMOGLOBIN: 12.4 g/dL — AB (ref 13.0–17.0)
MCH: 28.1 pg (ref 26.0–34.0)
MCHC: 31.4 g/dL (ref 30.0–36.0)
MCV: 89.4 fL (ref 78.0–100.0)
Platelets: 243 10*3/uL (ref 150–400)
RBC: 4.42 MIL/uL (ref 4.22–5.81)
RDW: 13.4 % (ref 11.5–15.5)
WBC: 8.2 10*3/uL (ref 4.0–10.5)

## 2018-04-09 LAB — BASIC METABOLIC PANEL
ANION GAP: 7 (ref 5–15)
BUN: 30 mg/dL — ABNORMAL HIGH (ref 6–20)
CALCIUM: 9.1 mg/dL (ref 8.9–10.3)
CO2: 25 mmol/L (ref 22–32)
Chloride: 108 mmol/L (ref 101–111)
Creatinine, Ser: 2.07 mg/dL — ABNORMAL HIGH (ref 0.61–1.24)
GFR, EST AFRICAN AMERICAN: 36 mL/min — AB (ref >60)
GFR, EST NON AFRICAN AMERICAN: 31 mL/min — AB (ref >60)
Glucose, Bld: 143 mg/dL — ABNORMAL HIGH (ref 65–99)
POTASSIUM: 4.2 mmol/L (ref 3.5–5.1)
Sodium: 140 mmol/L (ref 135–145)

## 2018-04-09 LAB — CBG MONITORING, ED: GLUCOSE-CAPILLARY: 56 mg/dL — AB (ref 65–99)

## 2018-04-09 MED ORDER — MECLIZINE HCL 12.5 MG PO TABS: 12.5000 mg | ORAL_TABLET | Freq: Three times a day (TID) | ORAL | 0 refills | Status: DC | PRN

## 2018-04-09 MED ORDER — LORAZEPAM 2 MG/ML IJ SOLN
1.0000 mg | Freq: Once | INTRAMUSCULAR | Status: AC
  Administered 2018-04-09: 1 mg via INTRAVENOUS
  Filled 2018-04-09: qty 1

## 2018-04-09 MED ORDER — SODIUM CHLORIDE 0.9 % IV BOLUS
500.0000 mL | Freq: Once | INTRAVENOUS | Status: AC
  Administered 2018-04-09: 500 mL via INTRAVENOUS

## 2018-04-09 NOTE — ED Notes (Signed)
Pt O2 sats 93% prior to ativan admin. Placed on 2 L [place] as precaution prior to pt transporting to MRI. Pt transported to MRI at this time.

## 2018-04-09 NOTE — Consult Note (Addendum)
Requesting Physician: Dr. Sherry Ruffing    Chief Complaint: Vertigo  History obtained from:  Patient     HPI:                                                                                                                                         Roberto Ingram is an 67 y.o. male with stroke risk factors of hypercholesterolemia, hypertension, diabetes and carotid disease bilaterally.  Patient states that he was outside yesterday approximately around 2:00 and was chasing around a black snake trying to kill it with a hoe.  After he was out there for a while he mowed his lawn on a riding lawnmower.  He states that he did not drink much water during the day.  Initially when he got up from sitting down he felt dizzy but later around 1700 hrs. he stood up and felt as if the room was spinning severely from the right to the left.  If he did not move the spinning stopped.  This continued to occur both in a lying position and standing position even until this morning.  Comes only lasted for approximately 2 to 5 minutes and then stopped especially if he did not move.  Currently he is having very minimal symptoms.  It was also noted that he has a left facial droop and unable to.  His left eyelid.  I had asked the family members of the left facial droop appears normal.  They stated he often does talk with one side of the mouth but were unsure.  Date last known well: Date: 04/08/2018 Time last known well: Time: 17:00 tPA Given: No: Out of window and symptoms resolving to almost back to normal NIH SS scale 1  Modified Rankin: Rankin Score=0   Past Medical History:  Diagnosis Date  . Anemia   . Anxiety    Generalized anxiety disorder  . Asthmatic bronchitis   . Atopic dermatitis   . BPH (benign prostatic hyperplasia)   . Cardiovascular disease   . Carotid disease, bilateral ([place])   . Colon polyp   . Constipation   . Coronary artery disease   . Diabetes mellitus without complication ([place])   . Diabetic  retinopathy ([place])   . GERD (gastroesophageal reflux disease)   . Glaucoma of left eye   . Gout   . History of GI bleed   . Hypercholesterolemia   . Hypertension   . Hypomagnesemia   . Hypothyroidism   . Insomnia   . Mixed hyperlipidemia   . Myocardial infarction ([place])   . Renal disorder   . Renal insufficiency   . Sleep apnea    Obstructive sleep apnea  . Stroke ([place])   . Thyroid disease   . Vitamin D deficiency     Past Surgical History:  Procedure Laterality Date  . COLONOSCOPY    . COLONOSCOPY WITH PROPOFOL N/A 02/24/2018  Procedure: COLONOSCOPY WITH PROPOFOL;  Surgeon: Toledo, Benay Pike, MD;  Location: [facility] ENDOSCOPY;  Service: Gastroenterology;  Laterality: N/A;  . CORONARY ANGIOPLASTY    . CORONARY ARTERY BYPASS GRAFT    . ESOPHAGOGASTRODUODENOSCOPY    . EXPLORATION POST OPERATIVE OPEN HEART    . EYE SURGERY     Lens eye surgery  . NASAL POLYP EXCISION  1994  . POLYPECTOMY    . Quadruple bypass      Family History  Problem Relation Age of Onset  . Atrial fibrillation Mother   . Heart disease Mother   . Stroke Mother   . Bell's palsy Mother   . Kidney disease Mother   . Cancer Mother   . Diabetes Father   . Arthritis Father   . Stroke Brother   . Diabetes Brother   . Lung cancer Brother   . Heart attack Brother   . Cancer Brother   . Heart disease Sister   . COPD Sister   . Throat cancer Sister        Social History:  reports that he quit smoking about 24 years ago. His smoking use included cigarettes. He has never used smokeless tobacco. He reports that he does not drink alcohol or use drugs.  Allergies:  Allergies  Allergen Reactions  . Diphenhydramine Hcl Other (See Comments)    "The more I take, it makes my prostate swell..."  . Ezetimibe Other (See Comments)    Unknown  . Lopressor [Metoprolol] Itching  . Metformin Other (See Comments)    Unknown  . Other Itching and Other (See Comments)    FA Dye Prostate swelling  . Sulfa Antibiotics  Other (See Comments)    Unknown    Medications:                                                                                                                           Current Facility-Administered Medications  Medication Dose Route Frequency Provider Last Rate Last Dose  . LORazepam (ATIVAN) injection 1 mg  1 mg Intravenous Once Tegeler, Gwenyth Allegra, MD   Stopped at 04/09/18 1315   Current Outpatient Medications  Medication Sig Dispense Refill  . albuterol (PROVENTIL HFA;VENTOLIN HFA) 108 (90 Base) MCG/ACT inhaler Inhale 2 puffs into the lungs every 6 (six) hours as needed for wheezing or shortness of breath.     . ALLOPURINOL PO Take by mouth daily.    Marland Kitchen ALPRAZolam (XANAX) 0.5 MG tablet Take 0.5 mg by mouth at bedtime as needed for sleep.     Marland Kitchen amLODipine (NORVASC) 10 MG tablet Take 10 mg by mouth daily.    Marland Kitchen aspirin 81 MG chewable tablet Chew by mouth.    . budesonide (PULMICORT) 180 MCG/ACT inhaler Inhale 1 puff into the lungs 2 (two) times daily as needed (for shortness of breath).     . cetirizine (ZYRTEC) 10 MG tablet Take 10 mg by mouth daily.    . clopidogrel (PLAVIX)  75 MG tablet Take 75 mg by mouth daily.    Marland Kitchen docusate sodium (COLACE) 100 MG capsule Take 100 mg by mouth 2 (two) times daily.    . fluticasone (FLONASE) 50 MCG/ACT nasal spray Place 2 sprays into both nostrils daily as needed for allergies or rhinitis.    Marland Kitchen insulin glargine (LANTUS) 100 UNIT/ML injection Inject 66 Units into the skin daily.     . insulin regular (NOVOLIN R,HUMULIN R) 100 units/mL injection Inject 12 to 26 units subcutaneously three times a day before each meal per sliding scale    . levothyroxine (SYNTHROID) 50 MCG tablet Take 50 mcg by mouth daily before breakfast.     . lisinopril (PRINIVIL,ZESTRIL) 40 MG tablet Take 40 mg by mouth daily.     . metoprolol tartrate (LOPRESSOR) 25 MG tablet Take 25 mg by mouth 2 (two) times daily.     . nitroGLYCERIN (NITROSTAT) 0.4 MG SL tablet Place 0.4 mg  under the tongue every 5 (five) minutes as needed for chest pain.     . Omega-3 Fatty Acids (FISH OIL) 1000 MG CAPS Take 1,000 mg by mouth daily.     . rosuvastatin (CRESTOR) 40 MG tablet Take 40 mg by mouth daily.     Marland Kitchen Specialty Vitamins Products (MAGNESIUM, AMINO ACID CHELATE,) 133 MG tablet Take 2 tablets by mouth 2 (two) times daily.    . tamsulosin (FLOMAX) 0.4 MG CAPS capsule Take 0.4 mg by mouth daily after supper.     . Vitamin D, Ergocalciferol, (DRISDOL) 50000 units CAPS capsule Take 1 tablet by mouth twice each week       ROS:                                                                                                                                       History obtained from the patient  General ROS: negative for - chills, fatigue, fever, night sweats, weight gain or weight loss Psychological ROS: negative for - , hallucinations, memory difficulties, mood swings or  Ophthalmic ROS: negative for - blurry vision, double vision, eye pain or loss of vision ENT ROS: negative for - epistaxis, nasal discharge, oral lesions, sore throat, tinnitus or vertigo Respiratory ROS: negative for - cough,  shortness of breath or wheezing Cardiovascular ROS: negative for - chest pain, dyspnea on exertion,  Gastrointestinal ROS: negative for - abdominal pain, diarrhea,  nausea/vomiting or stool incontinence Genito-Urinary ROS: negative for - dysuria, hematuria, incontinence or urinary frequency/urgency Musculoskeletal ROS: negative for - joint swelling or muscular weakness Neurological ROS: as noted in HPI   General Examination:  Blood pressure (!) 159/73, pulse (!) 52, temperature 97.6 F (36.4 C), temperature source Oral, resp. rate 18, height 5\' 11"  (1.803 m), weight 115.2 kg (254 lb), SpO2 96 %.  HEENT-  Normocephalic, no lesions, without obvious abnormality.  Normal external eye and  conjunctiva.   Cardiovascular- S1-S2 audible, pulses palpable throughout   Lungs-no rhonchi or wheezing noted, no excessive working breathing.  Saturations within normal limits Abdomen- All 4 quadrants palpated and nontender Extremities- Warm, dry and intact Musculoskeletal-no joint tenderness, deformity or swelling Skin-warm and dry, no hyperpigmentation, vitiligo, or suspicious lesions  Neurological Examination Mental Status: Alert, oriented, thought content appropriate.  Speech fluent without evidence of aphasia.  Able to follow 3 step commands without difficulty. Cranial Nerves: II:  Visual fields grossly normal,  III,IV, VI: ptosis not present, cannot bury his left eyelid compared to his right.  Extra-ocular motions intact bilaterally, pupils equal, round, reactive to light and accommodation V,VII: At rest has a left nasolabial fold decrease, facial light touch sensation normal bilaterally VIII: hearing normal bilaterally IX,X: uvula rises symmetrically XI: bilateral shoulder shrug XII: midline tongue extension Motor: Right : Upper extremity   5/5    Left:     Upper extremity   5/5  Lower extremity   5/5     Lower extremity   5/5 Tone and bulk:normal tone throughout; no atrophy noted Sensory: Pinprick and light touch intact throughout, bilaterally Deep Tendon Reflexes: 2+ and symmetric throughout Plantars: Right: downgoing   Left: downgoing Cerebellar: normal finger-to-nose,  and normal heel-to-shin test Gait: normal gait  Lab Results: Basic Metabolic Panel: Recent Labs  Lab 04/09/18 0838  NA 140  K 4.2  CL 108  CO2 25  GLUCOSE 143*  BUN 30*  CREATININE 2.07*  CALCIUM 9.1    CBC: Recent Labs  Lab 04/09/18 0838  WBC 8.2  HGB 12.4*  HCT 39.5  MCV 89.4  PLT 243    Lipid Panel: No results for input(s): CHOL, TRIG, HDL, CHOLHDL, VLDL, LDLCALC in the last 168 hours.  CBG: No results for input(s): GLUCAP in the last 168 hours.  Imaging: Ct Head Wo  Contrast  Result Date: 04/09/2018 CLINICAL DATA:  Lightheaded.  Prior stroke. EXAM: CT HEAD WITHOUT CONTRAST TECHNIQUE: Contiguous axial images were obtained from the base of the skull through the vertex without intravenous contrast. COMPARISON:  01/24/2017 brain MRI FINDINGS: Brain: There is no evidence of acute large territory infarct, intracranial hemorrhage, mass, midline shift, or extra-axial fluid collection. A cavum septum pellucidum et vergae is incidentally noted. Patchy cerebral white matter hypodensities again demonstrate asymmetric involvement of the right frontal lobe and are nonspecific but compatible with mild-to-moderate chronic small vessel ischemic disease with chronic white matter lacunes, similar in distribution to the prior MRI. Vascular: Calcified atherosclerosis at the skull base. No hyperdense vessel. Skull: No fracture or focal osseous lesion. Sinuses/Orbits: Partially visualized postsurgical changes in the ethmoid sinuses. Mild bilateral frontal and ethmoid sinus mucosal thickening. Partially visualized mucosal thickening or trace fluid in the left maxillary sinus. Clear mastoid air cells. Bilateral cataract extraction. Other: None. IMPRESSION: 1. No evidence of acute intracranial abnormality. 2. Mild-to-moderate chronic small vessel ischemic disease. Electronically Signed   By: Logan Bores M.D.   On: 04/09/2018 12:22    Assessment and plan discussed with with attending physician and they are in agreement.    Etta Quill PA-C [facility] [phone]  04/09/2018, 1:45 PM   I have seen the patient reviewed the above note.  When  I asked him, he does endorse lightheadedness during these events.  And of note he is bradycardic.  He has a chronic left facial weakness is slightly slow on finger-nose-finger on the right, this could be due to his previous pontine infarct.   Assessment: 67 y.o. male with sudden onset of dizziness.  It is unclear whether he describes vertigo  or lightheadedness.  With his low pulse, I would be concerned for possible symptomatic bradycardia if no other explanation is found.  Given the duration of his symptoms, if an MRI is negative then I think stroke is excluded.  Stroke Risk Factors - carotid stenosis, diabetes mellitus, hyperlipidemia and hypertension  Recommend 1) MRI brain 2) further recommendations following MRI brain.  Roland Rack, MD [facility] [phone]  If 7pm- 7am, please page neurology on call as listed in [place].

## 2018-04-09 NOTE — ED Notes (Signed)
Pt states he felt his CBG was low; CBG 56. Passed stroke swallow at this time. Given peanut butter and crackers to eat.

## 2018-04-09 NOTE — Discharge Instructions (Signed)
Your MRI was reassuring.  It looks similar to the last time you  had an MRI.  No new stroke.  Please use meclizine to try to help with your dizziness.  Please return to follow-up with your primary care physician with any concerns.  Be sure to stay hydrated at home.

## 2018-04-09 NOTE — ED Provider Notes (Signed)
[facility] EMERGENCY DEPARTMENT Provider Note   CSN: [id number] Arrival date & time: 04/09/18  0801     History   Chief Complaint Chief Complaint  Patient presents with  . Dizziness    HPI Roberto Ingram is a 67 y.o. male.  The history is provided by the patient, the spouse and medical records.  Dizziness  Quality:  Head spinning, lightheadedness and room spinning Severity:  Moderate Onset quality:  Gradual Duration:  1 day Timing:  Intermittent Progression:  Partially resolved Chronicity:  Recurrent Relieved by:  Nothing Worsened by:  Nothing Ineffective treatments:  None tried Associated symptoms: diarrhea and headaches   Associated symptoms: no chest pain, no nausea, no palpitations, no shortness of breath, no vision changes, no vomiting and no weakness   Risk factors: hx of stroke     Past Medical History:  Diagnosis Date  . Anemia   . Anxiety    Generalized anxiety disorder  . Asthmatic bronchitis   . Atopic dermatitis   . BPH (benign prostatic hyperplasia)   . Cardiovascular disease   . Carotid disease, bilateral ([place])   . Colon polyp   . Constipation   . Coronary artery disease   . Diabetes mellitus without complication ([place])   . Diabetic retinopathy ([place])   . GERD (gastroesophageal reflux disease)   . Glaucoma of left eye   . Gout   . History of GI bleed   . Hypercholesterolemia   . Hypertension   . Hypomagnesemia   . Hypothyroidism   . Insomnia   . Mixed hyperlipidemia   . Myocardial infarction ([place])   . Renal disorder   . Renal insufficiency   . Sleep apnea    Obstructive sleep apnea  . Stroke ([place])   . Thyroid disease   . Vitamin D deficiency     Patient Active Problem List   Diagnosis Date Noted  . 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 History of stroke 02/11/2018  . Obstructive sleep apnea on CPAP 02/11/2018  . Hyperlipemia 03/10/2017  . OSA (obstructive sleep apnea) 03/10/2017  .  Cerebrovascular accident (CVA) due to thrombosis of basilar artery ([place])   . Acute ischemic stroke ([place]) 01/24/2017  . Essential hypertension 01/24/2017  . Hypothyroidism (acquired) 01/24/2017  . Type 2 diabetes mellitus with hyperlipidemia ([place]) 04/03/2015  . 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 COLONOSCOPY    . COLONOSCOPY WITH PROPOFOL N/A 02/24/2018   Procedure: COLONOSCOPY WITH PROPOFOL;  Surgeon: Toledo, Benay Pike, MD;  Location: [facility] ENDOSCOPY;  Service: Gastroenterology;  Laterality: N/A;  . CORONARY ANGIOPLASTY    . CORONARY ARTERY BYPASS GRAFT    . ESOPHAGOGASTRODUODENOSCOPY    . EXPLORATION POST OPERATIVE OPEN HEART    . EYE SURGERY     Lens eye surgery  . NASAL POLYP EXCISION  1994  . POLYPECTOMY    . Quadruple bypass          Home Medications    Prior to Admission medications   Medication Sig Start Date End Date Taking? Authorizing Provider  albuterol (PROVENTIL HFA;VENTOLIN HFA) 108 (90 Base) MCG/ACT inhaler Inhale 2 puffs into the lungs every 6 (six) hours as needed for wheezing or shortness of breath.     [provider]  ALLOPURINOL PO Take by mouth daily.    [provider]  ALPRAZolam Duanne Moron) 0.5 MG tablet Take 0.5 mg by mouth at bedtime as needed for sleep.  [provider]  amLODipine (NORVASC) 10 MG tablet Take 10 mg by mouth daily.    [provider]  aspirin 81 MG chewable tablet Chew by mouth.    [provider]  budesonide (PULMICORT) 180 MCG/ACT inhaler Inhale 1 puff into the lungs 2 (two) times daily as needed (for shortness of breath).     [provider]  cetirizine (ZYRTEC) 10 MG tablet Take 10 mg by mouth daily.    [provider]  clopidogrel (PLAVIX) 75 MG tablet Take 75 mg by mouth daily.    [provider]  docusate sodium (COLACE) 100 MG capsule Take 100 mg by mouth 2 (two) times daily.    [provider]  fluticasone (FLONASE) 50 MCG/ACT nasal spray Place 2 sprays into both nostrils daily as needed for allergies or rhinitis.    [provider]  insulin glargine (LANTUS) 100 UNIT/ML injection Inject 66 Units into the skin daily.     [provider]  insulin regular (NOVOLIN R,HUMULIN R) 100 units/mL injection Inject 12 to 26 units subcutaneously three times a day before each meal per sliding scale 07/10/16   [provider]  levothyroxine (SYNTHROID) 50 MCG tablet Take 50 mcg by mouth daily before breakfast.  01/12/14   [provider]  lisinopril (PRINIVIL,ZESTRIL) 40 MG tablet Take 40 mg by mouth daily.     [provider]  metoprolol tartrate (LOPRESSOR) 25 MG tablet Take 25 mg by mouth 2 (two) times daily.  03/28/14   [provider]  nitroGLYCERIN (NITROSTAT) 0.4 MG SL tablet Place 0.4 mg under the tongue every 5 (five) minutes as needed for chest pain.     [provider]  Omega-3 Fatty Acids (FISH OIL) 1000 MG CAPS Take 1,000 mg by mouth daily.     [provider]  rosuvastatin (CRESTOR) 40 MG tablet Take 40 mg by mouth daily.  03/29/14   [provider]  Specialty Vitamins Products (MAGNESIUM, AMINO ACID CHELATE,) 133 MG tablet Take 2 tablets by mouth 2 (two) times daily.    [provider]  tamsulosin (FLOMAX) 0.4 MG CAPS capsule Take 0.4 mg by mouth daily after supper.     [provider]  Vitamin D, Ergocalciferol, (DRISDOL) 50000 units CAPS capsule Take 1 tablet by mouth twice each week    [provider]  mometasone (NASONEX) 50 MCG/ACT nasal spray Place into the nose.  09/11/16  [provider]    Family History Family History  Problem Relation Age of Onset  . Atrial fibrillation Mother   . Heart disease Mother   . Stroke Mother   . Bell's palsy Mother   . Kidney disease Mother   . Cancer Mother   . Diabetes Father   . Arthritis Father   . Stroke  Brother   . Diabetes Brother   . Lung cancer Brother   . Heart attack Brother   . Cancer Brother   . Heart disease Sister   . COPD Sister   . Throat cancer Sister     Social History Social History   Tobacco Use  . Smoking status: Former Smoker    Types: Cigarettes    Last attempt to quit: 06/11/1993    Years since quitting: 24.8  . Smokeless tobacco: Never Used  Substance Use Topics  . Alcohol use: No  . Drug use: No     Allergies   Diphenhydramine hcl; Ezetimibe; Lopressor [metoprolol]; Metformin; Other; and Sulfa antibiotics  Review of Systems Review of Systems  Constitutional: Negative for chills, diaphoresis, fatigue and fever.  HENT: Negative for congestion.   Eyes: Negative for visual disturbance.  Respiratory: Negative for cough, chest tightness, shortness of breath and wheezing.   Cardiovascular: Negative for chest pain and palpitations.  Gastrointestinal: Positive for diarrhea. Negative for constipation, nausea and vomiting.  Genitourinary: Negative for flank pain.  Musculoskeletal: Negative for back pain, neck pain and neck stiffness.  Skin: Negative for rash and wound.  Neurological: Positive for dizziness, speech difficulty, light-headedness and headaches. Negative for syncope, weakness and numbness.  Psychiatric/Behavioral: Negative for agitation.  All other systems reviewed and are negative.    Physical Exam Updated Vital Signs BP 140/65 (BP Location: Right Arm)   Pulse (!) 51   Temp 97.6 F (36.4 C) (Oral)   Resp 18   Ht 5\' 11"  (1.803 m)   Wt 115.2 kg (254 lb)   SpO2 100%   BMI 35.43 kg/m   Physical Exam  Constitutional: He is oriented to person, place, and time. He appears well-developed and well-nourished. No distress.  HENT:  Head: Normocephalic and atraumatic.  Nose: Nose normal.  Mouth/Throat: Oropharynx is clear and moist. No oropharyngeal exudate.  Eyes: Pupils are equal, round, and reactive to light. Conjunctivae and EOM are  normal.  Neck: Normal range of motion. Neck supple.  Cardiovascular: Normal rate and regular rhythm.  No murmur heard. Pulmonary/Chest: Effort normal and breath sounds normal. No stridor. No respiratory distress. He has no wheezes. He exhibits no tenderness.  Abdominal: Soft. There is no tenderness. There is no guarding.  Musculoskeletal: He exhibits no edema or tenderness.  Neurological: He is alert and oriented to person, place, and time. No cranial nerve deficit or sensory deficit. He exhibits normal muscle tone. Coordination normal.  Normal finger-nose-finger testing.  Normal extraocular movements.  Symmetric smile.  Speech was clear on my evaluation.  Patient still had some intermittent dizziness, gait deferred initially.  Skin: Skin is warm and dry. He is not diaphoretic. No erythema. No pallor.  Psychiatric: He has a normal mood and affect.  Nursing note and vitals reviewed.    ED Treatments / Results  Labs (all labs ordered are listed, but only abnormal results are displayed) Labs Reviewed  BASIC METABOLIC PANEL - Abnormal; Notable for the following components:      Result Value   Glucose, Bld 143 (*)    BUN 30 (*)    Creatinine, Ser 2.07 (*)    GFR calc non Af Amer 31 (*)    GFR calc Af Amer 36 (*)    All other components within normal limits  CBC - Abnormal; Notable for the following components:   Hemoglobin 12.4 (*)    All other components within normal limits  URINALYSIS, ROUTINE W REFLEX MICROSCOPIC - Abnormal; Notable for the following components:   Hgb urine dipstick SMALL (*)    Protein, ur 100 (*)    All other components within normal limits  CBG MONITORING, ED    EKG EKG Interpretation  Date/Time:  Thursday Apr 09 2018 08:07:48 EDT Ventricular Rate:  53 PR Interval:  174 QRS Duration: 108 QT Interval:  420 QTC Calculation: 394 R Axis:   -33 Text Interpretation:  Sinus bradycardia Left axis deviation Incomplete right bundle branch block Left ventricular  hypertrophy with repolarization abnormality Abnormal ECG When compared to prior, slower rate.  No STEMI Confirmed by Antony Blackbird [phone]) on 04/09/2018 10:38:49 AM   Radiology Ct Head Wo  Contrast  Result Date: 04/09/2018 CLINICAL DATA:  Lightheaded.  Prior stroke. EXAM: CT HEAD WITHOUT CONTRAST TECHNIQUE: Contiguous axial images were obtained from the base of the skull through the vertex without intravenous contrast. COMPARISON:  01/24/2017 brain MRI FINDINGS: Brain: There is no evidence of acute large territory infarct, intracranial hemorrhage, mass, midline shift, or extra-axial fluid collection. A cavum septum pellucidum et vergae is incidentally noted. Patchy cerebral white matter hypodensities again demonstrate asymmetric involvement of the right frontal lobe and are nonspecific but compatible with mild-to-moderate chronic small vessel ischemic disease with chronic white matter lacunes, similar in distribution to the prior MRI. Vascular: Calcified atherosclerosis at the skull base. No hyperdense vessel. Skull: No fracture or focal osseous lesion. Sinuses/Orbits: Partially visualized postsurgical changes in the ethmoid sinuses. Mild bilateral frontal and ethmoid sinus mucosal thickening. Partially visualized mucosal thickening or trace fluid in the left maxillary sinus. Clear mastoid air cells. Bilateral cataract extraction. Other: None. IMPRESSION: 1. No evidence of acute intracranial abnormality. 2. Mild-to-moderate chronic small vessel ischemic disease. Electronically Signed   By: Logan Bores M.D.   On: 04/09/2018 12:22    Procedures Procedures (including critical care time)  Medications Ordered in ED Medications  LORazepam (ATIVAN) injection 1 mg (0 mg Intravenous Hold 04/09/18 1315)  sodium chloride 0.9 % bolus 500 mL (0 mLs Intravenous Stopped 04/09/18 1244)     Initial Impression / Assessment and Plan / ED Course  I have reviewed the triage vital signs and the nursing  notes.  Pertinent labs & imaging results that were available during my care of the patient were reviewed by me and considered in my medical decision making (see chart for details).     Roberto Ingram is a 67 y.o. male with a past medical history significant for CAD status post MI and CABG, diabetes, hypertension, hyperlipidemia, carotid disease and prior stroke, and thyroid disease who presents with slurred speech and dizziness since 5 PM yesterday.  Patient reports that his dizziness has improved but his wife says he still has a very mild slurred speech.  He reports that the same symptoms he had when he had a stroke just over one year ago.  He has been symptom-free for the last year.  He does report having diarrhea yesterday but denies any syncopal episodes.  He reports feeling the dizziness and mild lightheadedness.  He denies any vision changes, nausea, vomiting, numbness, tingling, or weakness of extremities.  He denies recent trauma.  No fevers, chills, congestion, cough, shortness of breath, or any chest symptoms.  He denies any recent medication changes.  He is concerned because this is how he presented when he had a stroke.  Patient did report mild headache with the pain that has improved.  On exam, patient had normal extra ocular movements.  Pupils are reactive bilaterally.  No facial droop.  Slurred speech was not appreciated by me however according to wife it is present.  Patient had normal strength in extremities and normal sensation throughout.  Lungs clear and chest nontender.  Patient appears well.  Patient will have CT had as well as screening laboratory testing.  Anticipate speaking with neurology for likely TIA versus similar stroke to prior.  Anticipate reassessment after work-up.  Diagnostic labs showed no evidence of UTI.  Metabolic panel showed more elevated creatinine from prior at 2.07 from 1.5.  No leukocytosis.  Mild anemia.  CT head revealed no acute intracranial abnormality  with some small chronic vessel disease.  Neurology was called  for possible TIA for his reported slurred speech and dizziness.  They are recommending MRI.  Anticipate reassessment after imaging.  Patient will get MRI.  If MRI is reassuring and patient is able to ambulate without difficulty and without severe dizziness, patient will likely be stable for discharge home.  Patient will likely be given prescription for meclizine to help with his dizziness.  Care transferred to Dr. Thomasene Lot while awaiting MRI results and reassessment.    Final Clinical Impressions(s) / ED Diagnoses   Final diagnoses:  Dizziness  Slurred speech    Clinical Impression: 1. Dizziness   2. Slurred speech     Disposition: Awaiting results of MRI and reassesment. Anticipate Discharge if imaging reassuring and symptoms improved.     Kaycee Haycraft, Gwenyth Allegra, MD 04/09/18 [phone]

## 2018-04-09 NOTE — ED Notes (Signed)
Patient transported to CT 

## 2018-04-09 NOTE — ED Triage Notes (Signed)
Pt arrives via PO[facility] from home with dizziness and room spinning sensation beginning yesterday at 5pm. Pt was able to eat dinner yesterday evening, no problems chewing/swallowing. Again woke this morning and felt the room spinning and weak all over unable to sit up. Pt awake, alert, appropriate. No droop, drift, slurred speech, aphasia, VANneg. VSS.

## 2018-05-18 ENCOUNTER — Encounter: Payer: Self-pay | Admitting: Nurse Practitioner

## 2018-05-18 NOTE — Progress Notes (Addendum)
Well patient trouble with a meal as they do not need to be wearing most of the right  [facility]  PATIENT: Roberto Ingram DOB: [birth year]   REASON FOR VISIT:  follow-up for CPAP compliance HISTORY FROM: Patient and wife    HISTORY OF PRESENT ILLNESS: UPDATE 7/10/2019CM Roberto Ingram, 67 year old male returns for follow-up for CPAP compliance.  He claims he has had sinus infection and did not wear it for a week.  He says he has a good mask fit.  He also says I wake up with my mask off.  Compliance data dated 02/18/2018-05/18/2018(62months) shows compliance greater than 4 hours at 53% for 48days out of 90.  Average usage 4 hours 9 minutes.  Set pressure 6 cm.  EPR level 1 AHI 3.0.  ESS 15.  He returns for reevaluation   UPDATE 4/3/2019CM Roberto Ingram, 67 year old male returns for follow-up with a history of stroke event in March 2018.  He is currently on aspirin 0.81 and Plavix was restarted by his cardiologist.  He has not had further stroke or TIA symptoms.  He has minimal bruising and no bleeding.  Blood pressure in the office today 127/71.  He remains on Crestor without myalgias.  Reviewed labs from 12/04/2017.  LDL 83, total cholesterol 145, triglycerides 78 CMP within normal limits, CBC mildly anemic at 12.0 hemoglobin A1c 8.6.  He exercises intermittently by walking and was encouraged to increase that to daily.  He continues to follow-up closely with endocrinology in [place] for his diabetes.  He has been on CPAP for about 5 months now he continues to struggle with compliance.  Data dated 12/13/2017 to 02/10/2018 shows greater than 4 hours compliance at 60%.  Average usage 4 hours 3 minutes.  Set pressure 6 cm AHI 2.1 no leaks.  He returns for reevaluation   UPDATE 10/1/18CM Roberto Ingram, 67 year old male returns for follow-up with history of stroke event in March 2018. He is currently on aspirin 325 daily without recurrent stroke or TIA symptoms. He has minimal bruising and bleeding.  Blood pressure in the office today 130/73. He remains on Crestor without myalgias. He exercises 10-15 minutes by walking daily. He is diabetic and remains on insulin with close follow-up with endocrinology in [place]. He has been using CPAP for about 2 months. He is still getting used to it .Compliance data dated 07/11/2017 through 08/09/2017 shows usage greater than 4 hours at 63% for 19 days. Average usage 6 hours 32 minutes. Set pressure 6 cm. EPR 1. AHI 4. No significant leaks. He did have pneumonia in September  and could not use his CPAP during that time. He returns for reevaluation   HISTORY 03/10/17 CM Roberto Ingram, 67 year old male returns for hospital follow-up after admission for trouble walking for a couple of days. He also had some slurring of speech. He has past medical history of diabetes ,coronary artery disease.  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 Marland Kitchen 2-D echo EF 60-65%. LDL 91 hemoglobin A1c 10.7. He was placed on aspirin and Plavix which he is to continue for 3 months and then either aspirin or Plavix alone due to intracranial stenosis. Crestor was increased to 40 mg daily. He is on sliding scale for his diabetes. His sleep study was positive for obstructive sleep apnea. He needs to be titrated her CPAP. He returns for reevaluation  REVIEW OF SYSTEMS: Full 14 system review of systems performed and notable only for those listed, all others are neg:  Constitutional: Fatigue Cardiovascular: neg Ear/Nose/Throat: neg  Skin: neg Eyes: neg Respiratory: Cough Gastroitestinal: neg  Hematology/Lymphatic: Anemia Endocrine: neg Musculoskeletal:neg Allergy/Immunology: neg Neurological: neg,  Psychiatric: Anxiety  Sleep : Obstructive sleep apnea with CPAP   ALLERGIES: Allergies  Allergen Reactions  . Diphenhydramine Hcl Other (See Comments)    "The  more I take, it makes my prostate swell..."  . Ezetimibe Hives  . Lopressor [Metoprolol] Itching  . Metformin Hives  . Other Itching and Other (See Comments)    FA Dye Prostate swelling  . Sulfa Antibiotics Hives    HOME MEDICATIONS: Outpatient Medications Prior to Visit  Medication Sig Dispense Refill  . albuterol (PROVENTIL HFA;VENTOLIN HFA) 108 (90 Base) MCG/ACT inhaler Inhale 2 puffs into the lungs every 6 (six) hours as needed for wheezing or shortness of breath.     . allopurinol (ZYLOPRIM) 100 MG tablet Take 100 mg by mouth daily as needed (gout).     . ALPRAZolam (XANAX) 0.5 MG tablet Take 0.5 mg by mouth at bedtime as needed for sleep.     Marland Kitchen amLODipine (NORVASC) 10 MG tablet Take 10 mg by mouth daily.    Marland Kitchen aspirin 81 MG chewable tablet Chew 81 mg by mouth daily.     . budesonide (PULMICORT) 180 MCG/ACT inhaler Inhale 1 puff into the lungs 2 (two) times daily as needed (for shortness of breath).     . Budesonide-Formoterol Fumarate (SYMBICORT IN) Inhale 1 puff into the lungs 2 (two) times daily as needed (shortness of breath).    . clopidogrel (PLAVIX) 75 MG tablet Take 75 mg by mouth daily.    . fluticasone (FLONASE) 50 MCG/ACT nasal spray Place 2 sprays into both nostrils daily as needed for allergies or rhinitis.    Marland Kitchen insulin glargine (LANTUS) 100 UNIT/ML injection Inject 66 Units into the skin daily.     . insulin regular (NOVOLIN R,HUMULIN R) 100 units/mL injection Inject 12 to 26 units subcutaneously three times a day before each meal per sliding scale    . levothyroxine (SYNTHROID) 50 MCG tablet Take 50 mcg by mouth daily before breakfast.     . lisinopril (PRINIVIL,ZESTRIL) 40 MG tablet Take 40 mg by mouth daily.     . meclizine (ANTIVERT) 12.5 MG tablet Take 1 tablet (12.5 mg total) by mouth 3 (three) times daily as needed for dizziness. 30 tablet 0  . metoprolol tartrate (LOPRESSOR) 25 MG tablet Take 25 mg by mouth 2 (two) times daily.     . nitroGLYCERIN (NITROSTAT) 0.4  MG SL tablet Place 0.4 mg under the tongue every 5 (five) minutes as needed for chest pain.     . Omega-3 Fatty Acids (FISH OIL) 1000 MG CAPS Take 1,000 mg by mouth daily.     . rosuvastatin (CRESTOR) 40 MG tablet Take 40 mg by mouth daily.     . tamsulosin (FLOMAX) 0.4 MG CAPS capsule Take 0.4 mg by mouth daily after supper.     . Vitamin D, Ergocalciferol, (DRISDOL) 50000 units CAPS capsule Take 1 tablet by mouth twice each week     No facility-administered medications prior to visit.     PAST MEDICAL HISTORY: Past Medical History:  Diagnosis Date  . Anemia   . Anxiety    Generalized anxiety disorder  . Asthmatic bronchitis   . Atopic dermatitis   . BPH (benign prostatic hyperplasia)   .  Cardiovascular disease   . Carotid disease, bilateral ([place])   . Colon polyp   . Constipation   . Coronary artery disease   . Diabetes mellitus without complication ([place])   . Diabetic retinopathy ([place])   . GERD (gastroesophageal reflux disease)   . Glaucoma of left eye   . Gout   . History of GI bleed   . Hypercholesterolemia   . Hypertension   . Hypomagnesemia   . Hypothyroidism   . Insomnia   . Mixed hyperlipidemia   . Myocardial infarction ([place])   . Renal disorder   . Renal insufficiency   . Sleep apnea    Obstructive sleep apnea, CPAP  . Stroke ([place])   . Thyroid disease   . Vitamin D deficiency     PAST SURGICAL HISTORY: Past Surgical History:  Procedure Laterality Date  . COLONOSCOPY    . COLONOSCOPY WITH PROPOFOL N/A 02/24/2018   Procedure: COLONOSCOPY WITH PROPOFOL;  Surgeon: Toledo, Benay Pike, MD;  Location: [facility] ENDOSCOPY;  Service: Gastroenterology;  Laterality: N/A;  . CORONARY ANGIOPLASTY    . CORONARY ARTERY BYPASS GRAFT    . ESOPHAGOGASTRODUODENOSCOPY    . EXPLORATION POST OPERATIVE OPEN HEART    . EYE SURGERY     Lens eye surgery  . NASAL POLYP EXCISION  1994  . POLYPECTOMY    . Quadruple bypass      FAMILY HISTORY: Family History  Problem Relation Age of  Onset  . Atrial fibrillation Mother   . Heart disease Mother   . Stroke Mother   . Bell's palsy Mother   . Kidney disease Mother   . Cancer Mother   . Diabetes Father   . Arthritis Father   . Stroke Brother   . Diabetes Brother   . Lung cancer Brother   . Heart attack Brother   . Cancer Brother   . Heart disease Sister   . COPD Sister   . Throat cancer Sister     SOCIAL HISTORY: See above in HPI.   PHYSICAL EXAM  Vitals:   05/20/18 1028  BP: 140/73  Pulse: (!) 55  Weight: 254 lb (115.2 kg)  Height: 5\' 11"  (1.803 m)   Body mass index is 35.43 kg/m.  Generalized: Well developed, obese male in no acute distress  Head: normocephalic and atraumatic,. Oropharynx benign  Neck: Supple, no carotid bruits  Cardiac: Regular rate rhythm, no murmur  Musculoskeletal: No deformity   Neurological examination   Mentation: Alert oriented to time, place, history taking. Attention span and concentration appropriate. Recent and remote memory intact.  Follows all commands speech and language fluent. ESS 15  Cranial nerve II-XII: Pupils were equal round reactive to light extraocular movements were full, visual field were full on confrontational test. Facial sensation and strength were normal. hearing was intact to finger rubbing bilaterally. Uvula tongue midline. head turning and shoulder shrug were normal and symmetric.Tongue protrusion into cheek strength was normal. Motor: normal bulk and tone, full strength in the BUE, BLE, Except mild left hand grip weakness Sensory: normal and symmetric to light touch,  Coordination: finger-nose-finger, heel-to-shin bilaterally, no dysmetria Reflexes: 1+ upper lower and symmetric plantar responses were flexor bilaterally. Gait and Station: Rising up from seated position without assistance, normal stance,  moderate stride, good arm swing, smooth turning, able to perform tiptoe, and heel walking without difficulty. Tandem gait is steady. No assistive  device  DIAGNOSTIC DATA (LABS, IMAGING, TESTING) - I reviewed patient records, labs, notes, testing and imaging  myself where available.  Lab Results  Component Value Date   WBC 8.2 04/09/2018   HGB 12.4 (L) 04/09/2018   HCT 39.5 04/09/2018   MCV 89.4 04/09/2018   PLT 243 04/09/2018      Component Value Date/Time   NA 140 04/09/2018 0838   K 4.2 04/09/2018 0838   CL 108 04/09/2018 0838   CO2 25 04/09/2018 0838   GLUCOSE 143 (H) 04/09/2018 0838   BUN 30 (H) 04/09/2018 0838   CREATININE 2.07 (H) 04/09/2018 0838   CALCIUM 9.1 04/09/2018 0838   GFRNONAA 31 (L) 04/09/2018 0838   GFRAA 36 (L) 04/09/2018 0838   Lab Results  Component Value Date   CHOL 147 01/24/2017   HDL 41 01/24/2017   LDLCALC 91 01/24/2017   TRIG 74 01/24/2017   CHOLHDL 3.6 01/24/2017   Lab Results  Component Value Date   HGBA1C 10.7 (H) 01/24/2017    ASSESSMENT AND PLAN  67 y.o. year old male  has a past medical history of Anemia, Anxiety, Asthmatic bronchitis, Atopic dermatitis, BPH (benign prostatic hyperplasia), Cardiovascular disease, Carotid disease, bilateral ([place]), Colon polyp, Constipation, Coronary artery disease, Diabetes mellitus without complication ([place]), Diabetic retinopathy ([place]), GERD (gastroesophageal reflux disease), Glaucoma of left eye, Gout, History of GI bleed, Hypercholesterolemia, Hypertension, Hypomagnesemia, Hypothyroidism, Insomnia, Mixed hyperlipidemia, Myocardial infarction ([place]), Renal disorder, Renal insufficiency, Sleep apnea, Stroke ([place]), Thyroid disease, and Vitamin D deficiency. And stroke here  For  follow-up. Patient was diagnosed with obstructive sleep apnea and here for  compliance.Data dated 02/18/2018-05/18/2018(91months) shows compliance greater than 4 hours at 53% for 48days out of 90.  Average usage 4 hours 9 minutes.  Set pressure 6 cm.  EPR level 1 AHI 3.0.  ESS 15.  PLAN:  CPAP compliance 53% for 90 days needs to be greater than 70 Continue same settings Follow-up  in 3 months  Dennie Bible, [facility], [facility], APRN [facility] [address] [place], [place] [address] [phone]  I reviewed the above note and documentation by the Nurse Practitioner and agree with the history, physical exam, assessment and plan as outlined above. I was immediately available for face-to-face consultation. Star Age, MD, PhD [facility])

## 2018-05-20 ENCOUNTER — Ambulatory Visit (INDEPENDENT_AMBULATORY_CARE_PROVIDER_SITE_OTHER): Payer: Medicare Other | Admitting: Nurse Practitioner

## 2018-05-20 ENCOUNTER — Encounter: Payer: Self-pay | Admitting: Nurse Practitioner

## 2018-05-20 VITALS — BP 140/73 | HR 55 | Ht 71.0 in | Wt 254.0 lb

## 2018-05-20 DIAGNOSIS — G4733 Obstructive sleep apnea (adult) (pediatric): Secondary | ICD-10-CM

## 2018-05-20 DIAGNOSIS — Z9989 Dependence on other enabling machines and devices: Secondary | ICD-10-CM | POA: Diagnosis not present

## 2018-05-20 NOTE — Patient Instructions (Signed)
CPAP compliance 53% for 90 days needs to be greater than 70 Continue same settings Follow-up in 3 months

## 2018-08-24 ENCOUNTER — Telehealth: Payer: Self-pay | Admitting: Nurse Practitioner

## 2018-08-24 NOTE — Telephone Encounter (Signed)
Received labs drawn 08/14/2018 Lipid profile triglycerides 85, cholesterol 160 LDL 96 CMP glucose 121 creatinine 1.71 otherwise normal Uric acid 8.3 CBC hemoglobin 12.7 otherwise normal Vitamin D 45

## 2018-09-01 ENCOUNTER — Encounter: Payer: Self-pay | Admitting: Nurse Practitioner

## 2018-09-02 NOTE — Progress Notes (Signed)
[facility]  PATIENT: Roberto Ingram DOB: 26-Feb-1951   REASON FOR VISIT:  follow-up for CPAP compliance HISTORY FROM: Patient and wife    HISTORY OF PRESENT ILLNESS: UPDATE 10/24/2019CM Roberto Ingram, 67 year old male returns for follow-up with history of obstructive sleep apnea here for CPAP compliance.  Data dated 08/03/2018-09/01/2018 shows compliance greater than 4 hours at 87%.  Average usage 5 hours 11 minutes.  Set pressure 6 cm.  EPR level 1.  AHI 2.4 ESS 11FSS 22.  He returns for reevaluation UPDATE 7/10/2019CM Roberto Ingram, 67 year old male returns for follow-up for CPAP compliance.  He claims he has had sinus infection and did not wear it for a week.  He says he has a good mask fit.  He also says I wake up with my mask off.  Compliance data dated 02/18/2018-05/18/2018(73months) shows compliance greater than 4 hours at 53% for 48days out of 90.  Average usage 4 hours 9 minutes.  Set pressure 6 cm.  EPR level 1 AHI 3.0.  ESS 15.  He returns for reevaluation   UPDATE 4/3/2019CM Roberto Ingram, 67 year old male returns for follow-up with a history of stroke event in March 2018.  He is currently on aspirin 0.81 and Plavix was restarted by his cardiologist.  He has not had further stroke or TIA symptoms.  He has minimal bruising and no bleeding.  Blood pressure in the office today 127/71.  He remains on Crestor without myalgias.  Reviewed labs from 12/04/2017.  LDL 83, total cholesterol 145, triglycerides 78 CMP within normal limits, CBC mildly anemic at 12.0 hemoglobin A1c 8.6.  He exercises intermittently by walking and was encouraged to increase that to daily.  He continues to follow-up closely with endocrinology in [place] for his diabetes.  He has been on CPAP for about 5 months now he continues to struggle with compliance.  Data dated 12/13/2017 to 02/10/2018 shows greater than 4 hours compliance at 60%.  Average usage 4 hours 3 minutes.  Set pressure 6 cm AHI 2.1 no leaks.  He returns  for reevaluation   UPDATE 10/1/18CM Roberto Ingram, 67 year old male returns for follow-up with history of stroke event in March 2018. He is currently on aspirin 325 daily without recurrent stroke or TIA symptoms. He has minimal bruising and bleeding. Blood pressure in the office today 130/73. He remains on Crestor without myalgias. He exercises 10-15 minutes by walking daily. He is diabetic and remains on insulin with close follow-up with endocrinology in [place]. He has been using CPAP for about 2 months. He is still getting used to it .Compliance data dated 07/11/2017 through 08/09/2017 shows usage greater than 4 hours at 63% for 19 days. Average usage 6 hours 32 minutes. Set pressure 6 cm. EPR 1. AHI 4. No significant leaks. He did have pneumonia in September  and could not use his CPAP during that time. He returns for reevaluation   HISTORY 03/10/17 CM Roberto Ingram, 67 year old male returns for hospital follow-up after admission for trouble walking for a couple of days. He also had some slurring of speech. He has past medical history of diabetes ,coronary artery disease.  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 Marland Kitchen 2-D echo EF 60-65%. LDL 91 hemoglobin A1c 10.7. He was placed on aspirin and Plavix which he is to continue for 3 months and then either aspirin or Plavix alone due  to intracranial stenosis. Crestor was increased to 40 mg daily. He is on sliding scale for his diabetes. His sleep study was positive for obstructive sleep apnea. He needs to be titrated her CPAP. He returns for reevaluation   REVIEW OF SYSTEMS: Full 14 system review of systems performed and notable only for those listed, all others are neg:  Constitutional: Fatigue Cardiovascular: neg Ear/Nose/Throat: neg  Skin: neg Eyes: neg Respiratory: Cough Gastroitestinal: neg  Hematology/Lymphatic:  Anemia Endocrine: neg Musculoskeletal:neg Allergy/Immunology: neg Neurological: neg,  Psychiatric: Anxiety  Sleep : Obstructive sleep apnea with CPAP   ALLERGIES: Allergies  Allergen Reactions  . Diphenhydramine Hcl Other (See Comments)    "The more I take, it makes my prostate swell..."  . Ezetimibe Hives  . Lopressor [Metoprolol] Itching  . Metformin Hives  . Other Itching and Other (See Comments)    FA Dye Prostate swelling  . Sulfa Antibiotics Hives    HOME MEDICATIONS: Outpatient Medications Prior to Visit  Medication Sig Dispense Refill  . albuterol (PROVENTIL HFA;VENTOLIN HFA) 108 (90 Base) MCG/ACT inhaler Inhale 2 puffs into the lungs every 6 (six) hours as needed for wheezing or shortness of breath.     . allopurinol (ZYLOPRIM) 100 MG tablet Take 100 mg by mouth daily as needed (gout).     . ALPRAZolam (XANAX) 0.5 MG tablet Take 0.5 mg by mouth at bedtime as needed for sleep.     Marland Kitchen amLODipine (NORVASC) 10 MG tablet Take 10 mg by mouth daily.    Marland Kitchen aspirin 81 MG chewable tablet Chew 81 mg by mouth daily.     . budesonide (PULMICORT) 180 MCG/ACT inhaler Inhale 1 puff into the lungs 2 (two) times daily as needed (for shortness of breath).     . Budesonide-Formoterol Fumarate (SYMBICORT IN) Inhale 1 puff into the lungs 2 (two) times daily as needed (shortness of breath).    . clopidogrel (PLAVIX) 75 MG tablet Take 75 mg by mouth daily.    . fluticasone (FLONASE) 50 MCG/ACT nasal spray Place 2 sprays into both nostrils daily as needed for allergies or rhinitis.    Marland Kitchen insulin glargine (LANTUS) 100 UNIT/ML injection Inject 66 Units into the skin daily.     . insulin regular (NOVOLIN R,HUMULIN R) 100 units/mL injection Inject 12 to 26 units subcutaneously three times a day before each meal per sliding scale    . levothyroxine (SYNTHROID) 50 MCG tablet Take 50 mcg by mouth daily before breakfast.     . lisinopril (PRINIVIL,ZESTRIL) 40 MG tablet Take 40 mg by mouth daily.     .  meclizine (ANTIVERT) 12.5 MG tablet Take 1 tablet (12.5 mg total) by mouth 3 (three) times daily as needed for dizziness. 30 tablet 0  . metoprolol tartrate (LOPRESSOR) 25 MG tablet Take 25 mg by mouth 2 (two) times daily.     . nitroGLYCERIN (NITROSTAT) 0.4 MG SL tablet Place 0.4 mg under the tongue every 5 (five) minutes as needed for chest pain.     . Omega-3 Fatty Acids (FISH OIL) 1000 MG CAPS Take 1,000 mg by mouth daily.     . rosuvastatin (CRESTOR) 40 MG tablet Take 40 mg by mouth daily.     . tamsulosin (FLOMAX) 0.4 MG CAPS capsule Take 0.4 mg by mouth daily after supper.     . Vitamin D, Ergocalciferol, (DRISDOL) 50000 units CAPS capsule Take 1 tablet by mouth twice each week     No facility-administered medications prior to visit.  PAST MEDICAL HISTORY: Past Medical History:  Diagnosis Date  . Anemia   . Anxiety    Generalized anxiety disorder  . Asthmatic bronchitis   . Atopic dermatitis   . BPH (benign prostatic hyperplasia)   . Cardiovascular disease   . Carotid disease, bilateral ([place])   . Colon polyp   . Constipation   . Coronary artery disease   . Diabetes mellitus without complication ([place])   . Diabetic retinopathy ([place])   . GERD (gastroesophageal reflux disease)   . Glaucoma of left eye   . Gout   . History of GI bleed   . Hypercholesterolemia   . Hypertension   . Hypomagnesemia   . Hypothyroidism   . Insomnia   . Mixed hyperlipidemia   . Myocardial infarction ([place])   . Renal disorder   . Renal insufficiency   . Sleep apnea    Obstructive sleep apnea, CPAP  . Stroke ([place])   . Thyroid disease   . Vitamin D deficiency     PAST SURGICAL HISTORY: Past Surgical History:  Procedure Laterality Date  . COLONOSCOPY    . COLONOSCOPY WITH PROPOFOL N/A 02/24/2018   Procedure: COLONOSCOPY WITH PROPOFOL;  Surgeon: Toledo, Benay Pike, MD;  Location: [facility] ENDOSCOPY;  Service: Gastroenterology;  Laterality: N/A;  . CORONARY ANGIOPLASTY    . CORONARY ARTERY  BYPASS GRAFT    . ESOPHAGOGASTRODUODENOSCOPY    . EXPLORATION POST OPERATIVE OPEN HEART    . EYE SURGERY     Lens eye surgery  . NASAL POLYP EXCISION  1994  . POLYPECTOMY    . Quadruple bypass      FAMILY HISTORY: Family History  Problem Relation Age of Onset  . Atrial fibrillation Mother   . Heart disease Mother   . Stroke Mother   . Bell's palsy Mother   . Kidney disease Mother   . Cancer Mother   . Diabetes Father   . Arthritis Father   . Stroke Brother   . Diabetes Brother   . Lung cancer Brother   . Heart attack Brother   . Cancer Brother   . Heart disease Sister   . COPD Sister   . Throat cancer Sister     SOCIAL HISTORY: See above in HPI.   PHYSICAL EXAM  Vitals:   09/03/18 0837  BP: (!) 144/75  Pulse: (!) 56  Weight: 256 lb (116.1 kg)  Height: 5\' 11"  (1.803 m)   Body mass index is 35.7 kg/m.  Generalized: Well developed, obese male in no acute distress  Head: normocephalic and atraumatic,. Oropharynx benign  Neck: Supple,  Cardiac: Regular rate rhythm, no murmur  Musculoskeletal: No deformity   Neurological examination   Mentation: Alert oriented to time, place, history taking. Attention span and concentration appropriate. Recent and remote memory intact.  Follows all commands speech and language fluent. ESS 11. FSS 22  Cranial nerve II-XII: Pupils were equal round reactive to light extraocular movements were full, visual field were full on confrontational test. Facial sensation and strength were normal. hearing was intact to finger rubbing bilaterally. Uvula tongue midline. head turning and shoulder shrug were normal and symmetric.Tongue protrusion into cheek strength was normal. Motor: normal bulk and tone, full strength in the BUE, BLE,  Sensory: normal and symmetric to light touch,  Coordination: finger-nose-finger, heel-to-shin bilaterally, no dysmetria Reflexes: 1+ upper lower and symmetric plantar responses were flexor bilaterally. Gait and  Station: Rising up from seated position without assistance, normal stance,  moderate stride, good  arm swing, smooth turning, able to perform tiptoe, and heel walking without difficulty. Tandem gait is steady. No assistive device  DIAGNOSTIC DATA (LABS, IMAGING, TESTING) - I reviewed patient records, labs, notes, testing and imaging myself where available.  Lab Results  Component Value Date   WBC 8.2 04/09/2018   HGB 12.4 (L) 04/09/2018   HCT 39.5 04/09/2018   MCV 89.4 04/09/2018   PLT 243 04/09/2018      Component Value Date/Time   NA 140 04/09/2018 0838   K 4.2 04/09/2018 0838   CL 108 04/09/2018 0838   CO2 25 04/09/2018 0838   GLUCOSE 143 (H) 04/09/2018 0838   BUN 30 (H) 04/09/2018 0838   CREATININE 2.07 (H) 04/09/2018 0838   CALCIUM 9.1 04/09/2018 0838   GFRNONAA 31 (L) 04/09/2018 0838   GFRAA 36 (L) 04/09/2018 0838   Lab Results  Component Value Date   CHOL 147 01/24/2017   HDL 41 01/24/2017   LDLCALC 91 01/24/2017   TRIG 74 01/24/2017   CHOLHDL 3.6 01/24/2017   Lab Results  Component Value Date   HGBA1C 10.7 (H) 01/24/2017    ASSESSMENT AND PLAN  67 y.o. year old male  has a past medical history of Anemia, Anxiety, Asthmatic bronchitis, Atopic dermatitis, BPH (benign prostatic hyperplasia), Cardiovascular disease, Carotid disease, bilateral ([place]), Colon polyp, Constipation, Coronary artery disease, Diabetes mellitus without complication ([place]), Diabetic retinopathy ([place]), GERD (gastroesophageal reflux disease), Glaucoma of left eye, Gout, History of GI bleed, Hypercholesterolemia, Hypertension, Hypomagnesemia, Hypothyroidism, Insomnia, Mixed hyperlipidemia, Myocardial infarction ([place]), Renal disorder, Renal insufficiency, Sleep apnea, Stroke ([place]), Thyroid disease, and Vitamin D deficiency. And stroke here  For  follow-up. Patient was diagnosed with obstructive sleep apnea and here for  Compliance.Data dated 08/03/2018-09/01/2018 shows compliance greater than 4 hours at  87%.  Average usage 5 hours 11 minutes.  Set pressure 6 cm.  EPR level 1.  AHI 2.4 ESS 11FSS 22.  He was commended for his excellent compliance     PLAN:  CPAP compliance 87% much improved Continue same settings Follow-up in 6  Months then yearly Dennie Bible, [facility], [facility], [facility] [address] [place], [place] [address] [phone]

## 2018-09-03 ENCOUNTER — Encounter: Payer: Self-pay | Admitting: Nurse Practitioner

## 2018-09-03 ENCOUNTER — Ambulatory Visit (INDEPENDENT_AMBULATORY_CARE_PROVIDER_SITE_OTHER): Payer: Medicare Other | Admitting: Nurse Practitioner

## 2018-09-03 VITALS — BP 144/75 | HR 56 | Ht 71.0 in | Wt 256.0 lb

## 2018-09-03 DIAGNOSIS — G4733 Obstructive sleep apnea (adult) (pediatric): Secondary | ICD-10-CM

## 2018-09-03 DIAGNOSIS — Z9989 Dependence on other enabling machines and devices: Secondary | ICD-10-CM

## 2018-09-03 NOTE — Patient Instructions (Signed)
CPAP compliance 87% much improved Continue same settings Follow-up in 6  Months then yearly

## 2019-03-03 ENCOUNTER — Telehealth: Payer: Self-pay | Admitting: Neurology

## 2019-03-03 NOTE — Telephone Encounter (Signed)
Due to current COVID 19 pandemic, our office is severely reducing in office visits until further notice, in order to minimize the risk to our patients and healthcare providers.   Called patient to offer virtual visit for his May appointment. Patient declined virtual, as he [place] not [place] a secure [place] Patient accepted a telephone visit, however. Patient understands that he will receive 2 calls prior to appointment, one from RN to update chart and another from front office staff 30 minutes prior to complete the check-in process.  Pt understands that although there may be some limitations with this type of visit, we will take all precautions to reduce any security or privacy concerns.  Pt understands that this will be treated like an in office visit and we will file with pt's insurance, and there may be a patient responsible charge related to this service.

## 2019-03-08 ENCOUNTER — Ambulatory Visit: Payer: Medicare Other | Admitting: Neurology

## 2019-03-14 ENCOUNTER — Encounter: Payer: Self-pay | Admitting: Neurology

## 2019-03-14 ENCOUNTER — Encounter: Payer: Self-pay | Admitting: Family Medicine

## 2019-03-15 NOTE — Telephone Encounter (Signed)
I called pt, spoke pt's wife Vivien Rota, per DPR. Pt's meds, allergies, and PMH were updated.  Pt's wife asks that Dr. Rexene Alberts call pt's house number tomorrow.

## 2019-03-16 ENCOUNTER — Encounter: Payer: Self-pay | Admitting: Neurology

## 2019-03-16 ENCOUNTER — Other Ambulatory Visit: Payer: Self-pay

## 2019-03-16 ENCOUNTER — Ambulatory Visit (INDEPENDENT_AMBULATORY_CARE_PROVIDER_SITE_OTHER): Payer: Medicare Other | Admitting: Neurology

## 2019-03-16 DIAGNOSIS — Z9989 Dependence on other enabling machines and devices: Secondary | ICD-10-CM | POA: Diagnosis not present

## 2019-03-16 DIAGNOSIS — G4733 Obstructive sleep apnea (adult) (pediatric): Secondary | ICD-10-CM

## 2019-03-16 NOTE — Progress Notes (Signed)
Interim history:  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 The patient is unaccompanied today and joins via his home phone, I am located in my office. He has seen Roberto Ingram in the interim on 05/20/2018 as well as 09/03/2018.  Today, 03/16/2019: Please also see below for virtual visit documentation.  I reviewed his CPAP compliance data from 02/13/2019 through 03/14/2019 which is a total of 30 days, during which time he used his machine 29 days with percent used days greater than 4 hours at 70%, indicating adequate compliance with an average usage of 4 hours and 33 minutes, residual AHI at goal at 1.8 per hour, leak on the higher end with the 95th percentile at 17.5 L/m on a pressure of 6 cm.   Previously:  I first met him on 02/19/2017 at the request of Dr. Erlinda Ingram, at which time he reported snoring and excessive daytime somnolence. He was advised to return for sleep study testing. He had a baseline sleep study, followed by a titration study. Baseline sleep study from 03/06/2017 showed a sleep efficiency of only 52.1% with a sleep latency of 5 minutes and wake after sleep onset high at 203 minutes. He had a decreased percentage of REM sleep and increased percentage of stage II sleep. Overall AHI was 12 per hour, REM AHI was 30.9 per hour, supine AHI 12.9 per hour. Average oxygen saturation was 97%, nadir was 75%. He had severe PLMS with minimal arousals. Based on his medical history and test results I suggested he return for a full night titration study. He had this on 03/18/2017. Sleep latency was 24 minutes, sleep efficiency 78.5% with wake after sleep onset of 73.5 minutes. He had 14% of REM sleep and slow-wave sleep was  16.5%. CPAP was titrated from 5 cm to 6 cm. On the final pressure his AHI was 0 per hour. Nonsupine REM sleep achieved and oxygen nadir of 90%. He had no significant PLMS during this study, EKG showed occasional PACs. Based on his test results I prescribed CPAP therapy for home use.  I reviewed his CPAP compliance data from 06/24/2017 through 07/23/2017, which is a total of 30 days, during which time he used his CPAP 24 days with percent used days greater than 4 hours at 63%, indicating suboptimal compliance with an average usage of 6 hours and 2 minutes and recent decline in usage. Average AHI 4 per hour, leak acceptable with the 95th percentile at 17.7 L/m on a pressure of 6 cm. In the past 65 days his compliance percentage was 69%. However, he had excellent compliance through most of August, with a compliance percentage 4 over 4 hours of 97%.   02/19/2017: (He) reports snoring and excessive daytime somnolence. I reviewed his hospital records including discharge summary and imaging test results. He had a brain MRI without contrast as well as MRA head and MRA a neck which showed: MRI HEAD: Acute RIGHT pontine small vessel infarct. Mild to moderate chronic small vessel ischemic disease. Old small RIGHT frontal lobe infarcts.   MRA HEAD: No emergent large vessel occlusion. 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 Moderate stenosis RIGHT supraclinoid internal carotid artery and distal basilar artery compatible with atherosclerosis.   MRA NECK: Limited time-of-flight technique. Moderate stenosis RIGHT V2 segment. No hemodynamically  significant stenosis of the carotid artery's.   2D echo was benign on 01/24/2017, EF of 60-65%, normal wall motion, mild LVH.   His Epworth sleepiness score is 17 out of 24, fatigue score is 35 out of 63. He has 3 grown children. He lives with his wife. He quit smoking in 1994, he does not drink alcohol on a regular basis and does not take illicit drugs. He drinks  sodas 2-4 times per week, not daily and usually decaf coffee occasionally.    Recent A1c 10.1 on 02/11/17 and crea of 1.9, checked by PCP, Roberto Ingram.  He takes Xanax 0.5 mg qHS.  He does endorse mild RLS symptoms, moves his feet and wiggles in bed per wife. She has noted breathing pauses while he is asleep.  Bedtime is around 11:30 PM. He has been taking Xanax every night for the past several years, originally was started by his cardiologist as I understand. Wakeup time varies. He has nocturia 3-4 times in an average night and has occasional morning headaches. His sister has obstructive sleep apnea as he recalls, may have a CPAP machine.    His Past Medical History Is Significant For: Past Medical History:  Diagnosis Date  . Anemia   . Anxiety    Generalized anxiety disorder  . Asthmatic bronchitis   . Atopic dermatitis   . BPH (benign prostatic hyperplasia)   . Cardiovascular disease   . Carotid disease, bilateral ([place])   . Colon polyp   . Constipation   . Coronary artery disease   . Diabetes mellitus without complication ([place])   . Diabetic retinopathy ([place])   . GERD (gastroesophageal reflux disease)   . Glaucoma of left eye   . Gout   . History of GI bleed   . Hypercholesterolemia   . Hypertension   . Hypomagnesemia   . Hypothyroidism   . Insomnia   . Mixed hyperlipidemia   . Myocardial infarction ([place])   . Renal disorder   . Renal insufficiency   . Sleep apnea    Obstructive sleep apnea, CPAP  . Stroke ([place])   . Thyroid disease   . Vitamin D deficiency     His Past Surgical History Is Significant For: Past Surgical History:  Procedure Laterality Date  . COLONOSCOPY    . COLONOSCOPY WITH PROPOFOL N/A 02/24/2018   Procedure: COLONOSCOPY WITH PROPOFOL;  Surgeon: Toledo, Benay Pike, MD;  Location: [facility] ENDOSCOPY;  Service: Gastroenterology;  Laterality: N/A;  . CORONARY ANGIOPLASTY    . CORONARY ARTERY BYPASS GRAFT    . ESOPHAGOGASTRODUODENOSCOPY    . EXPLORATION  POST OPERATIVE OPEN HEART    . EYE SURGERY     Lens eye surgery  . NASAL POLYP EXCISION  1994  . POLYPECTOMY    . Quadruple bypass      His Family History Is Significant For: Family History  Problem Relation Age of Onset  . Atrial fibrillation Mother   . Heart disease Mother   . Stroke Mother   . Bell's palsy Mother   . Kidney disease Mother   . Cancer Mother   . Diabetes Father   . Arthritis Father   . Stroke Brother   . Diabetes Brother   . Lung cancer Brother   . Heart attack Brother   . Cancer Brother   . Heart disease Sister   . COPD Sister   . Throat cancer Sister     His Social History Is Significant For: Social History   Socioeconomic  History  . Marital status: Married    Spouse name: Roberto Ingram  . Number of children: 3  . Years of education: 97  . Highest education level: High school graduate  Occupational History  . Occupation: retired  Scientific laboratory technician  . Financial resource strain: Somewhat hard  . Food insecurity:    Worry: Sometimes true    Inability: Sometimes true  . Transportation needs:    Medical: No    Non-medical: No  Tobacco Use  . Smoking status: Former Smoker    Types: Cigarettes    Last attempt to quit: 06/11/1993    Years since quitting: 25.7  . Smokeless tobacco: Never Used  Substance and Sexual Activity  . Alcohol use: No  . Drug use: No  . Sexual activity: Not on file    Comment: Married   Lifestyle  . Physical activity:    Days per week: 2 days    Minutes per session: 10 min  . Stress: Only a little  Relationships  . Social connections:    Talks on phone: More than three times a week    Gets together: Once a week    Attends religious service: More than 4 times per year    Active member of club or organization: Yes    Attends meetings of clubs or organizations: More than 4 times per year    Relationship status: Married  Other Topics Concern  . Not on file  Social History Narrative  . Not on file    His Allergies Are:   Allergies  Allergen Reactions  . Diphenhydramine Hcl Other (See Comments)    "The more I take, it makes my prostate swell..."  . Ezetimibe Hives  . Lopressor [Metoprolol] Itching  . Metformin Hives  . Other Itching and Other (See Comments)    FA Dye Prostate swelling  . Sulfa Antibiotics Hives  :   His Current Medications Are:  Outpatient Encounter Medications as of 03/16/2019  Medication Sig  . albuterol (PROVENTIL HFA;VENTOLIN HFA) 108 (90 Base) MCG/ACT inhaler Inhale 2 puffs into the lungs every 6 (six) hours as needed for wheezing or shortness of breath.   . allopurinol (ZYLOPRIM) 100 MG tablet Take 100 mg by mouth daily as needed (gout).   . ALPRAZolam (XANAX) 0.5 MG tablet Take 0.5 mg by mouth at bedtime as needed for sleep.   Marland Kitchen amLODipine (NORVASC) 10 MG tablet Take 10 mg by mouth daily.  Marland Kitchen aspirin 81 MG chewable tablet Chew 81 mg by mouth daily.   . budesonide (PULMICORT) 180 MCG/ACT inhaler Inhale 1 puff into the lungs 2 (two) times daily as needed (for shortness of breath).   . Budesonide-Formoterol Fumarate (SYMBICORT IN) Inhale 1 puff into the lungs 2 (two) times daily as needed (shortness of breath).  . clopidogrel (PLAVIX) 75 MG tablet Take 75 mg by mouth daily.  . fluticasone (FLONASE) 50 MCG/ACT nasal spray Place 2 sprays into both nostrils daily as needed for allergies or rhinitis.  Marland Kitchen insulin glargine (LANTUS) 100 UNIT/ML injection Inject 66 Units into the skin daily.   . insulin regular (NOVOLIN R,HUMULIN R) 100 units/mL injection Inject 12 to 26 units subcutaneously three times a day before each meal per sliding scale  . levothyroxine (SYNTHROID) 50 MCG tablet Take 50 mcg by mouth daily before breakfast.   . lisinopril (PRINIVIL,ZESTRIL) 40 MG tablet Take 40 mg by mouth daily.   . meclizine (ANTIVERT) 12.5 MG tablet Take 1 tablet (12.5 mg total) by mouth 3 (three)  times daily as needed for dizziness.  . metoprolol tartrate (LOPRESSOR) 25 MG tablet Take 25 mg by mouth 2  (two) times daily.   . nitroGLYCERIN (NITROSTAT) 0.4 MG SL tablet Place 0.4 mg under the tongue every 5 (five) minutes as needed for chest pain.   . Omega-3 Fatty Acids (FISH OIL) 1000 MG CAPS Take 1,000 mg by mouth daily.   . rosuvastatin (CRESTOR) 40 MG tablet Take 40 mg by mouth daily.   . tamsulosin (FLOMAX) 0.4 MG CAPS capsule Take 0.4 mg by mouth daily after supper.   . Vitamin D, Ergocalciferol, (DRISDOL) 50000 units CAPS capsule Take 1 tablet by mouth twice each week  . [DISCONTINUED] mometasone (NASONEX) 50 MCG/ACT nasal spray Place into the nose.   No facility-administered encounter medications on file as of 03/16/2019.   :  Review of Systems:  Out of a complete 14 point review of systems, all are reviewed and negative with the exception of these symptoms as listed below:  Virtual Visit via Telephone Note on 03/16/19:   I connected with Roberto Ingram on 03/16/19 at 11:30 AM EDT by telephone and verified that I am speaking with the correct person using two identifiers.   I discussed the limitations, risks, security and privacy concerns of performing an evaluation and management service by telephone and the availability of in person appointments. I also discussed with the patient that there may be a patient responsible charge related to this service. The patient expressed understanding and agreed to proceed.   History of Present Illness: He reportsdoing reasonably well with his CPAP, he has started using a different style of mask in the past 2 weeks, switched from a fullface mask to a nasal pillows. He is adjusting well to it. The reports that his fullface mask previously was causing his bronchitis to be worse. He was recently seen in the hospital in the ER in [place] about 3 weeks ago he reports for chest pain, he was found to have very high blood sugar values in the 500 range per his report as well as systolic blood pressure above 200. He was treated for bronchitis with a Z-Pak per PCP as  well as prednisone. He had to stop the prednisone. He does have a pulmonologist, sees him about 3 monthly. He tracks his CPAP usage on the machine. He estimates that he gets about 4-5 hours of usage which is very accurate as his average usage is 4-1/2 hours. He admits that his diabetes numbers and A1c are not optimal currently.     Observations/Objective:  There are no recent vital signs available for my review in his chart, the most recent vital signs in his chart are from 09/03/2018. Blood pressure by his report this morning was 135/72. He is in no acute distress, pleasant, conversant, good comprehension, good language skills, speech is clear without dysarthria, hypophonia or voice tremor noted.  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 He is compliant with his CPAP of 6 cm via nasal pillows. He is commended for his treatment adherence. He is up-to-date with his supplies. From a sleep apnea standpoint he can follow-up in 1 year routinely, he can see one of our nurse practitioners. I answered all his questions today  and he was in agreement.   Follow Up Instructions:  1. Continue using CPAP regularly with full compliance, patient is commended for treatment adherence. 2. Follow-up yearly, with NP next time.  3. CPAP supply order is up to date, DME company in [place]. 4. Call or email through My Chart for any interim questions or concerns.   I discussed the assessment and treatment plan with the patient. The patient was provided an opportunity to ask questions and all were answered. The patient agreed with the plan and demonstrated an understanding of the instructions.    The patient was advised to call back or seek an in-person evaluation if the symptoms worsen or if the condition fails to improve as anticipated.  I provided 16 minutes of non-face-to-face time during this encounter.   Star Age, MD

## 2019-03-16 NOTE — Patient Instructions (Signed)
Given over the phone during today's phone call virtual visit.

## 2019-04-01 ENCOUNTER — Telehealth: Payer: Self-pay | Admitting: Neurology

## 2019-04-01 NOTE — Telephone Encounter (Signed)
Spoke to patient wife on dpr and made patient follow up with the NP. She is aware of time and day.

## 2020-02-28 ENCOUNTER — Telehealth: Payer: Self-pay | Admitting: Adult Health

## 2020-02-28 NOTE — Telephone Encounter (Signed)
Lvm for patient to call back to r/s 05/05 appointment due to provider being out that day.

## 2020-03-15 ENCOUNTER — Ambulatory Visit: Payer: Medicare Other | Admitting: Adult Health

## 2020-03-28 ENCOUNTER — Ambulatory Visit (INDEPENDENT_AMBULATORY_CARE_PROVIDER_SITE_OTHER): Payer: Medicare Other | Admitting: Adult Health

## 2020-03-28 ENCOUNTER — Encounter: Payer: Self-pay | Admitting: Adult Health

## 2020-03-28 ENCOUNTER — Other Ambulatory Visit: Payer: Self-pay

## 2020-03-28 VITALS — BP 177/75 | HR 53 | Wt 249.0 lb

## 2020-03-28 DIAGNOSIS — G4733 Obstructive sleep apnea (adult) (pediatric): Secondary | ICD-10-CM | POA: Diagnosis not present

## 2020-03-28 DIAGNOSIS — Z9989 Dependence on other enabling machines and devices: Secondary | ICD-10-CM

## 2020-03-28 NOTE — Progress Notes (Addendum)
PATIENT: Roberto Ingram DOB: [birth year]  REASON FOR VISIT: follow up HISTORY FROM: patient  HISTORY OF PRESENT ILLNESS: Today 03/28/20: Mr. Allum is a 69 year old male with a history of obstructive sleep apnea on CPAP.  He reports that he has had a hard time tolerating the CPAP.  He reports that he does not sleep well with the machine.  He has not been using the machine.  His wife feels that he gets claustrophobic with the mask.  Reports that he has tried several different mask but did not see any change.  He comes today to discuss other options.  HISTORY 03/16/2019: Please also see below for virtual visit documentation.  I reviewed his CPAP compliance data from 02/13/2019 through 03/14/2019 which is a total of 30 days, during which time he used his machine 29 days with percent used days greater than 4 hours at 70%, indicating adequate compliance with an average usage of 4 hours and 33 minutes, residual AHI at goal at 1.8 per hour, leak on the higher end with the 95th percentile at 17.5 L/m on a pressure of 6 cm.  REVIEW OF SYSTEMS: Out of a complete 14 system review of symptoms, the patient complains only of the following symptoms, and all other reviewed systems are negative.  Fatigue severity score 38 Epworth sleepiness score 12  ALLERGIES: Allergies  Allergen Reactions  . Diphenhydramine Hcl Other (See Comments)    "The more I take, it makes my prostate swell..."  . Ezetimibe Hives  . Lopressor [Metoprolol] Itching  . Metformin Hives  . Other Itching and Other (See Comments)    FA Dye Prostate swelling  . Sulfa Antibiotics Hives    HOME MEDICATIONS: Outpatient Medications Prior to Visit  Medication Sig Dispense Refill  . albuterol (PROVENTIL HFA;VENTOLIN HFA) 108 (90 Base) MCG/ACT inhaler Inhale 2 puffs into the lungs every 6 (six) hours as needed for wheezing or shortness of breath.     . allopurinol (ZYLOPRIM) 100 MG tablet Take 100 mg by mouth daily as needed (gout).      . ALPRAZolam (XANAX) 0.5 MG tablet Take 0.5 mg by mouth at bedtime as needed for sleep.     Marland Kitchen amLODipine (NORVASC) 10 MG tablet Take 10 mg by mouth daily.    Marland Kitchen aspirin 81 MG chewable tablet Chew 81 mg by mouth daily.     . Budesonide-Formoterol Fumarate (SYMBICORT IN) Inhale 1 puff into the lungs 2 (two) times daily as needed (shortness of breath).    . clopidogrel (PLAVIX) 75 MG tablet Take 75 mg by mouth daily.    . fluticasone (FLONASE) 50 MCG/ACT nasal spray Place 2 sprays into both nostrils daily as needed for allergies or rhinitis.    Marland Kitchen insulin glargine (LANTUS) 100 UNIT/ML injection Inject 66 Units into the skin daily.     . insulin regular (NOVOLIN R,HUMULIN R) 100 units/mL injection Inject 12 to 26 units subcutaneously three times a day before each meal per sliding scale    . levothyroxine (SYNTHROID) 50 MCG tablet Take 50 mcg by mouth daily before breakfast.     . lisinopril (PRINIVIL,ZESTRIL) 40 MG tablet Take 40 mg by mouth daily.     . meclizine (ANTIVERT) 12.5 MG tablet Take 1 tablet (12.5 mg total) by mouth 3 (three) times daily as needed for dizziness. 30 tablet 0  . metoprolol tartrate (LOPRESSOR) 25 MG tablet Take 25 mg by mouth 2 (two) times daily.     . nitroGLYCERIN (NITROSTAT) 0.4 MG  SL tablet Place 0.4 mg under the tongue every 5 (five) minutes as needed for chest pain.     . Omega-3 Fatty Acids (FISH OIL) 1000 MG CAPS Take 1,000 mg by mouth daily.     . rosuvastatin (CRESTOR) 40 MG tablet Take 40 mg by mouth daily.     . tamsulosin (FLOMAX) 0.4 MG CAPS capsule Take 0.4 mg by mouth daily after supper.     . Vitamin D, Ergocalciferol, (DRISDOL) 50000 units CAPS capsule Take 1 tablet by mouth twice each week    . budesonide (PULMICORT) 180 MCG/ACT inhaler Inhale 1 puff into the lungs 2 (two) times daily as needed (for shortness of breath).      No facility-administered medications prior to visit.    PAST MEDICAL HISTORY: Past Medical History:  Diagnosis Date  .  Anemia   . Anxiety    Generalized anxiety disorder  . Asthmatic bronchitis   . Atopic dermatitis   . BPH (benign prostatic hyperplasia)   . Cardiovascular disease   . Carotid disease, bilateral ([place])   . Colon polyp   . Constipation   . Coronary artery disease   . Diabetes mellitus without complication ([place])   . Diabetic retinopathy ([place])   . GERD (gastroesophageal reflux disease)   . Glaucoma of left eye   . Gout   . History of GI bleed   . Hypercholesterolemia   . Hypertension   . Hypomagnesemia   . Hypothyroidism   . Insomnia   . Mixed hyperlipidemia   . Myocardial infarction ([place])   . Renal disorder   . Renal insufficiency   . Sleep apnea    Obstructive sleep apnea, CPAP  . Stroke ([place])   . Thyroid disease   . Vitamin D deficiency     PAST SURGICAL HISTORY: Past Surgical History:  Procedure Laterality Date  . COLONOSCOPY    . COLONOSCOPY WITH PROPOFOL N/A 02/24/2018   Procedure: COLONOSCOPY WITH PROPOFOL;  Surgeon: Toledo, Benay Pike, MD;  Location: [facility] ENDOSCOPY;  Service: Gastroenterology;  Laterality: N/A;  . CORONARY ANGIOPLASTY    . CORONARY ARTERY BYPASS GRAFT    . ESOPHAGOGASTRODUODENOSCOPY    . EXPLORATION POST OPERATIVE OPEN HEART    . EYE SURGERY     Lens eye surgery  . NASAL POLYP EXCISION  1994  . POLYPECTOMY    . Quadruple bypass      FAMILY HISTORY: Family History  Problem Relation Age of Onset  . Atrial fibrillation Mother   . Heart disease Mother   . Stroke Mother   . Bell's palsy Mother   . Kidney disease Mother   . Cancer Mother   . Diabetes Father   . Arthritis Father   . Stroke Brother   . Diabetes Brother   . Lung cancer Brother   . Heart attack Brother   . Cancer Brother   . Heart disease Sister   . COPD Sister   . Throat cancer Sister     SOCIAL HISTORY: Social History   Socioeconomic History  . Marital status: Married    Spouse name: Vivien Rota  . Number of children: 3  . Years of education: 35  . Highest education  level: High school graduate  Occupational History  . Occupation: retired  Tobacco Use  . Smoking status: Former Smoker    Types: Cigarettes    Quit date: 06/11/1993    Years since quitting: 26.8  . Smokeless tobacco: Never Used  Substance and Sexual Activity  .  Alcohol use: No  . Drug use: No  . Sexual activity: Not on file    Comment: Married   Other Topics Concern  . Not on file  Social History Narrative  . Not on file   Social Determinants of Health   Financial Resource Strain:   . Difficulty of Paying Living Expenses:   Food Insecurity:   . Worried About Charity fundraiser in the Last Year:   . Arboriculturist in the Last Year:   Transportation Needs:   . Film/video editor (Medical):   Marland Kitchen Lack of Transportation (Non-Medical):   Physical Activity:   . Days of Exercise per Week:   . Minutes of Exercise per Session:   Stress:   . Feeling of Stress :   Social Connections:   . Frequency of Communication with Friends and Family:   . Frequency of Social Gatherings with Friends and Family:   . Attends Religious Services:   . Active Member of Clubs or Organizations:   . Attends Archivist Meetings:   Marland Kitchen Marital Status:   Intimate Partner Violence:   . Fear of Current or Ex-Partner:   . Emotionally Abused:   Marland Kitchen Physically Abused:   . Sexually Abused:       PHYSICAL EXAM  Vitals:   03/28/20 1423  BP: (!) 177/75  Pulse: (!) 53  Weight: 249 lb (112.9 kg)   Body mass index is 34.73 kg/m.  Generalized: Well developed, in no acute distress  Chest: Lungs clear to auscultation bilaterally  Neurological examination  Mentation: Alert oriented to time, place, history taking. Follows all commands speech and language fluent Cranial nerve II-XII: Extraocular movements were full, visual field were full on confrontational test Head turning and shoulder shrug  were normal and symmetric. Motor: The motor testing reveals 5 over 5 strength of all 4 extremities.  Good symmetric motor tone is noted throughout.  Sensory: Sensory testing is intact to soft touch on all 4 extremities. No evidence of extinction is noted.  Gait and station: Gait is normal.    DIAGNOSTIC DATA (LABS, IMAGING, TESTING) - I reviewed patient records, labs, notes, testing and imaging myself where available.  Lab Results  Component Value Date   WBC 8.2 04/09/2018   HGB 12.4 (L) 04/09/2018   HCT 39.5 04/09/2018   MCV 89.4 04/09/2018   PLT 243 04/09/2018      Component Value Date/Time   NA 140 04/09/2018 0838   K 4.2 04/09/2018 0838   CL 108 04/09/2018 0838   CO2 25 04/09/2018 0838   GLUCOSE 143 (H) 04/09/2018 0838   BUN 30 (H) 04/09/2018 0838   CREATININE 2.07 (H) 04/09/2018 0838   CALCIUM 9.1 04/09/2018 0838   GFRNONAA 31 (L) 04/09/2018 0838   GFRAA 36 (L) 04/09/2018 0838   Lab Results  Component Value Date   CHOL 147 01/24/2017   HDL 41 01/24/2017   LDLCALC 91 01/24/2017   TRIG 74 01/24/2017   CHOLHDL 3.6 01/24/2017   Lab Results  Component Value Date   HGBA1C 10.7 (H) 01/24/2017   No results found for: VITAMINB12 No results found for: TSH    ASSESSMENT AND PLAN 69 y.o. year old male  has a past medical history of Anemia, Anxiety, Asthmatic bronchitis, Atopic dermatitis, BPH (benign prostatic hyperplasia), Cardiovascular disease, Carotid disease, bilateral ([place]), Colon polyp, Constipation, Coronary artery disease, Diabetes mellitus without complication ([place]), Diabetic retinopathy ([place]), GERD (gastroesophageal reflux disease), Glaucoma of left eye, Gout, History  of GI bleed, Hypercholesterolemia, Hypertension, Hypomagnesemia, Hypothyroidism, Insomnia, Mixed hyperlipidemia, Myocardial infarction [facility]), Renal disorder, Renal insufficiency, Sleep apnea, Stroke ([place]), Thyroid disease, and Vitamin D deficiency. here with:  Obstructive sleep apnea    Advised patient that he should try using the mask for 30 minutes during the day to help him get use to it.   Previous compliance download was good.  Advised that if he still cannot get used to the CPAP we could refer him for a dental device  Advised if his symptoms worsen or he develops new symptoms he should let us know    I spent 20 minutes of face-to-face and non-face-to-face time with patient.  This included previsit chart review, lab review, study review, order entry, electronic health record documentation, patient education.  Ward Givens, MSN, NP-C 03/28/2020, 2:35 PM [facility] [address] [place], [place] [address] [phone]  I reviewed the above note and documentation by the Nurse Practitioner and agree with the history, exam, assessment and plan as outlined above. I was available for consultation. Star Age, MD, PhD [facility])

## 2020-03-28 NOTE — Patient Instructions (Signed)
Your Plan:  Try using the CPAP during the day for 30 minutes to help you get used to it.  If that not successful then we will do a dental device If your symptoms worsen or you develop new symptoms please let us know.   Thank you for coming to see [place] at [facility]. I hope we have been able to provide you high quality care today.  You may receive a patient satisfaction survey over the next few weeks. We would appreciate your feedback and comments so that we may continue to improve ourselves and the health of our patients.

## 2020-05-23 ENCOUNTER — Other Ambulatory Visit: Payer: Self-pay | Admitting: Internal Medicine

## 2020-05-23 DIAGNOSIS — R471 Dysarthria and anarthria: Secondary | ICD-10-CM

## 2020-05-24 ENCOUNTER — Other Ambulatory Visit: Payer: Self-pay

## 2020-05-24 ENCOUNTER — Ambulatory Visit
Admission: RE | Admit: 2020-05-24 | Discharge: 2020-05-24 | Disposition: A | Discharge status: Home / Self Care | Payer: Medicare Other | Source: Ambulatory Visit | Attending: Internal Medicine | Admitting: Internal Medicine

## 2020-05-24 DIAGNOSIS — R471 Dysarthria and anarthria: Secondary | ICD-10-CM | POA: Diagnosis present

## 2020-05-24 MED ORDER — GADOBUTROL 1 MMOL/ML IV SOLN
10.0000 mL | Freq: Once | INTRAVENOUS | Status: AC | PRN
  Administered 2020-05-24: 10 mL via INTRAVENOUS

## 2020-06-09 ENCOUNTER — Other Ambulatory Visit: Payer: Self-pay | Admitting: Neurology

## 2020-06-09 DIAGNOSIS — I6302 Cerebral infarction due to thrombosis of basilar artery: Secondary | ICD-10-CM

## 2020-06-12 ENCOUNTER — Other Ambulatory Visit: Payer: Self-pay

## 2020-06-12 ENCOUNTER — Ambulatory Visit
Admission: RE | Admit: 2020-06-12 | Discharge: 2020-06-12 | Disposition: A | Discharge status: Home / Self Care | Payer: Medicare Other | Source: Ambulatory Visit | Attending: Neurology | Admitting: Neurology

## 2020-06-12 DIAGNOSIS — I6302 Cerebral infarction due to thrombosis of basilar artery: Secondary | ICD-10-CM | POA: Insufficient documentation

## 2022-01-01 ENCOUNTER — Other Ambulatory Visit: Payer: Self-pay | Admitting: Nephrology

## 2022-01-01 DIAGNOSIS — N1832 Chronic kidney disease, stage 3b: Secondary | ICD-10-CM

## 2022-01-03 ENCOUNTER — Ambulatory Visit
Admission: RE | Admit: 2022-01-03 | Discharge: 2022-01-03 | Disposition: A | Discharge status: Home / Self Care | Payer: Medicare Other | Source: Ambulatory Visit | Attending: Nephrology | Admitting: Nephrology

## 2022-01-03 DIAGNOSIS — N1832 Chronic kidney disease, stage 3b: Secondary | ICD-10-CM

## 2022-01-21 IMAGING — MR MR HEAD WO/W CM
13 series · 48 of 48 positions shown · IV contrast (gadavist)
Comparison: 04/09/2018

CLINICAL DATA: Dysarthria. Lightheadedness and unsteady gait for 1
week.

EXAM:
MRI HEAD WITHOUT AND WITH CONTRAST
TECHNIQUE: Multiplanar, multiecho pulse sequences of the brain and surrounding
structures were obtained without and with intravenous contrast.
CONTRAST:  10mL GADAVIST GADOBUTROL 1 MMOL/ML IV SOLN

[Series 5: ax dwi_tracew · axial · 3.0mm · 0.60mm/px · z∈[-82,+74]mm · 4 of 48 slices shown]
[im 1/48]
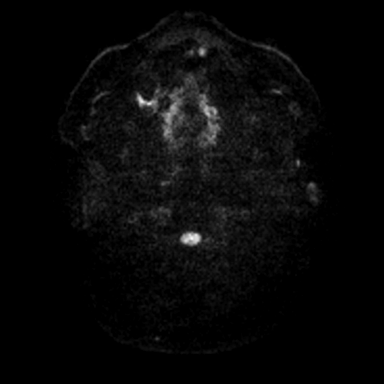
[im 16/48]
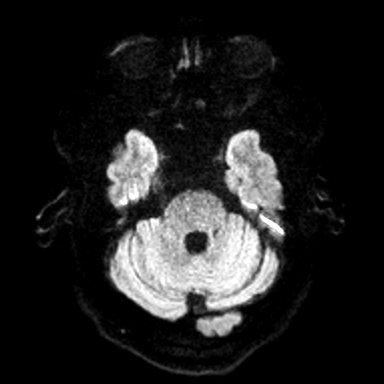
[im 32/48]
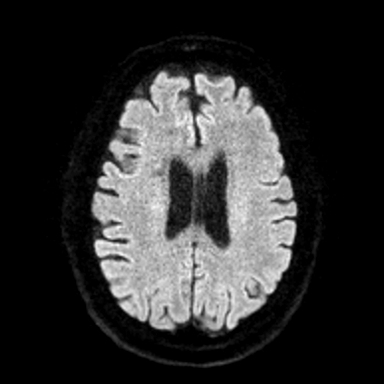
[im 48/48]
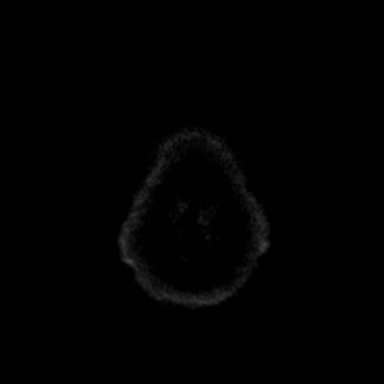

[Series 6: ax dwi_adc · axial · 3.0mm · 0.60mm/px · z∈[-82,+74]mm · 3 of 48 slices shown]
[im 1/48]
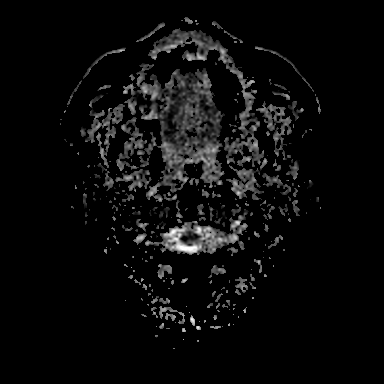
[im 24/48]
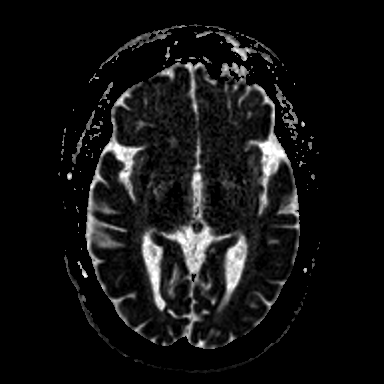
[im 48/48]
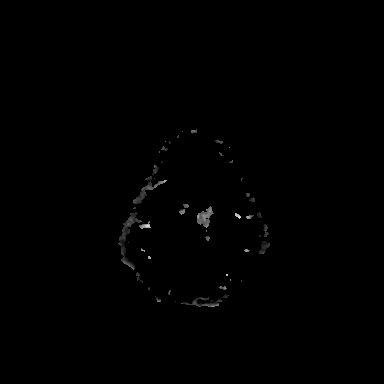

[Series 7: cor dwi_tracew · coronal · 5.0mm · 0.60mm/px · 2 of 40 slices shown]
[im 1/40]
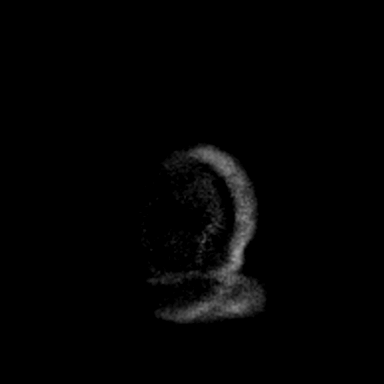
[im 40/40]
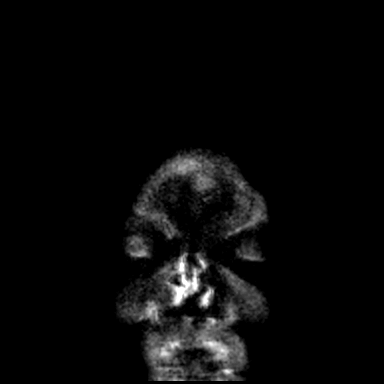

[Series 8: cor dwi_adc · coronal · 5.0mm · 0.60mm/px · 2 of 40 slices shown]
[im 1/40]
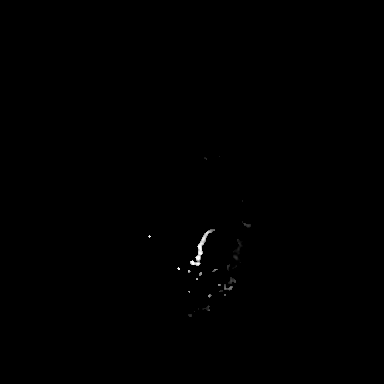
[im 40/40]
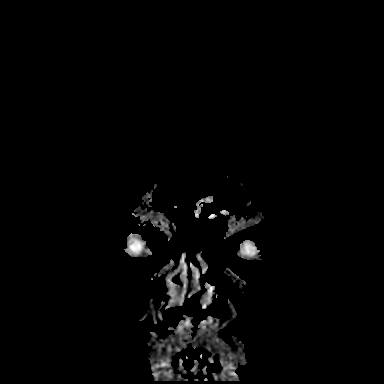

[Series 9: FLAIR · axial · 3.0mm · 0.53mm/px · z∈[-77,+85]mm · 3 of 55 slices shown]
[im 1/55]
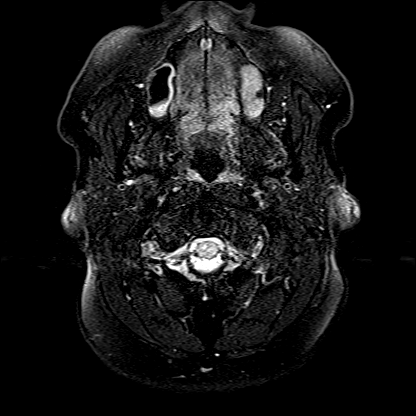
[im 28/55]
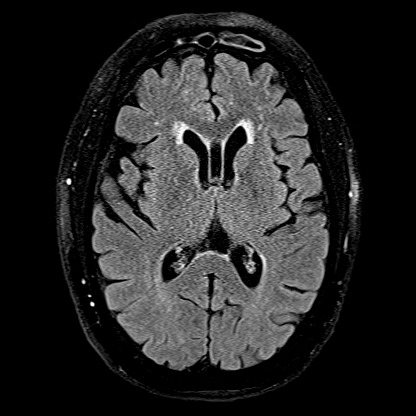
[im 55/55]
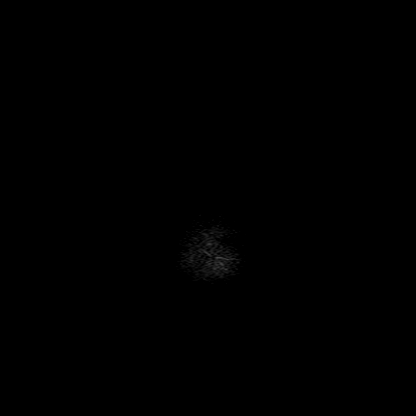

[Series 10: T1 · sagittal · 5.0mm · 0.62mm/px · 1 of 25 slices shown (1 of 2)]
[im 1/25]
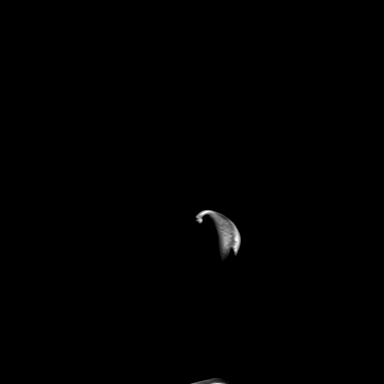

[Series 11: T2 · axial · 5.0mm · 0.53mm/px · 1 of 25 slices shown]
[im 1/25]
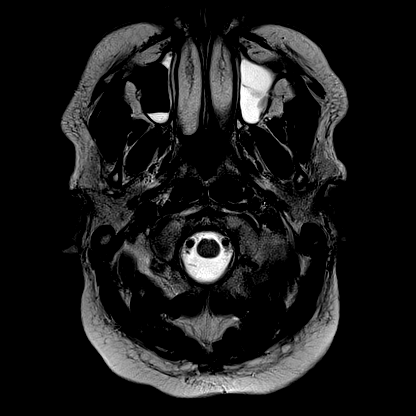

[Series 13: pha_images · axial · 3.0mm · 0.90mm/px · z∈[-85,+89]mm · 4 of 59 slices shown]
[im 1/59]
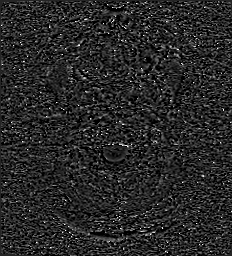
[im 20/59]
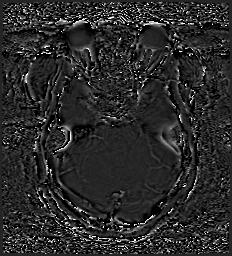
[im 39/59]
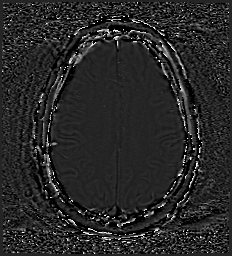
[im 59/59]
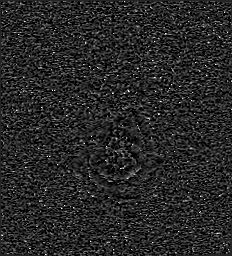

[Series 14: swi_images · axial · 3.0mm · 0.90mm/px · z∈[-85,+92]mm · 4 of 60 slices shown]
[im 1/60]
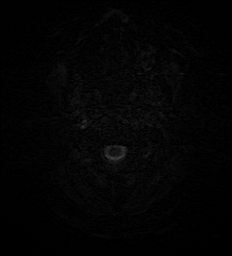
[im 20/60]
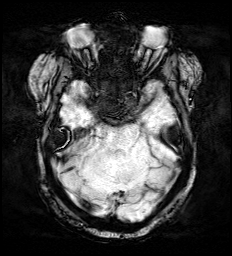
[im 40/60]
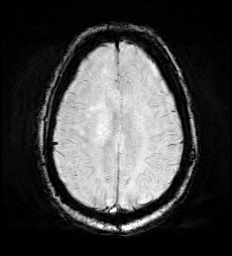
[im 60/60]
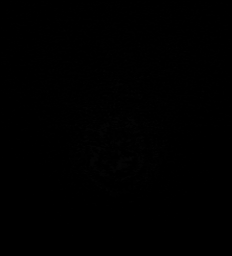

[Series 16: T1 · axial · 1.0mm · 0.98mm/px · z∈[-83,+91]mm · 10 of 175 slices shown (2 of 2)]
[im 1/175]
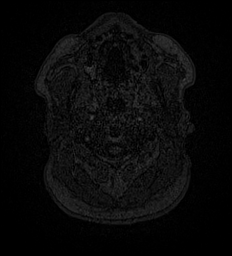
[im 20/175]
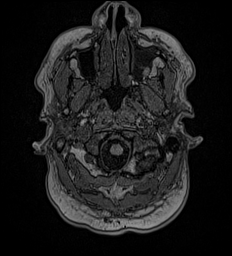
[im 39/175]
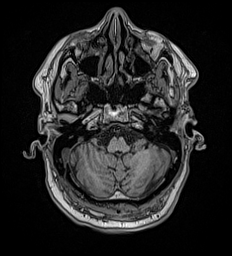
[im 59/175]
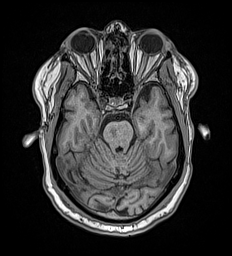
[im 78/175]
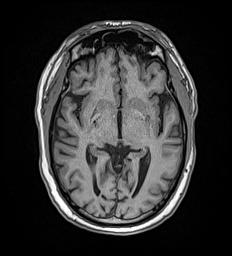
[im 97/175]
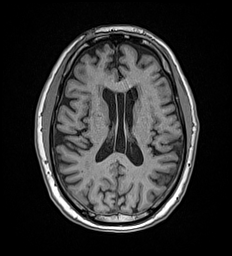
[im 117/175]
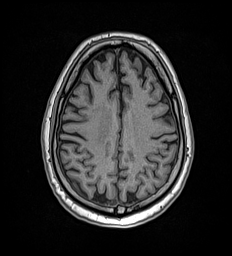
[im 136/175]
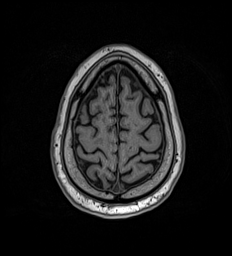
[im 155/175]
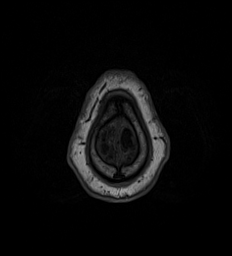
[im 175/175]
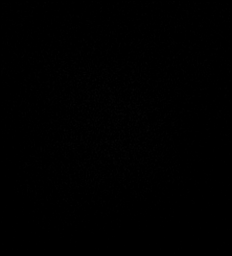

[Series 17: T2 post-contrast · coronal · 5.0mm · 0.57mm/px · 2 of 29 slices shown]
[im 1/29]
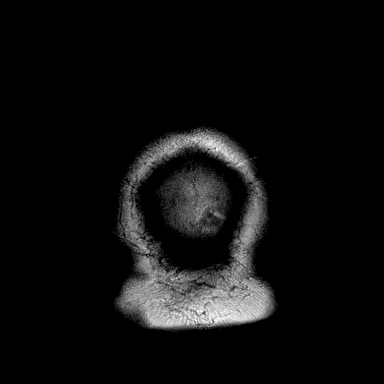
[im 29/29]
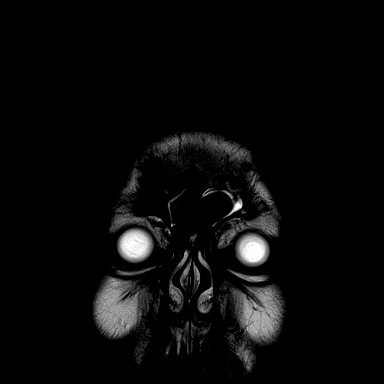

[Series 18: T1 post-contrast · axial · 1.0mm · 0.98mm/px · z∈[-83,+91]mm · 10 of 176 slices shown (1 of 2)]
[im 1/176]
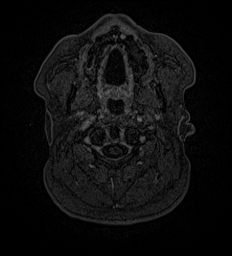
[im 20/176]
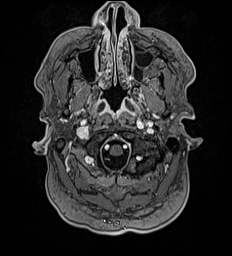
[im 39/176]
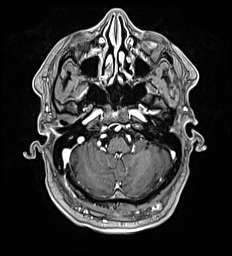
[im 59/176]
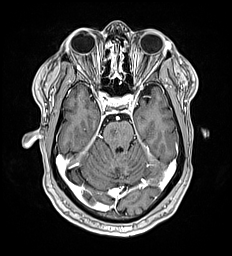
[im 78/176]
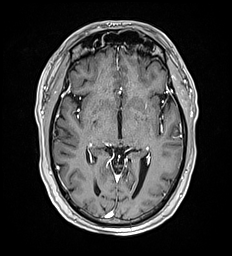
[im 98/176]
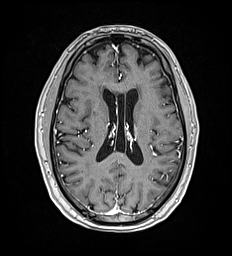
[im 117/176]
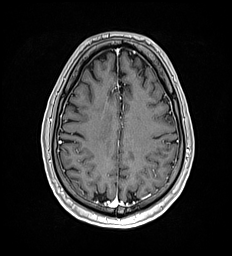
[im 137/176]
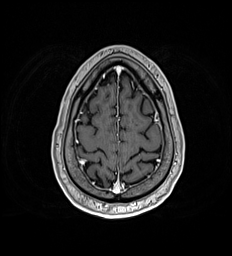
[im 156/176]
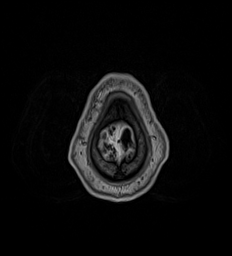
[im 176/176]
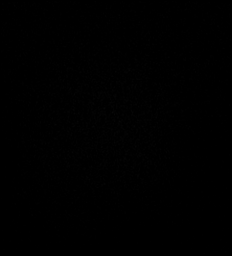

[Series 19: T1 post-contrast · coronal · 5.0mm · 0.57mm/px · 2 of 29 slices shown (2 of 2)]
[im 1/29]
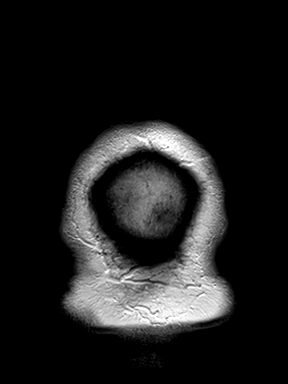
[im 29/29]
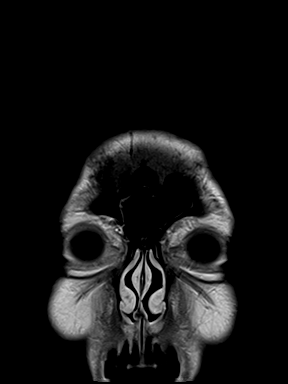

[48 of 48 positions shown; findings below may reference images not displayed]

FINDINGS: Brain: There is a 1 cm acute to early subacute right paracentral
pontine infarct with perhaps faint associated enhancement. This is
located immediately superior to a chronic infarct. Patchy T2
hyperintensities in the cerebral white matter bilaterally are
similar to the prior MRI and are nonspecific but compatible with
moderate chronic small vessel ischemic disease. Chronic lacunar
infarcts are again seen in the right frontal white matter. There are
a few scattered chronic cerebral microhemorrhages. There is mild
cerebral atrophy. A cavum septum pellucidum et vergae is noted.

Vascular: Major intracranial vascular flow voids are preserved.

Skull and upper cervical spine: Unremarkable bone marrow signal.

Sinuses/Orbits: Bilateral cataract extraction. Prior sinus surgery.
Mild scattered mucosal thickening in the paranasal sinuses with a
mucous retention cyst noted in the left maxillary sinus. Clear
mastoid air cells.

Other: None.
IMPRESSION: 1. Acute to early subacute right pontine infarct.
2. Moderate chronic small vessel ischemic disease.

These results will be called to the ordering clinician or
representative by the [HOSPITAL] at the imaging location.

## 2022-03-08 ENCOUNTER — Emergency Department
Admission: EM | Admit: 2022-03-08 | Discharge: 2022-03-08 | Disposition: A | Discharge status: Home / Self Care | Payer: Medicare Other | Attending: Emergency Medicine | Admitting: Emergency Medicine

## 2022-03-08 ENCOUNTER — Emergency Department: Payer: Medicare Other

## 2022-03-08 ENCOUNTER — Other Ambulatory Visit: Payer: Self-pay

## 2022-03-08 DIAGNOSIS — I2510 Atherosclerotic heart disease of native coronary artery without angina pectoris: Secondary | ICD-10-CM | POA: Insufficient documentation

## 2022-03-08 DIAGNOSIS — N189 Chronic kidney disease, unspecified: Secondary | ICD-10-CM | POA: Diagnosis not present

## 2022-03-08 DIAGNOSIS — R0789 Other chest pain: Secondary | ICD-10-CM | POA: Insufficient documentation

## 2022-03-08 DIAGNOSIS — R079 Chest pain, unspecified: Secondary | ICD-10-CM

## 2022-03-08 DIAGNOSIS — E1122 Type 2 diabetes mellitus with diabetic chronic kidney disease: Secondary | ICD-10-CM | POA: Diagnosis not present

## 2022-03-08 LAB — TROPONIN I (HIGH SENSITIVITY)
Troponin I (High Sensitivity): 7 ng/L (ref <18)
Troponin I (High Sensitivity): 6 ng/L (ref <18)

## 2022-03-08 LAB — CBC
HCT: 32.8 % — ABNORMAL LOW (ref 39.0–52.0)
Hemoglobin: 10.1 g/dL — ABNORMAL LOW (ref 13.0–17.0)
MCH: 28.5 pg (ref 26.0–34.0)
MCHC: 30.8 g/dL (ref 30.0–36.0)
MCV: 92.4 fL (ref 80.0–100.0)
Platelets: 270 10*3/uL (ref 150–400)
RBC: 3.55 MIL/uL — ABNORMAL LOW (ref 4.22–5.81)
RDW: 14.2 % (ref 11.5–15.5)
WBC: 8 10*3/uL (ref 4.0–10.5)
nRBC: 0 % (ref 0.0–0.2)

## 2022-03-08 LAB — BASIC METABOLIC PANEL
Anion gap: 7 (ref 5–15)
BUN: 33 mg/dL — ABNORMAL HIGH (ref 8–23)
CO2: 24 mmol/L (ref 22–32)
Calcium: 8.4 mg/dL — ABNORMAL LOW (ref 8.9–10.3)
Chloride: 109 mmol/L (ref 98–111)
Creatinine, Ser: 2.38 mg/dL — ABNORMAL HIGH (ref 0.61–1.24)
GFR, Estimated: 28 mL/min — ABNORMAL LOW (ref >60)
Glucose, Bld: 169 mg/dL — ABNORMAL HIGH (ref 70–99)
Potassium: 4.2 mmol/L (ref 3.5–5.1)
Sodium: 140 mmol/L (ref 135–145)

## 2022-03-08 MED ORDER — LIDOCAINE VISCOUS HCL 2 % MT SOLN
15.0000 mL | Freq: Once | OROMUCOSAL | Status: AC
  Administered 2022-03-08: 15 mL via ORAL
  Filled 2022-03-08: qty 15

## 2022-03-08 MED ORDER — ALUM & MAG HYDROXIDE-SIMETH 200-200-20 MG/5ML PO SUSP
30.0000 mL | Freq: Once | ORAL | Status: AC
  Administered 2022-03-08: 30 mL via ORAL
  Filled 2022-03-08: qty 30

## 2022-03-08 NOTE — ED Provider Notes (Signed)
? ?[facility] ?Provider Note ? ? ? Event Date/Time  ? First MD Initiated Contact with Patient 03/08/22 0725   ?  (approximate) ? ? ?History  ? ?Chest Pain ? ? ?HPI ? ?Roberto Ingram is a 71 y.o. male with a history of chronic kidney disease, CAD, diabetes who presents with complaints of chest pain.  Patient reports last night he was watching the game, was eating hotdogs during this time.  He went to sleep around 10:00, woke up couple hours later and decided to read watch the game.  Around 1 AM he reports that he had discomfort in the left side of his chest.  His wife reports he has had this before and it was related to gastritis. ?  ? ? ?Physical Exam  ? ?Triage Vital Signs: ?ED Triage Vitals  ?Enc Vitals Group  ?   BP 03/08/22 0723 138/60  ?   Pulse Rate 03/08/22 0723 65  ?   Resp 03/08/22 0723 17  ?   Temp 03/08/22 0723 97.9 ?F (36.6 ?C)  ?   Temp Source 03/08/22 0723 Oral  ?   SpO2 03/08/22 0723 97 %  ?   Weight 03/08/22 0721 100.7 kg (222 lb)  ?   Height 03/08/22 0721 1.803 m ('5\' 11"'$ )  ?   Head Circumference --   ?   Peak Flow --   ?   Pain Score 03/08/22 0721 9  ?   Pain Loc --   ?   Pain Edu? --   ?   Excl. in [place]? --   ? ? ?Most recent vital signs: ?Vitals:  ? 03/08/22 0930 03/08/22 1000  ?BP: 122/66 123/72  ?Pulse: (!) 56 63  ?Resp: 15 (!) 35  ?Temp:    ?SpO2: 100% 100%  ? ? ? ?General: Awake, no distress.  ?CV:  Good peripheral perfusion.  ?Resp:  Normal effort.  ?Abd:  No distention.  ?Other:  No lower extremity swelling or edema ? ? ?ED Results / Procedures / Treatments  ? ?Labs ?(all labs ordered are listed, but only abnormal results are displayed) ?Labs Reviewed  ?BASIC METABOLIC PANEL - Abnormal; Notable for the following components:  ?    Result Value  ? Glucose, Bld 169 (*)   ? BUN 33 (*)   ? Creatinine, Ser 2.38 (*)   ? Calcium 8.4 (*)   ? GFR, Estimated 28 (*)   ? All other components within normal limits  ?CBC - Abnormal; Notable for the following components:  ? RBC 3.55 (*)    ? Hemoglobin 10.1 (*)   ? HCT 32.8 (*)   ? All other components within normal limits  ?TROPONIN I (HIGH SENSITIVITY)  ?TROPONIN I (HIGH SENSITIVITY)  ? ? ? ?EKG ? ?ED ECG REPORT ?I, Lavonia Drafts, the attending physician, personally viewed and interpreted this ECG. ? ?Date: 03/08/2022 ? ?Rhythm: normal sinus rhythm ?QRS Axis: normal ?Intervals: normal ?ST/T Wave abnormalities: Nonspecific changes ? ? ? ? ?RADIOLOGY ?Chest x-ray viewed interpreted by me, no acute abnormality ? ? ? ?PROCEDURES: ? ?Critical Care performed:  ? ?Procedures ? ? ?MEDICATIONS ORDERED IN ED: ?Medications  ?alum & mag hydroxide-simeth (MAALOX/MYLANTA) 200-200-20 MG/5ML suspension 30 mL (30 mLs Oral Given 03/08/22 0855)  ?  And  ?lidocaine (XYLOCAINE) 2 % viscous mouth solution 15 mL (15 mLs Oral Given 03/08/22 0855)  ? ? ? ?IMPRESSION / MDM / ASSESSMENT AND PLAN / ED COURSE  ?I reviewed the triage vital signs  and the nursing notes. ? ?Patient with CKD, diabetes, known CAD presents with left-sided chest discomfort.  EKG is not significantly changed from prior.  He reports his pain is improving. ? ?Chest x-ray is unremarkable, no evidence of pneumonia or pneumothorax or edema ? ?High sensitive troponin is normal.  We will perform delta.  Will trial GI cocktail as well. ? ?Reviewed patient's records with his cardiologist, recently seen for second opinion on April 14 with Dr. Saralyn Pilar ?----------------------------------------- ?10:51 AM on 03/08/2022 ?----------------------------------------- ?Second troponin is normal.  Patient reports he is feeling much better, I offered admission for observation however he declined, he wants to go home and take a nap and states that he will be as good is new. ? ?I discussed this with him and his wife at length and via shared decision making the patient is opted for discharge he knows to return at any time if any change in his symptoms.  He will follow-up with his cardiologist closely. ? ? ? ?  ? ? ?FINAL  CLINICAL IMPRESSION(S) / ED DIAGNOSES  ? ?Final diagnoses:  ?Nonspecific chest pain  ? ? ? ?Rx / DC Orders  ? ?ED Discharge Orders   ? ? None  ? ?  ? ? ? ?Note:  This document was prepared using Dragon voice recognition software and may include unintentional dictation errors. ?  ?Lavonia Drafts, MD ?03/08/22 1052 ? ?

## 2022-03-08 NOTE — ED Notes (Signed)
Patient transported to X-ray 

## 2022-03-08 NOTE — ED Notes (Signed)
Pt A&O, pt given discharge instructions, pt able to ambulate with steady gait. ?

## 2022-03-08 NOTE — ED Triage Notes (Signed)
Pt c/o burning left sided chest pain with some SOB and nausea since 2 am. Pt is ambulatory to triage with a steady gait, no distess noted on arrival, pt took '324mg'$  ASA and 1 nitro SL PTA ?

## 2022-07-05 ENCOUNTER — Other Ambulatory Visit: Payer: Self-pay | Admitting: Student

## 2022-07-05 ENCOUNTER — Other Ambulatory Visit (HOSPITAL_COMMUNITY): Payer: Self-pay | Admitting: Student

## 2022-07-05 DIAGNOSIS — I6930 Unspecified sequelae of cerebral infarction: Secondary | ICD-10-CM

## 2022-07-19 ENCOUNTER — Ambulatory Visit
Admission: RE | Admit: 2022-07-19 | Discharge: 2022-07-19 | Disposition: A | Discharge status: Home / Self Care | Payer: Medicare Other | Source: Ambulatory Visit | Attending: Student | Admitting: Student

## 2022-07-19 DIAGNOSIS — I6930 Unspecified sequelae of cerebral infarction: Secondary | ICD-10-CM

## 2022-08-06 ENCOUNTER — Ambulatory Visit: Payer: Medicare Other | Admitting: Anesthesiology

## 2022-08-06 ENCOUNTER — Encounter
Admission: RE | Disposition: A | Discharge status: Home / Self Care | Payer: Self-pay | Source: Home / Self Care | Attending: Gastroenterology

## 2022-08-06 ENCOUNTER — Ambulatory Visit
Admission: RE | Admit: 2022-08-06 | Discharge: 2022-08-06 | Disposition: A | Discharge status: Home / Self Care | Payer: Medicare Other | Attending: Gastroenterology | Admitting: Gastroenterology

## 2022-08-06 ENCOUNTER — Encounter: Payer: Self-pay | Admitting: Internal Medicine

## 2022-08-06 DIAGNOSIS — D123 Benign neoplasm of transverse colon: Secondary | ICD-10-CM | POA: Insufficient documentation

## 2022-08-06 DIAGNOSIS — Z87891 Personal history of nicotine dependence: Secondary | ICD-10-CM | POA: Diagnosis not present

## 2022-08-06 DIAGNOSIS — I2510 Atherosclerotic heart disease of native coronary artery without angina pectoris: Secondary | ICD-10-CM | POA: Diagnosis not present

## 2022-08-06 DIAGNOSIS — I129 Hypertensive chronic kidney disease with stage 1 through stage 4 chronic kidney disease, or unspecified chronic kidney disease: Secondary | ICD-10-CM | POA: Insufficient documentation

## 2022-08-06 DIAGNOSIS — Z7902 Long term (current) use of antithrombotics/antiplatelets: Secondary | ICD-10-CM | POA: Insufficient documentation

## 2022-08-06 DIAGNOSIS — E039 Hypothyroidism, unspecified: Secondary | ICD-10-CM | POA: Insufficient documentation

## 2022-08-06 DIAGNOSIS — Z951 Presence of aortocoronary bypass graft: Secondary | ICD-10-CM | POA: Insufficient documentation

## 2022-08-06 DIAGNOSIS — N184 Chronic kidney disease, stage 4 (severe): Secondary | ICD-10-CM | POA: Diagnosis not present

## 2022-08-06 DIAGNOSIS — Z955 Presence of coronary angioplasty implant and graft: Secondary | ICD-10-CM | POA: Insufficient documentation

## 2022-08-06 DIAGNOSIS — G4730 Sleep apnea, unspecified: Secondary | ICD-10-CM | POA: Insufficient documentation

## 2022-08-06 DIAGNOSIS — Z8673 Personal history of transient ischemic attack (TIA), and cerebral infarction without residual deficits: Secondary | ICD-10-CM | POA: Diagnosis not present

## 2022-08-06 DIAGNOSIS — F419 Anxiety disorder, unspecified: Secondary | ICD-10-CM | POA: Diagnosis not present

## 2022-08-06 DIAGNOSIS — Z85030 Personal history of malignant carcinoid tumor of large intestine: Secondary | ICD-10-CM | POA: Insufficient documentation

## 2022-08-06 DIAGNOSIS — E11319 Type 2 diabetes mellitus with unspecified diabetic retinopathy without macular edema: Secondary | ICD-10-CM | POA: Insufficient documentation

## 2022-08-06 DIAGNOSIS — I252 Old myocardial infarction: Secondary | ICD-10-CM | POA: Insufficient documentation

## 2022-08-06 DIAGNOSIS — Z1211 Encounter for screening for malignant neoplasm of colon: Secondary | ICD-10-CM | POA: Diagnosis present

## 2022-08-06 DIAGNOSIS — E1122 Type 2 diabetes mellitus with diabetic chronic kidney disease: Secondary | ICD-10-CM | POA: Diagnosis not present

## 2022-08-06 HISTORY — PX: COLONOSCOPY WITH PROPOFOL: SHX5780

## 2022-08-06 LAB — GLUCOSE, CAPILLARY: Glucose-Capillary: 149 mg/dL — ABNORMAL HIGH (ref 70–99)

## 2022-08-06 SURGERY — COLONOSCOPY WITH PROPOFOL
Anesthesia: General

## 2022-08-06 MED ORDER — LIDOCAINE HCL (CARDIAC) PF 100 MG/5ML IV SOSY
PREFILLED_SYRINGE | INTRAVENOUS | Status: DC | PRN
  Administered 2022-08-06: 100 mg via INTRAVENOUS

## 2022-08-06 MED ORDER — LIDOCAINE HCL (PF) 2 % IJ SOLN
INTRAMUSCULAR | Status: AC
  Filled 2022-08-06: qty 5

## 2022-08-06 MED ORDER — FENTANYL CITRATE (PF) 100 MCG/2ML IJ SOLN
INTRAMUSCULAR | Status: AC
  Filled 2022-08-06: qty 2

## 2022-08-06 MED ORDER — SUCCINYLCHOLINE CHLORIDE 200 MG/10ML IV SOSY
PREFILLED_SYRINGE | INTRAVENOUS | Status: AC
  Filled 2022-08-06: qty 10

## 2022-08-06 MED ORDER — LIDOCAINE HCL URETHRAL/MUCOSAL 2 % EX GEL
CUTANEOUS | Status: AC
  Filled 2022-08-06: qty 6

## 2022-08-06 MED ORDER — PROPOFOL 1000 MG/100ML IV EMUL
INTRAVENOUS | Status: AC
  Filled 2022-08-06: qty 100

## 2022-08-06 MED ORDER — EPHEDRINE 5 MG/ML INJ
INTRAVENOUS | Status: AC
  Filled 2022-08-06: qty 5

## 2022-08-06 MED ORDER — FENTANYL CITRATE (PF) 100 MCG/2ML IJ SOLN
INTRAMUSCULAR | Status: DC | PRN
  Administered 2022-08-06: 50 ug via INTRAVENOUS

## 2022-08-06 MED ORDER — ROCURONIUM BROMIDE 10 MG/ML (PF) SYRINGE
PREFILLED_SYRINGE | INTRAVENOUS | Status: AC
  Filled 2022-08-06: qty 10

## 2022-08-06 MED ORDER — PROPOFOL 500 MG/50ML IV EMUL
INTRAVENOUS | Status: DC | PRN
  Administered 2022-08-06: 150 ug/kg/min via INTRAVENOUS

## 2022-08-06 MED ORDER — SUCCINYLCHOLINE CHLORIDE 200 MG/10ML IV SOSY
PREFILLED_SYRINGE | INTRAVENOUS | Status: DC | PRN
  Administered 2022-08-06: 120 mg via INTRAVENOUS

## 2022-08-06 MED ORDER — EPHEDRINE SULFATE (PRESSORS) 50 MG/ML IJ SOLN
INTRAMUSCULAR | Status: DC | PRN
  Administered 2022-08-06 (×2): 10 mg via INTRAVENOUS

## 2022-08-06 MED ORDER — ROCURONIUM BROMIDE 100 MG/10ML IV SOLN
INTRAVENOUS | Status: DC | PRN
  Administered 2022-08-06: 5 mg via INTRAVENOUS

## 2022-08-06 MED ORDER — SODIUM CHLORIDE 0.9 % IV SOLN: INTRAVENOUS | Status: DC

## 2022-08-06 NOTE — Op Note (Signed)
[facility] Patient Name: Roberto Ingram Procedure Date: 08/06/2022 10:06 AM MRN: [id number] Account #: [id number] Date of Birth: 12-03-1950 Admit Type: Outpatient Age: 71 Room: [facility] ENDO ROOM 1 Gender: Male Note Status: Finalized Instrument Name: Park Meo [id number] Procedure:             Colonoscopy Indications:           Malignant carcinoid tumor of the rectum Providers:             Andrey Farmer MD, MD Referring MD:          No Local Md, MD (Referring MD) Medicines:             Monitored Anesthesia Care Complications:         No immediate complications. Estimated blood loss:                         Minimal. Procedure:             Pre-Anesthesia Assessment:                        - Prior to the procedure, a History and Physical was                         performed, and patient medications and allergies were                         reviewed. The patient is competent. The risks and                         benefits of the procedure and the sedation options and                         risks were discussed with the patient. All questions                         were answered and informed consent was obtained.                         Patient identification and proposed procedure were                         verified by the physician, the nurse, the                         anesthesiologist, the anesthetist and the technician                         in the endoscopy suite. Mental Status Examination:                         alert and oriented. Airway Examination: normal                         oropharyngeal airway and neck mobility. Respiratory                         Examination: clear to auscultation. CV Examination:  normal. Prophylactic Antibiotics: The patient does not                         require prophylactic antibiotics. Prior                         Anticoagulants: The patient has taken Plavix                          (clopidogrel), last dose was 5 days prior to                         procedure. ASA Grade Assessment: III - A patient with                         severe systemic disease. After reviewing the risks and                         benefits, the patient was deemed in satisfactory                         condition to undergo the procedure. The anesthesia                         plan was to use monitored anesthesia care (MAC).                         Immediately prior to administration of medications,                         the patient was re-assessed for adequacy to receive                         sedatives. The heart rate, respiratory rate, oxygen                         saturations, blood pressure, adequacy of pulmonary                         ventilation, and response to care were monitored                         throughout the procedure. The physical status of the                         patient was re-assessed after the procedure.                        After obtaining informed consent, the colonoscope was                         passed under direct vision. Throughout the procedure,                         the patient's blood pressure, pulse, and oxygen                         saturations were monitored continuously. The  Colonoscope was introduced through the anus and                         advanced to the the cecum, identified by appendiceal                         orifice and ileocecal valve. The colonoscopy was                         performed without difficulty. The patient tolerated                         the procedure well. The quality of the bowel                         preparation was good. Findings:      The perianal and digital rectal examinations were normal.      A 1 mm polyp was found in the cecum. The polyp was sessile. The polyp       was removed with a jumbo cold forceps. Resection and retrieval were       complete. Estimated blood loss was  minimal.      A 2 mm polyp was found in the transverse colon. The polyp was sessile.       The polyp was removed with a jumbo cold forceps. Resection and retrieval       were complete. Estimated blood loss was minimal.      The exam was otherwise without abnormality on direct and retroflexion       views. Impression:            - One 1 mm polyp in the cecum, removed with a jumbo                         cold forceps. Resected and retrieved.                        - One 2 mm polyp in the transverse colon, removed with                         a jumbo cold forceps. Resected and retrieved.                        - The examination was otherwise normal on direct and                         retroflexion views. Recommendation:        - Discharge patient to home.                        - Resume previous diet.                        - Continue present medications.                        - Await pathology results.                        - Repeat colonoscopy for surveillance based on  pathology results.                        - Return to referring physician as previously                         scheduled. Procedure Code(s):     --- Professional ---                        [phone], Colonoscopy, flexible; with biopsy, single or                         multiple Diagnosis Code(s):     --- Professional ---                        K63.5, Polyp of colon                        C7A.026, Malignant carcinoid tumor of the rectum CPT copyright 2019 [employer]. All rights reserved. The codes documented in this report are preliminary and upon coder review may  be revised to meet current compliance requirements. Andrey Farmer MD, MD 08/06/2022 10:50:20 AM Number of Addenda: 0 Note Initiated On: 08/06/2022 10:06 AM Scope Withdrawal Time: 0 hours 12 minutes 35 seconds  Total Procedure Duration: 0 hours 16 minutes 34 seconds  Estimated Blood Loss:  Estimated blood loss was  minimal.      [facility]

## 2022-08-06 NOTE — Interval H&P Note (Signed)
History and Physical Interval Note:  08/06/2022 11:02 AM  Roberto Ingram  has presented today for surgery, with the diagnosis of History of adenomatous colonic polyps Z86.010 Benign carcinoid tumor of the rectum D3A.026.  The various methods of treatment have been discussed with the patient and family. After consideration of risks, benefits and other options for treatment, the patient has consented to  Procedure(s): COLONOSCOPY WITH PROPOFOL (N/A) as a surgical intervention.  The patient's history has been reviewed, patient examined, no change in status, stable for surgery.  I have reviewed the patient's chart and labs.  Questions were answered to the patient's satisfaction.     Lesly Rubenstein  Ok to proceed with colonoscopy

## 2022-08-06 NOTE — Anesthesia Postprocedure Evaluation (Signed)
Anesthesia Post Note  Patient: Roberto Ingram  Procedure(s) Performed: COLONOSCOPY WITH PROPOFOL  Patient location during evaluation: PACU Anesthesia Type: General Level of consciousness: awake and alert, oriented and patient cooperative Pain management: pain level controlled Vital Signs Assessment: post-procedure vital signs reviewed and stable Respiratory status: spontaneous breathing, nonlabored ventilation and respiratory function stable Cardiovascular status: blood pressure returned to baseline and stable Postop Assessment: adequate PO intake Anesthetic complications: no   No notable events documented.   Last Vitals:  Vitals:   08/06/22 1102 08/06/22 1122  BP:  104/70  Pulse:    Resp:    Temp:    SpO2: 95% 95%    Last Pain:  Vitals:   08/06/22 1122  TempSrc:   PainSc: 0-No pain                 Darrin Nipper

## 2022-08-06 NOTE — H&P (Signed)
Outpatient short stay form Pre-procedure 08/06/2022  Lesly Rubenstein, MD  Primary Physician: Renee Rival, NP  Reason for visit:  History of NET  History of present illness:    71 y/o gentleman with history of hypertension, CVA on plavix with last dose 5 days ago, and DM II here for surveillance colonoscopy. Last colonoscopy in 2019 with Ta's and NET. Takes ozempic. No significant abdominal surgeries. No family history of GI malignancies.    Current Facility-Administered Medications:    0.9 %  sodium chloride infusion, , Intravenous, Continuous, Brendin Situ, Hilton Cork, MD, Last Rate: 400 mL/hr at 08/06/22 1042, Rate Change at 08/06/22 1042  Medications Prior to Admission  Medication Sig Dispense Refill Last Dose   ALPRAZolam (XANAX) 0.5 MG tablet Take 0.5 mg by mouth at bedtime as needed for sleep.    08/06/2022   amLODipine (NORVASC) 10 MG tablet Take 10 mg by mouth daily.   08/06/2022   lisinopril (PRINIVIL,ZESTRIL) 40 MG tablet Take 40 mg by mouth daily.    08/06/2022   metoprolol tartrate (LOPRESSOR) 25 MG tablet Take 25 mg by mouth 2 (two) times daily.    08/06/2022   albuterol (PROVENTIL HFA;VENTOLIN HFA) 108 (90 Base) MCG/ACT inhaler Inhale 2 puffs into the lungs every 6 (six) hours as needed for wheezing or shortness of breath.       allopurinol (ZYLOPRIM) 100 MG tablet Take 100 mg by mouth daily as needed (gout).       aspirin 81 MG chewable tablet Chew 81 mg by mouth daily.    08/01/2022   Budesonide-Formoterol Fumarate (SYMBICORT IN) Inhale 1 puff into the lungs 2 (two) times daily as needed (shortness of breath).      clopidogrel (PLAVIX) 75 MG tablet Take 75 mg by mouth daily.   08/01/2022   fluticasone (FLONASE) 50 MCG/ACT nasal spray Place 2 sprays into both nostrils daily as needed for allergies or rhinitis.      insulin glargine (LANTUS) 100 UNIT/ML injection Inject 66 Units into the skin daily.       insulin regular (NOVOLIN R,HUMULIN R) 100 units/mL injection Inject 12  to 26 units subcutaneously three times a day before each meal per sliding scale      levothyroxine (SYNTHROID) 50 MCG tablet Take 50 mcg by mouth daily before breakfast.       meclizine (ANTIVERT) 12.5 MG tablet Take 1 tablet (12.5 mg total) by mouth 3 (three) times daily as needed for dizziness. 30 tablet 0    nitroGLYCERIN (NITROSTAT) 0.4 MG SL tablet Place 0.4 mg under the tongue every 5 (five) minutes as needed for chest pain.       Omega-3 Fatty Acids (FISH OIL) 1000 MG CAPS Take 1,000 mg by mouth daily.       rosuvastatin (CRESTOR) 40 MG tablet Take 40 mg by mouth daily.       tamsulosin (FLOMAX) 0.4 MG CAPS capsule Take 0.4 mg by mouth daily after supper.       Vitamin D, Ergocalciferol, (DRISDOL) 50000 units CAPS capsule Take 1 tablet by mouth twice each week        Allergies  Allergen Reactions   Diphenhydramine Hcl Other (See Comments)    "The more I take, it makes my prostate swell..."   Ezetimibe Hives   Lopressor [Metoprolol] Itching   Metformin Hives   Other Itching and Other (See Comments)    FA Dye Prostate swelling   Sulfa Antibiotics Hives     Past Medical History:  Diagnosis Date   Anemia    Anxiety    Generalized anxiety disorder   Asthmatic bronchitis    Atopic dermatitis    BPH (benign prostatic hyperplasia)    Cardiovascular disease    Carotid disease, bilateral (HCC)    Colon polyp    Constipation    Coronary artery disease    Diabetes mellitus without complication (HCC)    Diabetic retinopathy (HCC)    GERD (gastroesophageal reflux disease)    Glaucoma of left eye    Gout    History of GI bleed    Hypercholesterolemia    Hypertension    Hypomagnesemia    Hypothyroidism    Insomnia    Mixed hyperlipidemia    Myocardial infarction [facility])    Renal disorder    Renal insufficiency    Sleep apnea    Obstructive sleep apnea, CPAP   Stroke ([place])    Thyroid disease    Vitamin D deficiency     Review of systems:  Otherwise negative.     Physical Exam  Gen: Alert, oriented. Appears stated age.  HEENT: PERRLA. Lungs: No respiratory distress CV: RRR Abd: soft, benign, no masses Ext: No edema    Planned procedures: Proceed with EGD. The patient understands the nature of the planned procedure, indications, risks, alternatives and potential complications including but not limited to bleeding, infection, perforation, damage to internal organs and possible oversedation/side effects from anesthesia. The patient agrees and gives consent to proceed.  Please refer to procedure notes for findings, recommendations and patient disposition/instructions.     Lesly Rubenstein, MD [facility]

## 2022-08-06 NOTE — Anesthesia Procedure Notes (Signed)
Procedure Name: Intubation Date/Time: 08/06/2022 10:30 AM  Performed by: Donalda Ewings, CindyPre-anesthesia Checklist: Patient identified, Emergency Drugs available, Suction available, Patient being monitored and Timeout performed Oxygen Delivery Method: Circle system utilized Preoxygenation: Pre-oxygenation with 100% oxygen Induction Type: IV induction Ventilation: Mask ventilation without difficulty Laryngoscope Size: Miller and 3 Grade View: Grade II Tube type: Oral Tube size: 7.5 mm Number of attempts: 1 Airway Equipment and Method: Stylet Placement Confirmation: ETT inserted through vocal cords under direct vision, positive ETCO2, CO2 detector and breath sounds checked- equal and bilateral Secured at: 22 cm Tube secured with: Tape Dental Injury: Teeth and Oropharynx as per pre-operative assessment

## 2022-08-06 NOTE — Transfer of Care (Signed)
Immediate Anesthesia Transfer of Care Note  Patient: Roberto Ingram  Procedure(s) Performed: COLONOSCOPY WITH PROPOFOL  Patient Location: PACU  Anesthesia Type:General  Level of Consciousness: awake and sedated  Airway & Oxygen Therapy: Patient Spontanous Breathing and Patient connected to nasal cannula oxygen  Post-op Assessment: Report given to RN and Post -op Vital signs reviewed and stable  Post vital signs: Reviewed and stable  Last Vitals:  Vitals Value Taken Time  BP    Temp    Pulse    Resp    SpO2      Last Pain:  Vitals:   08/06/22 0931  TempSrc: Temporal  PainSc: 0-No pain         Complications: No notable events documented.

## 2022-08-06 NOTE — Anesthesia Preprocedure Evaluation (Addendum)
Anesthesia Evaluation  Patient identified by MRN, date of birth, ID band Patient awake    Reviewed: Allergy & Precautions, NPO status , Patient's Chart, lab work & pertinent test results  History of Anesthesia Complications Negative for: history of anesthetic complications  Airway Mallampati: IV   Neck ROM: Full    Dental  (+) Missing   Pulmonary sleep apnea and Continuous Positive Airway Pressure Ventilation , former smoker (quit 1994),    Pulmonary exam normal breath sounds clear to auscultation       Cardiovascular hypertension, + CAD (s/p MI and CABG) and + Peripheral Vascular Disease (carotid disease)  Normal cardiovascular exam Rhythm:Regular Rate:Normal  ECG 03/08/22:  Normal sinus rhythm Left axis deviation Left ventricular hypertrophy ( R in aVL , Cornell product , Romhilt-Estes ) Inferior infarct , age undetermined T wave abnormality, consider lateral ischemia  Echo 02/08/22:  NORMAL LEFT VENTRICULAR SYSTOLIC FUNCTION WITH AN ESTIMATED EF = >55 %  NORMAL RIGHT VENTRICULAR SYSTOLIC FUNCTION  MODERATE MITRAL VALVE INSUFFICIENCY  MILD TRICUSPID VALVE INSUFFICIENCY  TRACE AORTIC VALVE INSUFFICIENCY  NO VALVULAR STENOSIS  MODERATE LA ENLARGEMENT  MILD RV ENLARGEMENT  MILD RA ENLARGEMENT    Neuro/Psych PSYCHIATRIC DISORDERS Anxiety Diabetic retinopathy CVA (2019, 2021), No Residual Symptoms    GI/Hepatic Colon CA   Endo/Other  diabetes, Type 2Hypothyroidism   Renal/GU Renal disease (stage IV CKD)     Musculoskeletal   Abdominal   Peds  Hematology  (+) Blood dyscrasia, anemia ,   Anesthesia Other Findings Last dose of Ozempic 08/01/22   Cardiology note 06/19/22:  71 year old gentleman with known coronary disease, status post PTCA D1 06/27/9093, and CABG times 02/2006 at [facility]. Patient recently seen at [facility] ED 03/08/2022 with chest pain and atypical features, with negative troponin, nondiagnostic chest x-ray  and ECGs. Recent 2D echocardiogram 02/08/2022 revealed normal left ventricular function, with LVEF greater than 55%. The patient has essential hypertension, blood pressure adequately controlled on current BP medications. Patient has chronic kidney disease, stage IV. I am very hesitant to recommend cardiac catheterization at this time since patient would be at high risk for contrast-induced nephrotoxicity and renal failure  Plan   1. Continue current medications 2. Counseled patient about low-sodium diet 3. DASH diet printed instructions given to the patient 4. Counseled patient about low-cholesterol diet 5. Continue rosuvastatin for hyper lipid management 6. Low-fat and cholesterol diet printed instructions given to the patient 7. Diabetes diet printed instructions given to the patient 8. Return to clinic for follow-up in 4 months  No orders of the defined types were placed in this encounter.  Return in about 4 months (around 10/19/2022).   Reproductive/Obstetrics                            Anesthesia Physical Anesthesia Plan  ASA: 3  Anesthesia Plan: General   Post-op Pain Management:    Induction: Intravenous and Rapid sequence  PONV Risk Score and Plan: 2 and Propofol infusion, TIVA, Treatment may vary due to age or medical condition and Ondansetron  Airway Management Planned: Oral ETT  Additional Equipment:   Intra-op Plan:   Post-operative Plan: Extubation in OR  Informed Consent: I have reviewed the patients History and Physical, chart, labs and discussed the procedure including the risks, benefits and alternatives for the proposed anesthesia with the patient or authorized representative who has indicated his/her understanding and acceptance.     Dental advisory given  Plan Discussed with:  CRNA  Anesthesia Plan Comments: (Intubate for recent Ozempic dose and completion of colon prep.  Patient consented for risks of anesthesia including but  not limited to:  - adverse reactions to medications - damage to eyes, teeth, lips or other oral mucosa - nerve damage due to positioning  - sore throat or hoarseness - damage to heart, brain, nerves, lungs, other parts of body or loss of life  Informed patient about role of CRNA in peri- and intra-operative care.  Patient voiced understanding.)       Anesthesia Quick Evaluation

## 2022-08-07 ENCOUNTER — Encounter: Payer: Self-pay | Admitting: Gastroenterology

## 2022-08-07 LAB — SURGICAL PATHOLOGY

## 2022-08-12 NOTE — Progress Notes (Signed)
Referring Physician:  Kerry Dory, PA-C [place],  [place] [id number]  Primary Physician:  Renee Rival, NP  History of Present Illness: 08/12/2022 [name] is here today with a chief complaint of balance issues since his stroke in 2021.  He also has some left arm weakness that has been present for the last 2 years.  His wife thinks his balance is getting slowly worse.  He has not had any falls.  He denies any issues with dexterity changes or numbness or tingling in his hands.   Conservative measures:  Physical therapy:  has participated in 2021 or 2022 (cannot recall where he went) Multimodal medical therapy including regular antiinflammatories:  none Injections:  has not had any epidural steroid injections  Past Surgery: denies  Roberto Ingram has no symptoms of cervical myelopathy.  The symptoms are causing a significant impact on the patient's life.   Review of Systems:  A 10 point review of systems is negative, except for the pertinent positives and negatives detailed in the HPI.  Past Medical History: Past Medical History:  Diagnosis Date   Anemia    Anxiety    Generalized anxiety disorder   Asthmatic bronchitis    Atopic dermatitis    BPH (benign prostatic hyperplasia)    Cardiovascular disease    Carotid disease, bilateral (HCC)    Colon polyp    Constipation    Coronary artery disease    Diabetes mellitus without complication (HCC)    Diabetic retinopathy ([place])    GERD (gastroesophageal reflux disease)    Glaucoma of left eye    Gout    History of GI bleed    Hypercholesterolemia    Hypertension    Hypomagnesemia    Hypothyroidism    Insomnia    Mixed hyperlipidemia    Myocardial infarction [facility])    Renal disorder    Renal insufficiency    Sleep apnea    Obstructive sleep apnea, CPAP   Stroke [facility])    Thyroid disease    Vitamin D deficiency     Past Surgical History: Past Surgical History:  Procedure  Laterality Date   COLONOSCOPY     COLONOSCOPY WITH PROPOFOL N/A 02/24/2018   Procedure: COLONOSCOPY WITH PROPOFOL;  Surgeon: [place], Benay Pike, MD;  Location: [facility] ENDOSCOPY;  Service: Gastroenterology;  Laterality: N/A;   COLONOSCOPY WITH PROPOFOL N/A 08/06/2022   Procedure: COLONOSCOPY WITH PROPOFOL;  Surgeon: Lesly Rubenstein, MD;  Location: [facility] ENDOSCOPY;  Service: Gastroenterology;  Laterality: N/A;   CORONARY ANGIOPLASTY     CORONARY ARTERY BYPASS GRAFT     ESOPHAGOGASTRODUODENOSCOPY     EXPLORATION POST OPERATIVE OPEN HEART     EYE SURGERY     Lens eye surgery   NASAL POLYP EXCISION  1994   POLYPECTOMY     Quadruple bypass      Allergies: Allergies as of 08/13/2022 - Review Complete 08/06/2022  Allergen Reaction Noted   Diphenhydramine hcl Other (See Comments) 12/07/2012   Ezetimibe Hives 04/27/2014   Lopressor [metoprolol] Itching 01/24/2017   Metformin Hives 04/27/2014   Other Itching and Other (See Comments) 12/07/2012   Sulfa antibiotics Hives 04/27/2014    Medications: No outpatient medications have been marked as taking for the 08/13/22 encounter (Appointment) with Meade Maw, MD.    Social History: Social History   Tobacco Use   Smoking status: Former    Types: Cigarettes    Quit date: 06/11/1993    Years since  quitting: 29.1   Smokeless tobacco: Never  Vaping Use   Vaping Use: Never used  Substance Use Topics   Alcohol use: No   Drug use: No    Family Medical History: Family History  Problem Relation Age of Onset   Atrial fibrillation Mother    Heart disease Mother    Stroke Mother    Bell's palsy Mother    Kidney disease Mother    Cancer Mother    Diabetes Father    Arthritis Father    Stroke Brother    Diabetes Brother    Lung cancer Brother    Heart attack Brother    Cancer Brother    Heart disease Sister    COPD Sister    Throat cancer Sister     Physical Examination: There were no vitals filed for this  visit.  General: Patient is well developed, well nourished, calm, collected, and in no apparent distress. Attention to examination is appropriate.  Neck:   Supple.  Full range of motion.  Respiratory: Patient is breathing without any difficulty.   NEUROLOGICAL:     Awake, alert, oriented to person, place, and time.  Speech is clear and fluent. Fund of knowledge is appropriate.   Cranial Nerves: Pupils equal round and reactive to light.  Facial tone is symmetric.  Facial sensation is symmetric. Shoulder shrug is symmetric. Tongue protrusion is midline.  There is no pronator drift.  ROM of spine: full.    Strength: Side Biceps Triceps Deltoid Interossei Grip Wrist Ext. Wrist Flex.  R '5 5 5 5 5 5 5  '$ L 4+ 4+ 4+ '5 5 5 5   '$ Side Iliopsoas Quads Hamstring PF DF EHL  R '5 5 5 5 5 5  '$ L '5 5 5 5 5 5   '$ Reflexes are 1+ and symmetric at the biceps, triceps, brachioradialis, patella and achilles.   Hoffman's is absent.   Bilateral upper and lower extremity sensation is intact to light touch.    No evidence of dysmetria noted.  Gait is normal.     Medical Decision Making  Imaging: MRI C spine 07/19/22 IMPRESSION: 1. Mildly motion degraded exam, particularly on sagittal T2 imaging. Widespread cervical spine degeneration. 2. A moderate sized left paracentral disc herniation results in mild spinal stenosis and ventral cord mass effect at C6-C7. And there is mild multifactorial spinal stenosis without cord mass effect at both C3-C4 and C5-C6. No cord signal abnormality identified. 3. Moderate or severe degenerative neural foraminal stenosis at the left C3, bilateral C4, right C5, bilateral C6 and C7 nerve levels.     Electronically Signed   By: Genevie Ann M.D.   On: 07/21/2022 10:57  I have personally reviewed the images and agree with the above interpretation.  Assessment and Plan: Roberto Ingram is a pleasant 71 y.o. male with imbalance after a prior stroke.  He does not have classic  symptoms of cervical myelopathy.  I do not think that his symptoms are from his cervical spine disease.  I think he would benefit from physical therapy for balance training.  He and his wife will call around to their local physical therapist to see if there are options in the community.  If not, the outpatient [facility] physical therapy and physical therapy from [facility] in [place] have this option.  We discussed that site, proprioception, and inner ear and vestibular function are critical to balance.  I think he has a deficit in his vestibular function and possibly in sensory  function since his stroke.  I would not recommend any surgical intervention.  I will see him back on an as-needed basis.    I spent a total of 30 minutes in face-to-face and non-face-to-face activities related to this patient's care today.  Thank you for involving me in the care of this patient.      Broc Caspers K. Izora Ribas MD, [facility] Neurosurgery

## 2022-08-13 ENCOUNTER — Encounter: Payer: Self-pay | Admitting: Neurosurgery

## 2022-08-13 ENCOUNTER — Ambulatory Visit (INDEPENDENT_AMBULATORY_CARE_PROVIDER_SITE_OTHER): Payer: Medicare Other | Admitting: Neurosurgery

## 2022-08-13 VITALS — BP 128/81 | HR 72 | Ht 72.0 in | Wt 216.8 lb

## 2022-08-13 DIAGNOSIS — R2689 Other abnormalities of gait and mobility: Secondary | ICD-10-CM

## 2023-05-23 ENCOUNTER — Other Ambulatory Visit: Payer: Self-pay

## 2023-05-23 ENCOUNTER — Encounter: Payer: Self-pay | Admitting: *Deleted

## 2023-05-23 ENCOUNTER — Emergency Department: Payer: Medicare Other

## 2023-05-23 ENCOUNTER — Emergency Department
Admission: EM | Admit: 2023-05-23 | Discharge: 2023-05-23 | Disposition: A | Discharge status: Home / Self Care | Payer: Medicare Other | Attending: Emergency Medicine | Admitting: Emergency Medicine

## 2023-05-23 DIAGNOSIS — M7989 Other specified soft tissue disorders: Secondary | ICD-10-CM | POA: Diagnosis present

## 2023-05-23 DIAGNOSIS — Z79899 Other long term (current) drug therapy: Secondary | ICD-10-CM | POA: Insufficient documentation

## 2023-05-23 DIAGNOSIS — I2510 Atherosclerotic heart disease of native coronary artery without angina pectoris: Secondary | ICD-10-CM | POA: Insufficient documentation

## 2023-05-23 DIAGNOSIS — E119 Type 2 diabetes mellitus without complications: Secondary | ICD-10-CM | POA: Insufficient documentation

## 2023-05-23 DIAGNOSIS — R600 Localized edema: Secondary | ICD-10-CM | POA: Diagnosis not present

## 2023-05-23 DIAGNOSIS — I10 Essential (primary) hypertension: Secondary | ICD-10-CM | POA: Diagnosis not present

## 2023-05-23 DIAGNOSIS — N5089 Other specified disorders of the male genital organs: Secondary | ICD-10-CM | POA: Insufficient documentation

## 2023-05-23 DIAGNOSIS — T783XXA Angioneurotic edema, initial encounter: Secondary | ICD-10-CM | POA: Insufficient documentation

## 2023-05-23 LAB — CBC WITH DIFFERENTIAL/PLATELET
Abs Immature Granulocytes: 0.05 10*3/uL (ref 0.00–0.07)
Basophils Absolute: 0 10*3/uL (ref 0.0–0.1)
Basophils Relative: 0 %
Eosinophils Absolute: 0.6 10*3/uL — ABNORMAL HIGH (ref 0.0–0.5)
Eosinophils Relative: 4 %
HCT: 29.3 % — ABNORMAL LOW (ref 39.0–52.0)
Hemoglobin: 9.1 g/dL — ABNORMAL LOW (ref 13.0–17.0)
Immature Granulocytes: 0 %
Lymphocytes Relative: 20 %
Lymphs Abs: 2.6 10*3/uL (ref 0.7–4.0)
MCH: 27.6 pg (ref 26.0–34.0)
MCHC: 31.1 g/dL (ref 30.0–36.0)
MCV: 88.8 fL (ref 80.0–100.0)
Monocytes Absolute: 0.8 10*3/uL (ref 0.1–1.0)
Monocytes Relative: 6 %
Neutro Abs: 9.2 10*3/uL — ABNORMAL HIGH (ref 1.7–7.7)
Neutrophils Relative %: 70 %
Platelets: 377 10*3/uL (ref 150–400)
RBC: 3.3 MIL/uL — ABNORMAL LOW (ref 4.22–5.81)
RDW: 14.6 % (ref 11.5–15.5)
WBC: 13.3 10*3/uL — ABNORMAL HIGH (ref 4.0–10.5)
nRBC: 0 % (ref 0.0–0.2)

## 2023-05-23 LAB — BASIC METABOLIC PANEL
Anion gap: 7 (ref 5–15)
BUN: 19 mg/dL (ref 8–23)
CO2: 26 mmol/L (ref 22–32)
Calcium: 8.7 mg/dL — ABNORMAL LOW (ref 8.9–10.3)
Chloride: 102 mmol/L (ref 98–111)
Creatinine, Ser: 2.05 mg/dL — ABNORMAL HIGH (ref 0.61–1.24)
GFR, Estimated: 34 mL/min — ABNORMAL LOW (ref >60)
Glucose, Bld: 184 mg/dL — ABNORMAL HIGH (ref 70–99)
Potassium: 3.7 mmol/L (ref 3.5–5.1)
Sodium: 135 mmol/L (ref 135–145)

## 2023-05-23 LAB — BRAIN NATRIURETIC PEPTIDE: B Natriuretic Peptide: 193.3 pg/mL — ABNORMAL HIGH (ref 0.0–100.0)

## 2023-05-23 MED ORDER — DIPHENHYDRAMINE HCL 50 MG/ML IJ SOLN
25.0000 mg | Freq: Once | INTRAMUSCULAR | Status: AC
  Administered 2023-05-23: 25 mg via INTRAVENOUS
  Filled 2023-05-23: qty 1

## 2023-05-23 MED ORDER — DIPHENHYDRAMINE HCL 25 MG PO CAPS: 25.0000 mg | ORAL_CAPSULE | Freq: Four times a day (QID) | ORAL | 0 refills | Status: DC | PRN

## 2023-05-23 NOTE — ED Triage Notes (Signed)
BIB spouse from home for generalized swelling after taking new diuretic furosemide yesterday, seen by PCP for scrotal and feet swelling, prescribed diuretic, then subsequently developed swelling to face, lips,mouth. Airway patent. Tolerating secretions and POs. Denies pain, sob, nausea itching or fever. Alert, NAD, calm, interactive. EDPA present on arrival.

## 2023-05-23 NOTE — ED Provider Triage Note (Signed)
Emergency Medicine Provider Triage Evaluation Note  Roberto Ingram, a 72 y.o. male  was evaluated in triage.  Pt complains of swelling to the head after taking the first dose of his furosemide yesterday.  Patient would also note some swelling to his scrotum yesterday evening.  He subsequently called kidney specialist who advised that he discontinue the diuretic.  Patient denies any difficulty breathing, swallowing, controlling oral secretions. He denies any swelling to the tongue, throat.  Patient with a history of diabetes, HTN, CVA, HLD, OSA, does have a med list that includes lisinopril, amlodipine, and some other medications.  Patient denies any previous episode of angioedema.  He would endorse that the left foot swelling has been persistent for several weeks.  Review of Systems  Positive: Angioedema, left foot edema, scrotal edema Negative: CP, SOB  Physical Exam  BP (!) 135/53 (BP Location: Left Arm)   Pulse 60   Temp 98.7 F (37.1 C) (Oral)   Resp 20   Ht 6' (1.829 m)   Wt 103 kg   SpO2 97%   BMI 30.79 kg/m  Gen:   Awake, no distress  NAD all day Resp:  Normal effort CTA MSK:   Moves extremities without difficulty R foot edema.  Other:    Medical Decision Making  Medically screening exam initiated at 11:56 AM.  Appropriate orders placed.  DAVIDE HENSLEE was informed that the remainder of the evaluation will be completed by another provider, this initial triage assessment does not replace that evaluation, and the importance of remaining in the ED until their evaluation is complete.  Patient to the ED for evaluation of angioedema and scrotal swelling with onset yesterday evening after taking a morning dose of furosemide.  He also endorses several weeks of right foot swelling.   Lissa Hoard, PA-C 05/23/23 1200

## 2023-05-23 NOTE — Discharge Instructions (Signed)
You are having lip swelling due to angioedema, which is likely caused by medication (most likely by the lisinopril).  The swelling to your legs and scrotum is likely caused by fluid buildup related to your heart or kidneys.  STOP the lisinopril.  STOP the Lasix for now.  You may take Benadryl up to every 6 hours as needed for swelling or itching.  Keep the legs elevated when you are able.    Call your doctors (specifically her kidney doctor who started the Lasix) on Monday to figure out next steps.  They may recommend that you start back on the Lasix to help with the fluid in your legs and scrotum.  In the meantime, return here or to the nearest ER immediately for new, worsening, or persistent lip or facial swelling, any swelling to the tongue or sensation of swelling or tightness in the back of your throat, change in her voice, shortness of breath, or any other new or worsening symptoms that concern you.

## 2023-05-23 NOTE — ED Provider Notes (Signed)
[facility] Provider Note    Event Date/Time   First MD Initiated Contact with Patient 05/23/23 1241     (approximate)   History   Oral Swelling   HPI  Roberto Ingram is a 72 y.o. male with a history of CAD, diabetes, hypertension, hypercholesterolemia, OSA on CPAP, stroke, BPH, GERD, GIST tumor and gout who presents with swelling.  The patient states that he has had some swelling in his legs over the last 3 days, worse on the left but occurring bilaterally.  The wife states that the patient has had somewhat chronic swelling in the right leg since hospitalization for a GI bleed a few months ago.  After the leg started swelling, the patient then developed scrotal swelling in the last couple of days.  He saw several of his doctors 2 days ago and was started on Lasix.  He took 1 dose yesterday.  Subsequently he developed swelling to the lips which was originally on the left side but now has spread to both sides.  He denies any tongue swelling or any sensation of swelling in his throat.  He denies any shortness of breath or difficulty swallowing.  She states he has never had this before.  His wife gave him Benadryl last night with some relief but then the swelling spread more today.  He has not taken anything for it today.  The patient is on lisinopril but states he has been on this for a long time.  He also recently started on Imdur.  I reviewed the past medical records.  The patient was seen by Dr. Darrold Junker from cardiology on 7/10.  No medication changes were made at that time.  He was also seen by oncology and by geriatrics on 7/9.  The patient on a myocardial perfusion study in late June and an echo showing normal LVEF.   Physical Exam   Triage Vital Signs: ED Triage Vitals  Encounter Vitals Group     BP 05/23/23 1137 (!) 135/53     Systolic BP Percentile --      Diastolic BP Percentile --      Pulse Rate 05/23/23 1137 60     Resp 05/23/23 1137 20     Temp  05/23/23 1137 98.7 F (37.1 C)     Temp Source 05/23/23 1137 Oral     SpO2 05/23/23 1137 97 %     Weight 05/23/23 1138 227 lb (103 kg)     Height 05/23/23 1138 6' (1.829 m)     Head Circumference --      Peak Flow --      Pain Score 05/23/23 1138 0     Pain Loc --      Pain Education --      Exclude from Growth Chart --     Most recent vital signs: Vitals:   05/23/23 1137  BP: (!) 135/53  Pulse: 60  Resp: 20  Temp: 98.7 F (37.1 C)  SpO2: 97%     General: Awake, well-appearing, no distress.  CV:  Good peripheral perfusion.  Resp:  Normal effort.  No rales or wheezes. Abd:  No distention.  Other:  1+ right lower extremity pitting edema.  Trace left lower extremity edema.  Mild to moderate scrotal edema with no erythema, induration, or abnormal warmth.  No tenderness.  Bilateral upper and lower lip swelling.  Oropharynx clear.  No stridor or pooled secretions.  Tongue appears normal.  Uvula is normal.  ED Results / Procedures / Treatments   Labs (all labs ordered are listed, but only abnormal results are displayed) Labs Reviewed  CBC WITH DIFFERENTIAL/PLATELET - Abnormal; Notable for the following components:      Result Value   WBC 13.3 (*)    RBC 3.30 (*)    Hemoglobin 9.1 (*)    HCT 29.3 (*)    Neutro Abs 9.2 (*)    Eosinophils Absolute 0.6 (*)    All other components within normal limits  BASIC METABOLIC PANEL - Abnormal; Notable for the following components:   Glucose, Bld 184 (*)    Creatinine, Ser 2.05 (*)    Calcium 8.7 (*)    GFR, Estimated 34 (*)    All other components within normal limits  BRAIN NATRIURETIC PEPTIDE - Abnormal; Notable for the following components:   B Natriuretic Peptide 193.3 (*)    All other components within normal limits     EKG  ED ECG REPORT I, Dionne Bucy, the attending physician, personally viewed and interpreted this ECG.  Date: 05/23/2023 EKG Time: 1248 Rate: 58 Rhythm: normal sinus rhythm QRS Axis:  normal Intervals: normal ST/T Wave abnormalities: LVH with repolarization abnormality Narrative Interpretation: no evidence of acute ischemia   RADIOLOGY  Chest x-ray: I independently viewed and interpreted the images; there is no focal consolidation or edema  US venous RLE: No acute DVT  PROCEDURES:  Critical Care performed: No  Procedures   MEDICATIONS ORDERED IN ED: Medications  diphenhydrAMINE (BENADRYL) injection 25 mg (25 mg Intravenous Given 05/23/23 1403)     IMPRESSION / MDM / ASSESSMENT AND PLAN / ED COURSE  I reviewed the triage vital signs and the nursing notes.  72 year old male with PMH as noted above presents with bilateral lower extremity and scrotal swelling over the last several days with somewhat more subacute right lower extremity swelling, and now with bilateral lip swelling after starting Lasix yesterday.  Differential diagnosis includes, but is not limited to:  Leg/scrotal swelling: CHF, dependent edema, venous stasis, kidney disease.  So far CHF appears less likely given that the patient had a normal LVEF on the recent echo.  BNP is minimally elevated.  Chest x-ray does not show evidence of edema.  Since the swelling is asymmetrical and it does not appear from the review of the patient's records that he has had ultrasound imaging of the right leg which is more swollen, I will order ultrasound to rule out DVT.  At this time I will hold off on further diuresis in case the patient's lip swelling is related to the Lasix.  The scrotal swelling is not associated with any induration or evidence of infection, the patient has no pain or tenderness to suggest testicular process.  There is no indication for further workup.  Lip swelling: Allergic reaction, angioedema, ACE inhibitor induced angioedema.  This started right after he was started on Lasix, however the patient is also on lisinopril.  The etiology is unclear.  Given that he is not having any respiratory  distress or hypoxia, he does not emergently need any diuresis.  I will give Benadryl.  Given the patient's extensive cardiac history and the lack of any concerning airway swelling I will hold off on epinephrine.  I discussed prednisone with the patient although he reports a significant steamy episode after he was given a prednisone shot previously and declines at this time.  Patient's presentation is most consistent with acute presentation with potential threat to life or bodily function.  -----------------------------------------  4:20 PM on 05/23/2023 -----------------------------------------  Ultrasound is negative for DVT.  On reassessment, the patient has not had any worsening in the lip swelling and continues to have no swelling or discomfort to the tongue or throat.  He continues to speak in a clear voice.  I counseled the patient and his wife on the results of the workup.  I did consider whether the patient may benefit from inpatient admission given likely angioedema.  However since it is mild, confined to the lips, has been stable since yesterday, and he has had no signs or symptoms of oropharyngeal or airway involvement, I think discharge home with close outpatient follow-up is also reasonable.  The patient and wife state they would prefer to go home.  At this time I suspect that the angioedema is more likely related to lisinopril than Lasix.  I have instructed the patient to discontinue both medications.  Given that he has no evidence of acute CHF, pulmonary edema, or other concerning acute findings, he does not need emergent diuresis.  I instructed him to elevate his legs.  As above we will not treat with epinephrine or steroids but I recommended that he take Benadryl as needed over the next few days.  The angioedema should resolve once he is off the medications.  I instructed him to follow-up with his primary care provider and nephrologist next week.  They may recommend that he restart the  Lasix once the angioedema has resolved.  I gave the patient and his wife strict return precautions and they expressed understanding.   FINAL CLINICAL IMPRESSION(S) / ED DIAGNOSES   Final diagnoses:  Peripheral edema  Angioedema, initial encounter     Rx / DC Orders   ED Discharge Orders          Ordered    diphenhydrAMINE (BENADRYL ALLERGY) 25 mg capsule  Every 6 hours PRN        05/23/23 1618             Note:  This document was prepared using Dragon voice recognition software and may include unintentional dictation errors.    Dionne Bucy, MD 05/23/23 1718

## 2023-05-23 NOTE — ED Notes (Signed)
See triage notes. Patient has swelling to face, lips and mouth. Airway patent.

## 2023-06-22 ENCOUNTER — Other Ambulatory Visit: Payer: Self-pay

## 2023-06-22 ENCOUNTER — Emergency Department
Admission: EM | Admit: 2023-06-22 | Discharge: 2023-06-23 | Disposition: A | Discharge status: Home / Self Care | Payer: Medicare Other | Attending: Emergency Medicine | Admitting: Emergency Medicine

## 2023-06-22 ENCOUNTER — Emergency Department: Payer: Medicare Other

## 2023-06-22 DIAGNOSIS — N189 Chronic kidney disease, unspecified: Secondary | ICD-10-CM | POA: Insufficient documentation

## 2023-06-22 DIAGNOSIS — R591 Generalized enlarged lymph nodes: Secondary | ICD-10-CM | POA: Insufficient documentation

## 2023-06-22 DIAGNOSIS — R0789 Other chest pain: Secondary | ICD-10-CM | POA: Diagnosis present

## 2023-06-22 LAB — CBC
HCT: 30.6 % — ABNORMAL LOW (ref 39.0–52.0)
Hemoglobin: 9.6 g/dL — ABNORMAL LOW (ref 13.0–17.0)
MCH: 27.3 pg (ref 26.0–34.0)
MCHC: 31.4 g/dL (ref 30.0–36.0)
MCV: 86.9 fL (ref 80.0–100.0)
Platelets: 521 10*3/uL — ABNORMAL HIGH (ref 150–400)
RBC: 3.52 MIL/uL — ABNORMAL LOW (ref 4.22–5.81)
RDW: 14.6 % (ref 11.5–15.5)
WBC: 16.8 10*3/uL — ABNORMAL HIGH (ref 4.0–10.5)
nRBC: 0 % (ref 0.0–0.2)

## 2023-06-22 LAB — BASIC METABOLIC PANEL
Anion gap: 9 (ref 5–15)
BUN: 28 mg/dL — ABNORMAL HIGH (ref 8–23)
CO2: 25 mmol/L (ref 22–32)
Calcium: 8.7 mg/dL — ABNORMAL LOW (ref 8.9–10.3)
Chloride: 99 mmol/L (ref 98–111)
Creatinine, Ser: 1.93 mg/dL — ABNORMAL HIGH (ref 0.61–1.24)
GFR, Estimated: 36 mL/min — ABNORMAL LOW (ref >60)
Glucose, Bld: 245 mg/dL — ABNORMAL HIGH (ref 70–99)
Potassium: 4.5 mmol/L (ref 3.5–5.1)
Sodium: 133 mmol/L — ABNORMAL LOW (ref 135–145)

## 2023-06-22 LAB — TROPONIN I (HIGH SENSITIVITY)
Troponin I (High Sensitivity): 9 ng/L (ref <18)
Troponin I (High Sensitivity): 8 ng/L (ref <18)

## 2023-06-22 NOTE — ED Triage Notes (Signed)
Pt to ED via POV c/o left sided chest pain that started around 1pm that has not gone away. Pt describes as a squeezing pain that is non radiating. Hx of gastric surgery 7/26. Pt given 1nitro at 6:45 pm, pain went away but came came back. Pt denies SOB, fevers, dizziness, N/V.

## 2023-06-23 ENCOUNTER — Emergency Department: Payer: Medicare Other

## 2023-06-23 DIAGNOSIS — R0789 Other chest pain: Secondary | ICD-10-CM | POA: Diagnosis not present

## 2023-06-23 LAB — URINALYSIS, ROUTINE W REFLEX MICROSCOPIC
Bacteria, UA: NONE SEEN
Bilirubin Urine: NEGATIVE
Glucose, UA: NEGATIVE mg/dL
Hgb urine dipstick: NEGATIVE
Ketones, ur: NEGATIVE mg/dL
Leukocytes,Ua: NEGATIVE
Nitrite: NEGATIVE
Protein, ur: >=300 mg/dL — AB
Specific Gravity, Urine: 1.014 (ref 1.005–1.030)
pH: 5 (ref 5.0–8.0)

## 2023-06-23 MED ORDER — SODIUM CHLORIDE 0.9 % IV BOLUS
500.0000 mL | Freq: Once | INTRAVENOUS | Status: AC
  Administered 2023-06-23: 500 mL via INTRAVENOUS

## 2023-06-23 NOTE — Discharge Instructions (Signed)
As we discussed, your evaluation tonight was reassuring with no evidence of a severe or concerning acute medical condition at this time.  The CT scan we obtained showed that you have some enlarged lymph nodes in your left armpit (axilla) and left groin.  We recommend you talk with your primary care doctor about this; she may be able to obtain the records from [facility] with the CT report and may want to do some additional outpatient testing.  For now, please continue with your regular medications.  Return to the emergency department if you develop new or worsening symptoms that concern you.

## 2023-06-23 NOTE — ED Provider Notes (Signed)
[facility] Provider Note    Event Date/Time   First MD Initiated Contact with Patient 06/22/23 2329     (approximate)   History   Chest Pain   HPI Roberto Ingram is a 72 y.o. male who presents for evaluation of a few concerns.  He states that he has been having some chest pain that started around 1 PM today.  It is not constant and he said that he feels that if he takes a deep breath or coughs.  It feels like a squeezing pain.  He said that it went away when he took a nitroglycerin but then it seemed to come back.  He has not had any shortness of breath, fever, dizziness, passing out, vomiting, nor nausea.  He said that he had gastric surgery between 2 and 3 weeks ago and is upper abdomen hurts but the lower abdomen feels okay.  The pain has been more or less constant since the surgery and has not changed or gotten worse.  He also said that he has been having some swelling in his scrotum and some swelling in his right foot that he thinks is gout.  He was prescribed prednisone by his regular doctor a few days ago but he did not start taking it because he was concerned about his blood glucose going up.     Physical Exam   Triage Vital Signs: ED Triage Vitals  Encounter Vitals Group     BP 06/22/23 2040 (!) 140/95     Systolic BP Percentile --      Diastolic BP Percentile --      Pulse Rate 06/22/23 2040 71     Resp 06/22/23 2040 18     Temp 06/22/23 2040 98.7 F (37.1 C)     Temp Source 06/22/23 2040 Oral     SpO2 06/22/23 2040 98 %     Weight 06/22/23 2037 97.1 kg (214 lb)     Height 06/22/23 2037 1.829 m (6')     Head Circumference --      Peak Flow --      Pain Score 06/22/23 2036 8     Pain Loc --      Pain Education --      Exclude from Growth Chart --     Most recent vital signs: Vitals:   06/23/23 0200 06/23/23 0230  BP: (!) 163/64 (!) 167/61  Pulse: 69 66  Resp: (!) 23 (!) 24  Temp:    SpO2: 96% 100%    General: Awake, no obvious  distress.  Alert and oriented. CV:  Good peripheral perfusion.  Regular rate and rhythm.  Normal heart sounds.  No reproducible chest wall pain. Resp:  Normal effort. Speaking easily and comfortably, no accessory muscle usage nor intercostal retractions.  Lungs are clear to auscultation bilaterally. Abd:  No distention.  Mildly tender to palpation throughout the abdomen but without any localized peritonitis.  Patient's recent surgical scars appear healthy and are healing well.  No lower abdominal tenderness.  No rebound or guarding. Other:  Patient has some chronic peripheral edema and increased amount of swelling around the patient's right foot and specifically the ankle.  No pitting edema.  No podagra.  No substantial pain with palpation or range of motion.   ED Results / Procedures / Treatments   Labs (all labs ordered are listed, but only abnormal results are displayed) Labs Reviewed  BASIC METABOLIC PANEL - Abnormal; Notable for the following components:  Result Value   Sodium 133 (*)    Glucose, Bld 245 (*)    BUN 28 (*)    Creatinine, Ser 1.93 (*)    Calcium 8.7 (*)    GFR, Estimated 36 (*)    All other components within normal limits  CBC - Abnormal; Notable for the following components:   WBC 16.8 (*)    RBC 3.52 (*)    Hemoglobin 9.6 (*)    HCT 30.6 (*)    Platelets 521 (*)    All other components within normal limits  URINALYSIS, ROUTINE W REFLEX MICROSCOPIC - Abnormal; Notable for the following components:   Color, Urine YELLOW (*)    APPearance CLEAR (*)    Protein, ur >=300 (*)    All other components within normal limits  TROPONIN I (HIGH SENSITIVITY)  TROPONIN I (HIGH SENSITIVITY)     EKG  ED ECG REPORT I, Loleta Rose, the attending physician, personally viewed and interpreted this ECG.  Date: 06/22/2023 EKG Time: 23: 35 Rate: 69 Rhythm: normal sinus rhythm QRS Axis: normal Intervals: normal ST/T Wave abnormalities: Non-specific ST segment /  T-wave changes, but no clear evidence of acute ischemia. Narrative Interpretation: no definitive evidence of acute ischemia; does not meet STEMI criteria.    RADIOLOGY I viewed and interpreted the patient's two-view chest x-ray and his CT chest/abdomen/pelvis without contrast.  The two-view chest x-ray is reassuring with no evidence of pneumonia, pneumothorax, nor fluid.  Radiology report agrees.  The chest/abdomen/pelvis CT shows multiple areas of lymphadenopathy including in the left axilla and the left groin, identified by the radiologist (I interpreted no acute or emergent abnormality such as obstruction or postoperative infection.   PROCEDURES:  Critical Care performed: No  .1-3 Lead EKG Interpretation  Performed by: Loleta Rose, MD Authorized by: Loleta Rose, MD     Interpretation: normal     ECG rate:  68   ECG rate assessment: normal     Rhythm: sinus rhythm     Ectopy: none     Conduction: normal       IMPRESSION / MDM / ASSESSMENT AND PLAN / ED COURSE  I reviewed the triage vital signs and the nursing notes.                              Differential diagnosis includes, but is not limited to, ACS, PE, pneumonia, pneumothorax, postoperative infection or other complication, CHF exacerbation.  Patient's presentation is most consistent with acute presentation with potential threat to life or bodily function.  Labs/studies ordered: EKG, two-view chest x-ray, CT chest/abdomen/pelvis, CBC, BMP, high-sensitivity troponin x 2, urinalysis  Interventions/Medications given:  Medications  sodium chloride 0.9 % bolus 500 mL (500 mLs Intravenous New Bag/Given 06/23/23 0216)    (Note:  hospital course my include additional interventions and/or labs/studies not listed above.)  Labs are notable for leukocytosis of 16.8 but I think this might be at least partially due to volume depletion given that his platelets are also elevated at 521.  I think that he is a bit  intravascularly depleted and I ordered 500 mL of normal saline.  Chronic kidney disease is stable.  No evidence of UTI on urinalysis.  No evidence that the patient having an emergent or acute CHF exacerbation.  Doubt ACS and patient has reassuring high-sensitivity troponin and EKG.  Chest x-ray was nondiagnostic and I ordered a CT chest/abdomen/pelvis without IV contrast (not only does he  have chronic kidney disease, but he has reported allergy to IV contrast material, so I felt that the risk of a contrast study was greater than the benefit).  There were also no acute issues identified on the noncontrast CT scan, although he has left axillary and left inguinal lymphadenopathy that should be followed up as an outpatient.  I will reassess the patient to determine if his symptoms have changed but at this point there is no indication he requires hospitalization.  The patient is on the cardiac monitor to evaluate for evidence of arrhythmia and/or significant heart rate changes.   Clinical Course as of 06/23/23 0246  Mon Jun 23, 2023  0241 Reassessed the patient and he is not having any chest pain.  I updated him and his wife about all the findings including the lymphadenopathy.  They will follow-up as an outpatient with his primary care doctor.  I considered hospitalization for this patient, but I cannot identify a particular acute issue that requires hospitalization now that cannot be addressed as an outpatient, and I do not believe he is at immediate risk of life-threatening or limb threatening danger.  He is very comfortable and remains hemodynamically stable.  They understand and agree with the plan and I gave strict return precautions. [CF]    Clinical Course User Index [CF] Loleta Rose, MD     FINAL CLINICAL IMPRESSION(S) / ED DIAGNOSES   Final diagnoses:  Chest discomfort  Chronic kidney disease, unspecified CKD stage  Lymphadenopathy     Rx / DC Orders   ED Discharge Orders      None        Note:  This document was prepared using Dragon voice recognition software and may include unintentional dictation errors.   Loleta Rose, MD 06/23/23 [id number]

## 2023-07-13 HISTORY — PX: OTHER SURGICAL HISTORY: SHX169

## 2023-07-18 ENCOUNTER — Other Ambulatory Visit: Payer: Self-pay

## 2023-07-18 ENCOUNTER — Inpatient Hospital Stay
Admission: EM | Admit: 2023-07-18 | Discharge: 2023-07-24 | DRG: 853 | Disposition: A | Discharge status: Home Health | Payer: Medicare Other | Attending: Internal Medicine | Admitting: Internal Medicine

## 2023-07-18 ENCOUNTER — Emergency Department: Payer: Medicare Other

## 2023-07-18 ENCOUNTER — Encounter: Payer: Self-pay | Admitting: Internal Medicine

## 2023-07-18 DIAGNOSIS — G4733 Obstructive sleep apnea (adult) (pediatric): Secondary | ICD-10-CM | POA: Diagnosis present

## 2023-07-18 DIAGNOSIS — Z1152 Encounter for screening for COVID-19: Secondary | ICD-10-CM

## 2023-07-18 DIAGNOSIS — Z794 Long term (current) use of insulin: Secondary | ICD-10-CM

## 2023-07-18 DIAGNOSIS — K219 Gastro-esophageal reflux disease without esophagitis: Secondary | ICD-10-CM | POA: Diagnosis present

## 2023-07-18 DIAGNOSIS — E860 Dehydration: Secondary | ICD-10-CM | POA: Diagnosis present

## 2023-07-18 DIAGNOSIS — Z903 Acquired absence of stomach [part of]: Secondary | ICD-10-CM

## 2023-07-18 DIAGNOSIS — N1832 Chronic kidney disease, stage 3b: Secondary | ICD-10-CM | POA: Diagnosis present

## 2023-07-18 DIAGNOSIS — Z7902 Long term (current) use of antithrombotics/antiplatelets: Secondary | ICD-10-CM

## 2023-07-18 DIAGNOSIS — D631 Anemia in chronic kidney disease: Secondary | ICD-10-CM | POA: Diagnosis present

## 2023-07-18 DIAGNOSIS — C8334 Diffuse large B-cell lymphoma, lymph nodes of axilla and upper limb: Secondary | ICD-10-CM | POA: Diagnosis present

## 2023-07-18 DIAGNOSIS — Z841 Family history of disorders of kidney and ureter: Secondary | ICD-10-CM

## 2023-07-18 DIAGNOSIS — Z951 Presence of aortocoronary bypass graft: Secondary | ICD-10-CM

## 2023-07-18 DIAGNOSIS — E43 Unspecified severe protein-calorie malnutrition: Secondary | ICD-10-CM | POA: Diagnosis present

## 2023-07-18 DIAGNOSIS — Z8673 Personal history of transient ischemic attack (TIA), and cerebral infarction without residual deficits: Secondary | ICD-10-CM

## 2023-07-18 DIAGNOSIS — E1122 Type 2 diabetes mellitus with diabetic chronic kidney disease: Secondary | ICD-10-CM | POA: Diagnosis present

## 2023-07-18 DIAGNOSIS — Z8249 Family history of ischemic heart disease and other diseases of the circulatory system: Secondary | ICD-10-CM

## 2023-07-18 DIAGNOSIS — N179 Acute kidney failure, unspecified: Secondary | ICD-10-CM | POA: Diagnosis present

## 2023-07-18 DIAGNOSIS — J45909 Unspecified asthma, uncomplicated: Secondary | ICD-10-CM | POA: Diagnosis present

## 2023-07-18 DIAGNOSIS — Z79890 Hormone replacement therapy (postmenopausal): Secondary | ICD-10-CM

## 2023-07-18 DIAGNOSIS — E782 Mixed hyperlipidemia: Secondary | ICD-10-CM | POA: Diagnosis present

## 2023-07-18 DIAGNOSIS — I10 Essential (primary) hypertension: Secondary | ICD-10-CM | POA: Diagnosis not present

## 2023-07-18 DIAGNOSIS — G4700 Insomnia, unspecified: Secondary | ICD-10-CM | POA: Diagnosis present

## 2023-07-18 DIAGNOSIS — I2510 Atherosclerotic heart disease of native coronary artery without angina pectoris: Secondary | ICD-10-CM | POA: Diagnosis present

## 2023-07-18 DIAGNOSIS — Z823 Family history of stroke: Secondary | ICD-10-CM

## 2023-07-18 DIAGNOSIS — Z882 Allergy status to sulfonamides status: Secondary | ICD-10-CM

## 2023-07-18 DIAGNOSIS — N400 Enlarged prostate without lower urinary tract symptoms: Secondary | ICD-10-CM | POA: Diagnosis present

## 2023-07-18 DIAGNOSIS — F419 Anxiety disorder, unspecified: Secondary | ICD-10-CM | POA: Diagnosis present

## 2023-07-18 DIAGNOSIS — I129 Hypertensive chronic kidney disease with stage 1 through stage 4 chronic kidney disease, or unspecified chronic kidney disease: Secondary | ICD-10-CM | POA: Diagnosis present

## 2023-07-18 DIAGNOSIS — Z87891 Personal history of nicotine dependence: Secondary | ICD-10-CM

## 2023-07-18 DIAGNOSIS — Z8261 Family history of arthritis: Secondary | ICD-10-CM

## 2023-07-18 DIAGNOSIS — F411 Generalized anxiety disorder: Secondary | ICD-10-CM | POA: Diagnosis present

## 2023-07-18 DIAGNOSIS — A419 Sepsis, unspecified organism: Secondary | ICD-10-CM | POA: Diagnosis present

## 2023-07-18 DIAGNOSIS — E039 Hypothyroidism, unspecified: Secondary | ICD-10-CM | POA: Diagnosis present

## 2023-07-18 DIAGNOSIS — R531 Weakness: Principal | ICD-10-CM

## 2023-07-18 DIAGNOSIS — N390 Urinary tract infection, site not specified: Secondary | ICD-10-CM | POA: Diagnosis present

## 2023-07-18 DIAGNOSIS — E1165 Type 2 diabetes mellitus with hyperglycemia: Secondary | ICD-10-CM | POA: Diagnosis not present

## 2023-07-18 DIAGNOSIS — Z833 Family history of diabetes mellitus: Secondary | ICD-10-CM

## 2023-07-18 DIAGNOSIS — I252 Old myocardial infarction: Secondary | ICD-10-CM

## 2023-07-18 DIAGNOSIS — E785 Hyperlipidemia, unspecified: Secondary | ICD-10-CM | POA: Diagnosis present

## 2023-07-18 DIAGNOSIS — Z91041 Radiographic dye allergy status: Secondary | ICD-10-CM

## 2023-07-18 DIAGNOSIS — Z808 Family history of malignant neoplasm of other organs or systems: Secondary | ICD-10-CM

## 2023-07-18 DIAGNOSIS — Z6829 Body mass index (BMI) 29.0-29.9, adult: Secondary | ICD-10-CM

## 2023-07-18 DIAGNOSIS — E871 Hypo-osmolality and hyponatremia: Secondary | ICD-10-CM | POA: Diagnosis present

## 2023-07-18 DIAGNOSIS — Z7951 Long term (current) use of inhaled steroids: Secondary | ICD-10-CM

## 2023-07-18 DIAGNOSIS — H409 Unspecified glaucoma: Secondary | ICD-10-CM | POA: Diagnosis present

## 2023-07-18 DIAGNOSIS — Z825 Family history of asthma and other chronic lower respiratory diseases: Secondary | ICD-10-CM

## 2023-07-18 DIAGNOSIS — Z7982 Long term (current) use of aspirin: Secondary | ICD-10-CM

## 2023-07-18 DIAGNOSIS — E11319 Type 2 diabetes mellitus with unspecified diabetic retinopathy without macular edema: Secondary | ICD-10-CM | POA: Diagnosis present

## 2023-07-18 DIAGNOSIS — Z9861 Coronary angioplasty status: Secondary | ICD-10-CM

## 2023-07-18 DIAGNOSIS — Z79899 Other long term (current) drug therapy: Secondary | ICD-10-CM

## 2023-07-18 DIAGNOSIS — E1169 Type 2 diabetes mellitus with other specified complication: Secondary | ICD-10-CM | POA: Diagnosis present

## 2023-07-18 DIAGNOSIS — Z801 Family history of malignant neoplasm of trachea, bronchus and lung: Secondary | ICD-10-CM

## 2023-07-18 DIAGNOSIS — Z886 Allergy status to analgesic agent status: Secondary | ICD-10-CM

## 2023-07-18 DIAGNOSIS — Z888 Allergy status to other drugs, medicaments and biological substances status: Secondary | ICD-10-CM

## 2023-07-18 LAB — HEPATIC FUNCTION PANEL
ALT: 16 U/L (ref 0–44)
AST: 18 U/L (ref 15–41)
Albumin: 2.6 g/dL — ABNORMAL LOW (ref 3.5–5.0)
Alkaline Phosphatase: 60 U/L (ref 38–126)
Bilirubin, Direct: 0.1 mg/dL (ref 0.0–0.2)
Indirect Bilirubin: 0.5 mg/dL (ref 0.3–0.9)
Total Bilirubin: 0.6 mg/dL (ref 0.3–1.2)
Total Protein: 7.3 g/dL (ref 6.5–8.1)

## 2023-07-18 LAB — BASIC METABOLIC PANEL
Anion gap: 8 (ref 5–15)
BUN: 34 mg/dL — ABNORMAL HIGH (ref 8–23)
CO2: 25 mmol/L (ref 22–32)
Calcium: 8.4 mg/dL — ABNORMAL LOW (ref 8.9–10.3)
Chloride: 96 mmol/L — ABNORMAL LOW (ref 98–111)
Creatinine, Ser: 2.75 mg/dL — ABNORMAL HIGH (ref 0.61–1.24)
GFR, Estimated: 24 mL/min — ABNORMAL LOW (ref >60)
Glucose, Bld: 257 mg/dL — ABNORMAL HIGH (ref 70–99)
Potassium: 4.7 mmol/L (ref 3.5–5.1)
Sodium: 129 mmol/L — ABNORMAL LOW (ref 135–145)

## 2023-07-18 LAB — URINALYSIS, ROUTINE W REFLEX MICROSCOPIC
Bacteria, UA: NONE SEEN
Bilirubin Urine: NEGATIVE
Glucose, UA: NEGATIVE mg/dL
Hgb urine dipstick: NEGATIVE
Ketones, ur: NEGATIVE mg/dL
Leukocytes,Ua: NEGATIVE
Nitrite: NEGATIVE
Protein, ur: 100 mg/dL — AB
Specific Gravity, Urine: 1.015 (ref 1.005–1.030)
pH: 5 (ref 5.0–8.0)

## 2023-07-18 LAB — LACTIC ACID, PLASMA
Lactic Acid, Venous: 1.7 mmol/L (ref 0.5–1.9)
Lactic Acid, Venous: 1.9 mmol/L (ref 0.5–1.9)

## 2023-07-18 LAB — GROUP A STREP BY PCR: Group A Strep by PCR: NOT DETECTED

## 2023-07-18 LAB — CBC
HCT: 31.2 % — ABNORMAL LOW (ref 39.0–52.0)
Hemoglobin: 9.7 g/dL — ABNORMAL LOW (ref 13.0–17.0)
MCH: 26.6 pg (ref 26.0–34.0)
MCHC: 31.1 g/dL (ref 30.0–36.0)
MCV: 85.5 fL (ref 80.0–100.0)
Platelets: 345 10*3/uL (ref 150–400)
RBC: 3.65 MIL/uL — ABNORMAL LOW (ref 4.22–5.81)
RDW: 15.9 % — ABNORMAL HIGH (ref 11.5–15.5)
WBC: 13.6 10*3/uL — ABNORMAL HIGH (ref 4.0–10.5)
nRBC: 0 % (ref 0.0–0.2)

## 2023-07-18 LAB — LIPASE, BLOOD: Lipase: 42 U/L (ref 11–51)

## 2023-07-18 LAB — SARS CORONAVIRUS 2 BY RT PCR: SARS Coronavirus 2 by RT PCR: NEGATIVE

## 2023-07-18 LAB — GLUCOSE, CAPILLARY: Glucose-Capillary: 218 mg/dL — ABNORMAL HIGH (ref 70–99)

## 2023-07-18 MED ORDER — DIPHENHYDRAMINE HCL 25 MG PO CAPS: 25.0000 mg | ORAL_CAPSULE | Freq: Four times a day (QID) | ORAL | Status: DC | PRN

## 2023-07-18 MED ORDER — FLUTICASONE PROPIONATE 50 MCG/ACT NA SUSP: 2.0000 | Freq: Every day | NASAL | Status: DC | PRN

## 2023-07-18 MED ORDER — NITROGLYCERIN 0.4 MG SL SUBL: 0.4000 mg | SUBLINGUAL_TABLET | SUBLINGUAL | Status: DC | PRN

## 2023-07-18 MED ORDER — ASPIRIN 81 MG PO CHEW
81.0000 mg | CHEWABLE_TABLET | Freq: Every day | ORAL | Status: DC
  Administered 2023-07-19 – 2023-07-21 (×3): 81 mg via ORAL
  Filled 2023-07-18 (×3): qty 1

## 2023-07-18 MED ORDER — ALBUTEROL SULFATE (2.5 MG/3ML) 0.083% IN NEBU
2.5000 mg | INHALATION_SOLUTION | Freq: Four times a day (QID) | RESPIRATORY_TRACT | Status: DC | PRN
  Administered 2023-07-19 – 2023-07-22 (×5): 2.5 mg via RESPIRATORY_TRACT
  Filled 2023-07-18 (×4): qty 3

## 2023-07-18 MED ORDER — ENOXAPARIN SODIUM 40 MG/0.4ML IJ SOSY
40.0000 mg | PREFILLED_SYRINGE | INTRAMUSCULAR | Status: DC
  Administered 2023-07-19 – 2023-07-20 (×2): 40 mg via SUBCUTANEOUS
  Filled 2023-07-18 (×2): qty 0.4

## 2023-07-18 MED ORDER — FUROSEMIDE 20 MG PO TABS: 20.0000 mg | ORAL_TABLET | Freq: Every day | ORAL | Status: DC

## 2023-07-18 MED ORDER — PANTOPRAZOLE SODIUM 40 MG PO TBEC
40.0000 mg | DELAYED_RELEASE_TABLET | Freq: Two times a day (BID) | ORAL | Status: DC
  Administered 2023-07-19 – 2023-07-24 (×10): 40 mg via ORAL
  Filled 2023-07-18 (×10): qty 1

## 2023-07-18 MED ORDER — SODIUM CHLORIDE 0.9 % IV SOLN: Freq: Once | INTRAVENOUS | Status: AC

## 2023-07-18 MED ORDER — COLCHICINE 0.6 MG PO TABS: 0.6000 mg | ORAL_TABLET | ORAL | Status: DC | PRN

## 2023-07-18 MED ORDER — CLOPIDOGREL BISULFATE 75 MG PO TABS
75.0000 mg | ORAL_TABLET | Freq: Every day | ORAL | Status: DC
  Administered 2023-07-19 – 2023-07-21 (×3): 75 mg via ORAL
  Filled 2023-07-18 (×3): qty 1

## 2023-07-18 MED ORDER — LISINOPRIL 20 MG PO TABS: 40.0000 mg | ORAL_TABLET | Freq: Every day | ORAL | Status: DC

## 2023-07-18 MED ORDER — AMLODIPINE BESYLATE 10 MG PO TABS
10.0000 mg | ORAL_TABLET | Freq: Every day | ORAL | Status: DC
  Administered 2023-07-19 – 2023-07-23 (×5): 10 mg via ORAL
  Filled 2023-07-18 (×5): qty 1

## 2023-07-18 MED ORDER — METOPROLOL TARTRATE 50 MG PO TABS
50.0000 mg | ORAL_TABLET | Freq: Two times a day (BID) | ORAL | Status: DC
  Administered 2023-07-18 – 2023-07-24 (×11): 50 mg via ORAL
  Filled 2023-07-18 (×11): qty 1

## 2023-07-18 MED ORDER — VANCOMYCIN HCL IN DEXTROSE 1-5 GM/200ML-% IV SOLN
1000.0000 mg | Freq: Once | INTRAVENOUS | Status: AC
  Administered 2023-07-18: 1000 mg via INTRAVENOUS
  Filled 2023-07-18: qty 200

## 2023-07-18 MED ORDER — CLONIDINE HCL 0.1 MG PO TABS
0.1000 mg | ORAL_TABLET | Freq: Two times a day (BID) | ORAL | Status: DC
  Administered 2023-07-18 – 2023-07-24 (×11): 0.1 mg via ORAL
  Filled 2023-07-18 (×11): qty 1

## 2023-07-18 MED ORDER — MOMETASONE FURO-FORMOTEROL FUM 200-5 MCG/ACT IN AERO
2.0000 | INHALATION_SPRAY | Freq: Two times a day (BID) | RESPIRATORY_TRACT | Status: DC
  Administered 2023-07-19 – 2023-07-24 (×10): 2 via RESPIRATORY_TRACT
  Filled 2023-07-18: qty 8.8

## 2023-07-18 MED ORDER — SODIUM CHLORIDE 0.9 % IV SOLN: 2.0000 g | Freq: Once | INTRAVENOUS | Status: DC

## 2023-07-18 MED ORDER — ALPRAZOLAM 0.5 MG PO TABS
0.5000 mg | ORAL_TABLET | Freq: Every evening | ORAL | Status: DC | PRN
  Administered 2023-07-18 – 2023-07-21 (×3): 0.5 mg via ORAL
  Filled 2023-07-18 (×3): qty 1

## 2023-07-18 MED ORDER — ALLOPURINOL 100 MG PO TABS: 100.0000 mg | ORAL_TABLET | Freq: Every day | ORAL | Status: DC | PRN

## 2023-07-18 MED ORDER — MECLIZINE HCL 25 MG PO TABS: 12.5000 mg | ORAL_TABLET | Freq: Three times a day (TID) | ORAL | Status: DC | PRN

## 2023-07-18 MED ORDER — LACTATED RINGERS IV SOLN
150.0000 mL/h | INTRAVENOUS | Status: AC
  Administered 2023-07-19 (×2): 150 mL/h via INTRAVENOUS

## 2023-07-18 MED ORDER — ONDANSETRON HCL 4 MG/2ML IJ SOLN
4.0000 mg | Freq: Four times a day (QID) | INTRAMUSCULAR | Status: DC | PRN
  Administered 2023-07-19: 4 mg via INTRAVENOUS
  Filled 2023-07-18: qty 2

## 2023-07-18 MED ORDER — SODIUM CHLORIDE 0.9 % IV BOLUS
1000.0000 mL | Freq: Once | INTRAVENOUS | Status: AC
  Administered 2023-07-18: 1000 mL via INTRAVENOUS

## 2023-07-18 MED ORDER — LEVOTHYROXINE SODIUM 50 MCG PO TABS
50.0000 ug | ORAL_TABLET | Freq: Every day | ORAL | Status: DC
  Administered 2023-07-19 – 2023-07-24 (×5): 50 ug via ORAL
  Filled 2023-07-18 (×5): qty 1

## 2023-07-18 MED ORDER — ACETAMINOPHEN 325 MG PO TABS
650.0000 mg | ORAL_TABLET | Freq: Once | ORAL | Status: AC
  Administered 2023-07-18: 650 mg via ORAL
  Filled 2023-07-18: qty 2

## 2023-07-18 MED ORDER — ROSUVASTATIN CALCIUM 10 MG PO TABS
40.0000 mg | ORAL_TABLET | Freq: Every day | ORAL | Status: DC
  Administered 2023-07-19 – 2023-07-24 (×5): 40 mg via ORAL
  Filled 2023-07-18 (×5): qty 4

## 2023-07-18 MED ORDER — TAMSULOSIN HCL 0.4 MG PO CAPS
0.4000 mg | ORAL_CAPSULE | Freq: Every day | ORAL | Status: DC
  Administered 2023-07-18 – 2023-07-23 (×6): 0.4 mg via ORAL
  Filled 2023-07-18 (×6): qty 1

## 2023-07-18 MED ORDER — ISOSORBIDE MONONITRATE 20 MG PO TABS
10.0000 mg | ORAL_TABLET | Freq: Two times a day (BID) | ORAL | Status: DC
  Filled 2023-07-18: qty 1

## 2023-07-18 MED ORDER — BUDESONIDE-FORMOTEROL FUMARATE 80-4.5 MCG/ACT IN AERO: 2.0000 | INHALATION_SPRAY | Freq: Two times a day (BID) | RESPIRATORY_TRACT | Status: DC | PRN

## 2023-07-18 MED ORDER — TAMSULOSIN HCL 0.4 MG PO CAPS: 0.4000 mg | ORAL_CAPSULE | Freq: Every day | ORAL | Status: DC

## 2023-07-18 MED ORDER — ETHACRYNIC ACID 25 MG PO TABS
50.0000 mg | ORAL_TABLET | Freq: Two times a day (BID) | ORAL | Status: DC
  Administered 2023-07-19 (×2): 50 mg via ORAL
  Filled 2023-07-18 (×7): qty 2

## 2023-07-18 MED ORDER — SODIUM CHLORIDE 0.9 % IV SOLN
2.0000 g | INTRAVENOUS | Status: AC
  Administered 2023-07-19 – 2023-07-23 (×5): 2 g via INTRAVENOUS
  Filled 2023-07-18 (×6): qty 20

## 2023-07-18 MED ORDER — ONDANSETRON HCL 4 MG PO TABS: 4.0000 mg | ORAL_TABLET | Freq: Four times a day (QID) | ORAL | Status: DC | PRN

## 2023-07-18 MED ORDER — AMMONIUM LACTATE 12 % EX LOTN
1.0000 | TOPICAL_LOTION | Freq: Two times a day (BID) | CUTANEOUS | Status: DC
  Administered 2023-07-19 – 2023-07-24 (×10): 1 via TOPICAL
  Filled 2023-07-18 (×2): qty 400

## 2023-07-18 MED ORDER — SODIUM CHLORIDE 0.9 % IV SOLN
2.0000 g | Freq: Once | INTRAVENOUS | Status: AC
  Administered 2023-07-18: 2 g via INTRAVENOUS
  Filled 2023-07-18: qty 12.5

## 2023-07-18 MED ORDER — OMEGA-3-ACID ETHYL ESTERS 1 G PO CAPS
1.0000 g | ORAL_CAPSULE | Freq: Every day | ORAL | Status: DC
  Administered 2023-07-19 – 2023-07-24 (×5): 1 g via ORAL
  Filled 2023-07-18 (×5): qty 1

## 2023-07-18 NOTE — ED Notes (Signed)
Family presents to first nurse requesting a blanket for pt, pts family member educated on pt having a fever and that it was best we did not give him one, family member insisted that we has one NOW. Pt also wearing multiple shirts, jacket and hat.

## 2023-07-18 NOTE — Consult Note (Signed)
PHARMACY -  BRIEF ANTIBIOTIC NOTE   Pharmacy has received consult(s) for cefepime and vancomycin from an ED provider.  The patient's profile has been reviewed for ht/wt/allergies/indication/available labs.    One time order(s) placed for  --Cefepime 2 g IV --Vancomycin 1 g IV  Further antibiotics/pharmacy consults should be ordered by admitting physician if indicated.                       Thank you, Tressie Ellis 07/18/2023  4:30 PM

## 2023-07-18 NOTE — ED Notes (Signed)
Wife states their son has covid and has been around the pt.

## 2023-07-18 NOTE — H&P (Signed)
History and Physical    Patient: Roberto Ingram:[id number] DOB: 28-Aug-1951 DOA: 07/18/2023 DOS: the patient was seen and examined on 07/18/2023 PCP: Erasmo Downer, NP  Patient coming from: Home  Chief Complaint:  Chief Complaint  Patient presents with   Flu Like Symptoms   Weakness   HPI: Roberto Ingram is a 72 y.o. male with medical history significant of coronary artery disease, type 2 diabetes, GERD, BPH, asthmatic bronchitis, history of GI bleed, essential hypertension, hypothyroidism, mixed hyperlipidemia, thyroid disease, recent GI tumor status post removal at [facility] who presents to the ER with generalized weakness, sore throat, dry mouth.  Symptoms have been going on for several days now.  Patient also has diffuse bodyaches.  Has had weakness and decreased appetite.  He has not eaten much.  Wife with the patient who said symptoms have been getting progressively worse since he had the GI stromal tumor removal about a month and a half ago.  Patient has been recovering well.  He however did have some complication of catheter apparently at the time.  Patient also had some recent lymph node biopsy of the axillary region.  He had findings that are consistent with sepsis.  Urinalysis suggestive of possible UTI but patient has been on Septra.  He appears dehydrated with hyponatremia and AKI.  At this point patient is being admitted to the hospital for management of sepsis due to UTI.  Review of Systems: As mentioned in the history of present illness. All other systems reviewed and are negative. Past Medical History:  Diagnosis Date   Anemia    Anxiety    Generalized anxiety disorder   Asthmatic bronchitis    Atopic dermatitis    BPH (benign prostatic hyperplasia)    Cardiovascular disease    Carotid disease, bilateral (HCC)    Colon polyp    Constipation    Coronary artery disease    Diabetes mellitus without complication (HCC)    Diabetic retinopathy (HCC)    GERD  (gastroesophageal reflux disease)    Glaucoma of left eye    Gout    History of GI bleed    Hypercholesterolemia    Hypertension    Hypomagnesemia    Hypothyroidism    Insomnia    Mixed hyperlipidemia    Myocardial infarction (HCC)    Pneumonia    Renal disorder    Renal insufficiency    Sleep apnea    wears oxygen at night (unable to tolerate CPAP)   Stroke (HCC)    Stroke [facility])    Thyroid disease    Vitamin D deficiency    Past Surgical History:  Procedure Laterality Date   COLONOSCOPY     COLONOSCOPY WITH PROPOFOL N/A 02/24/2018   Procedure: COLONOSCOPY WITH PROPOFOL;  Surgeon: Toledo, Boykin Nearing, MD;  Location: [facility] ENDOSCOPY;  Service: Gastroenterology;  Laterality: N/A;   COLONOSCOPY WITH PROPOFOL N/A 08/06/2022   Procedure: COLONOSCOPY WITH PROPOFOL;  Surgeon: Regis Bill, MD;  Location: [facility] ENDOSCOPY;  Service: Gastroenterology;  Laterality: N/A;   CORONARY ANGIOPLASTY     CORONARY ARTERY BYPASS GRAFT     ESOPHAGOGASTRODUODENOSCOPY     EXPLORATION POST OPERATIVE OPEN HEART     EYE SURGERY     Lens eye surgery   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 His smoking use included cigarettes. He has never used smokeless tobacco. He reports that he does not drink alcohol and does not use drugs.  Allergies  Allergen Reactions   Antihistamines, Chlorpheniramine-Type Swelling    Prostat   Diphenhydramine Hcl Swelling   Fluorescein Dermatitis, Hives, Itching and Other (See Comments)   Iodinated Contrast Media Other (See Comments)   Lisinopril Swelling   Ezetimibe Hives and Other (See Comments)   Furosemide Swelling   Metformin Hives and Other (See Comments)   Metoprolol Itching   Metoprolol Tartrate Itching   Nsaids Other (See Comments)    Does take aspirin   Sulfa Antibiotics Hives  and Other (See Comments)   Brilinta [Ticagrelor] Other (See Comments)    Headaches, off balance, no appetite   Diphenhydramine Hcl Other (See Comments)    "The more I take, it makes my prostate swell..."   Other Itching and Other (See Comments)    FA Dye Prostate swelling FA Dye  Prostate swelling    Family History  Problem Relation Age of Onset   Atrial fibrillation Mother    Heart disease Mother    Stroke Mother    Bell's palsy Mother    Kidney disease Mother    Cancer Mother    Diabetes Father    Arthritis Father    Stroke Brother    Diabetes Brother    Lung cancer Brother    Heart attack Brother    Cancer Brother    Heart disease Sister    COPD Sister    Throat cancer Sister     Prior to Admission medications   Medication Sig Start Date End Date Taking? Authorizing Provider  ammonium lactate (AMLACTIN) 12 % cream Apply 1 Application topically 2 (two) times daily. Apply to arms, legs, and body one to two times daily 06/12/23  Yes [provider]  cloNIDine (CATAPRES) 0.1 MG tablet Take 0.1 mg by mouth 2 (two) times daily. 07/09/23  Yes [provider]  colchicine 0.6 MG tablet Take 0.6 mg by mouth as needed (gout flares). 05/13/23  Yes [provider]  ethacrynic acid (EDECRIN) 25 MG tablet Take 50 mg by mouth 2 (two) times daily. 06/21/23  Yes [provider]  furosemide (LASIX) 20 MG tablet Take 20 mg by mouth daily. 05/21/23  Yes [provider]  LANTUS SOLOSTAR 100 UNIT/ML Solostar Pen Inject 68 Units into the skin daily. 07/03/23  Yes [provider]  pantoprazole (PROTONIX) 40 MG tablet Take 40 mg by mouth 2 (two) times daily. 06/21/23  Yes [provider]  sulfamethoxazole-trimethoprim (BACTRIM DS) 800-160 MG tablet Take 1 tablet by mouth 2 (two) times daily. 07/17/23  Yes [provider]  albuterol (PROVENTIL HFA;VENTOLIN HFA) 108 (90 Base) MCG/ACT inhaler Inhale 2 puffs into the lungs every 6 (six)  hours as needed for wheezing or shortness of breath.     [provider]  allopurinol (ZYLOPRIM) 100 MG tablet Take 100 mg by mouth daily as needed (gout).     [provider]  ALPRAZolam Prudy Feeler) 0.5 MG tablet Take 0.5 mg by mouth at bedtime as needed for sleep.     [provider]  amLODipine (NORVASC) 10 MG tablet Take 10 mg by mouth daily.    [provider]  aspirin 81 MG chewable tablet Chew 81 mg by mouth daily.     [provider]  Budesonide-Formoterol Fumarate (SYMBICORT IN) Inhale 1 puff into the lungs 2 (two)  times daily as needed (shortness of breath).    [provider]  clopidogrel (PLAVIX) 75 MG tablet Take 75 mg by mouth daily.    [provider]  diphenhydrAMINE (BENADRYL ALLERGY) 25 mg capsule Take 1 capsule (25 mg total) by mouth every 6 (six) hours as needed. 05/23/23   Dionne Bucy, MD  fluticasone (FLONASE) 50 MCG/ACT nasal spray Place 2 sprays into both nostrils daily as needed for allergies or rhinitis.    [provider]  fluticasone-salmeterol (ADVAIR) 250-50 MCG/ACT AEPB Inhale 1 puff into the lungs 2 (two) times daily.    [provider]  insulin detemir (LEVEMIR FLEXPEN) 100 UNIT/ML FlexPen Inject 68 Units into the skin daily at 12 noon. 08/23/21   [provider]  insulin regular (NOVOLIN R,HUMULIN R) 100 units/mL injection Inject 12 to 26 units subcutaneously three times a day before each meal per sliding scale 07/10/16   [provider]  isosorbide mononitrate (ISMO) 10 MG tablet Take 10 mg by mouth 2 (two) times daily.    [provider]  levothyroxine (SYNTHROID) 50 MCG tablet Take 50 mcg by mouth daily before breakfast.  01/12/14   [provider]  lisinopril (PRINIVIL,ZESTRIL) 40 MG tablet Take 40 mg by mouth daily.     [provider]  meclizine (ANTIVERT) 12.5 MG tablet Take 1 tablet (12.5 mg total) by mouth 3 (three) times daily as  needed for dizziness. 04/09/18   Mackuen, Courteney Lyn, MD  metoprolol tartrate (LOPRESSOR) 25 MG tablet Take 25 mg by mouth 2 (two) times daily.  03/28/14   [provider]  nitroGLYCERIN (NITROSTAT) 0.4 MG SL tablet Place 0.4 mg under the tongue every 5 (five) minutes as needed for chest pain.     [provider]  Omega-3 Fatty Acids (FISH OIL) 1000 MG CAPS Take 1,000 mg by mouth daily.     [provider]  rosuvastatin (CRESTOR) 40 MG tablet Take 40 mg by mouth daily.  03/29/14   [provider]  tamsulosin (FLOMAX) 0.4 MG CAPS capsule Take 0.4 mg by mouth daily after supper.     [provider]  Vitamin D, Ergocalciferol, (DRISDOL) 50000 units CAPS capsule Take 1 tablet by mouth twice each week    [provider]  mometasone (NASONEX) 50 MCG/ACT nasal spray Place into the nose.  09/11/16  [provider]    Physical Exam: Vitals:   07/18/23 1345 07/18/23 1725 07/18/23 1909 07/18/23 2028  BP:   (!) 127/49 (!) 129/58  Pulse:   63 68  Resp:   16 16  Temp:  99.1 F (37.3 C)  98.4 F (36.9 C)  TempSrc:  Oral  Oral  SpO2: 96%  98% 99%  Weight:      Height:    6' (1.829 m)   Constitutional: Acutely ill looking no distress NAD, calm, comfortable Eyes: PERRL, lids and conjunctivae normal ENMT: Mucous membranes are dry. Posterior pharynx clear of any exudate or lesions.Normal dentition.  Neck: normal, supple, no masses, no thyromegaly Respiratory: clear to auscultation bilaterally, no wheezing, no crackles. Normal respiratory effort. No accessory muscle use.  Cardiovascular: Regular rate and rhythm, no murmurs / rubs / gallops. No extremity edema. 2+ pedal pulses. No carotid bruits.  Abdomen: no tenderness, no masses palpated. No hepatosplenomegaly. Bowel sounds positive.  Musculoskeletal: Good range of motion, no joint swelling or tenderness, Skin: no rashes, lesions, ulcers. No induration Neurologic: CN 2-12 grossly intact.  Sensation intact, DTR normal. Strength 5/5  in all 4.  Psychiatric: Normal judgment and insight. Alert and oriented x 3. Normal mood  Data Reviewed:  Sodium 129, chloride 96, glucose 257, BUN 34 creatinine 2.75 and calcium 8.4.  Albumin 2.6.  White count is 13.6 hemoglobin 9.7 platelets 345.  COVID-19 screen is negative.  Urinalysis essentially negative but again patient has had recent antibiotics.  Group A strep is also negative.  Assessment and Plan:  #1 sepsis: Patient meets sepsis criteria with temperature and white count of 13.6.  Suspected UTI that was recently treated.  Other possible causes could be intra-abdominal as patient has had recent stromal cancer removal.  Will admit the patient.  Empiric antibiotics.  Supportive care with fluids and PT.  #2 hyponatremia: Most likely reflects dehydration.  Continue to monitor.  #3 AKI: Continue to monitor renal function.  Hydrate aggressively  #4 diabetes: Continue Lantus and sliding scale insulin  #5 essential hypertension: Continue home blood pressure medications.  #6 anemia of chronic disease: H&H is stable  #7 hypothyroidism: Continue levothyroxine  #8 obstructive sleep apnea: Continue CPAP at night  #9 history of CVA: Stable with no significant residuals.    Advance Care Planning:   Code Status: Full Code   Consults: None  Family Communication: Wife at bedside  Severity of Illness: The appropriate patient status for this patient is INPATIENT. Inpatient status is judged to be reasonable and necessary in order to provide the required intensity of service to ensure the patient's safety. The patient's presenting symptoms, physical exam findings, and initial radiographic and laboratory data in the context of their chronic comorbidities is felt to place them at high risk for further clinical deterioration. Furthermore, it is not anticipated that the patient will be medically stable for discharge from the hospital within 2 midnights  of admission.   * I certify that at the point of admission it is my clinical judgment that the patient will require inpatient hospital care spanning beyond 2 midnights from the point of admission due to high intensity of service, high risk for further deterioration and high frequency of surveillance required.*  AuthorLonia Blood, MD 07/18/2023 9:47 PM  For on call review [email or web address].

## 2023-07-18 NOTE — ED Provider Notes (Signed)
[facility] Provider Note    Event Date/Time   First MD Initiated Contact with Patient 07/18/23 1545     (approximate)   History   Flu Like Symptoms and Weakness   HPI  Roberto Ingram is a 72 y.o. male here with generalized weakness, sore throat, dry mouth.  The patient states that for the last several days, he separatively worsening generalized weakness.  He said diffuse bodyaches.  He has had a sore throat and dry mouth.  He has had weakness, decreased appetite, and does not want to eat or drink.  He has had a cough.  He said some mild shortness of breath.  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  No abdominal pain.  No diarrhea.  He also had a lymph node biopsy of a left axillary lymph node several days ago, has not had any pain or redness at the site.     Physical Exam   Triage Vital Signs: ED Triage Vitals  Encounter Vitals Group     BP 07/18/23 1340 133/63     Systolic BP Percentile --      Diastolic BP Percentile --      Pulse Rate 07/18/23 1340 72     Resp 07/18/23 1340 20     Temp 07/18/23 1343 98.1 F (36.7 C)     Temp Source 07/18/23 1343 Oral     SpO2 07/18/23 1345 96 %     Weight 07/18/23 1341 214 lb 1.1 oz (97.1 kg)     Height 07/18/23 1341 6' (1.829 m)     Head Circumference --      Peak Flow --      Pain Score 07/18/23 1341 8     Pain Loc --      Pain Education --      Exclude from Growth Chart --     Most recent vital signs: Vitals:   07/18/23 1725 07/18/23 1909  BP:  (!) 127/49  Pulse:  63  Resp:  16  Temp: 99.1 F (37.3 C)   SpO2:  98%     General: Awake, no distress.  Well-appearing. CV:  Good peripheral perfusion.  Regular rate and rhythm.  No murmurs. Resp:  Normal work of breathing.  Mild rales noted bibasilar lung fields, no wheezing. Abd:  No distention.  No tenderness. Other:  Left axillary lymph node biopsy site clean, dry, intact.   ED Results  / Procedures / Treatments   Labs (all labs ordered are listed, but only abnormal results are displayed) Labs Reviewed  BASIC METABOLIC PANEL - Abnormal; Notable for the following components:      Result Value   Sodium 129 (*)    Chloride 96 (*)    Glucose, Bld 257 (*)    BUN 34 (*)    Creatinine, Ser 2.75 (*)    Calcium 8.4 (*)    GFR, Estimated 24 (*)    All other components within normal limits  CBC - Abnormal; Notable for the following components:   WBC 13.6 (*)    RBC 3.65 (*)    Hemoglobin 9.7 (*)    HCT 31.2 (*)    RDW 15.9 (*)    All other components within normal limits  URINALYSIS, ROUTINE W REFLEX MICROSCOPIC - Abnormal; Notable for the following components:   Color, Urine YELLOW (*)    APPearance CLOUDY (*)  Protein, ur 100 (*)    All other components within normal limits  HEPATIC FUNCTION PANEL - Abnormal; Notable for the following components:   Albumin 2.6 (*)    All other components within normal limits  SARS CORONAVIRUS 2 BY RT PCR  GROUP A STREP BY PCR  CULTURE, BLOOD (ROUTINE X 2)  CULTURE, BLOOD (ROUTINE X 2)  LACTIC ACID, PLASMA  LACTIC ACID, PLASMA  LIPASE, BLOOD  CBG MONITORING, ED     EKG Normal sinus rhythm, ventricular rate 72.  PR 158, QRS 78, QTc 392.  No acute ST elevations or depression acute events of acute ischemia or infarct.   RADIOLOGY CT chest/abdomen/pelvis: No acute airspace disease, diffuse adenopathy, bilateral perinephric stranding   I also independently reviewed and agree with radiologist interpretations.   PROCEDURES:  Critical Care performed: No  .1-3 Lead EKG Interpretation  Performed by: Shaune Pollack, MD Authorized by: Shaune Pollack, MD     Interpretation: normal     ECG rate:  70-90   ECG rate assessment: normal     Rhythm: sinus rhythm     Ectopy: none     Conduction: normal   Comments:     Indication: SOB    MEDICATIONS ORDERED IN ED: Medications  0.9 %  sodium chloride infusion (has no  administration in time range)  sodium chloride 0.9 % bolus 1,000 mL (0 mLs Intravenous Stopped 07/18/23 1909)  acetaminophen (TYLENOL) tablet 650 mg (650 mg Oral Given 07/18/23 1652)  ceFEPIme (MAXIPIME) 2 g in sodium chloride 0.9 % 100 mL IVPB (0 g Intravenous Stopped 07/18/23 1802)  vancomycin (VANCOCIN) IVPB 1000 mg/200 mL premix (0 mg Intravenous Stopped 07/18/23 1909)     IMPRESSION / MDM / ASSESSMENT AND PLAN / ED COURSE  I reviewed the triage vital signs and the nursing notes.                              Differential diagnosis includes, but is not limited to: PNA, CHF, AFIb, valvular disorder,   Patient's presentation is most consistent with acute presentation with potential threat to life or bodily function.  The patient is on the cardiac monitor to evaluate for evidence of arrhythmia and/or significant heart rate changes   72 year old male with past medical history hypertension, GERD, hyperlipidemia, GI stromal tumor, here with generalized weakness and flulike symptoms.  Patient was recently diagnosed with UTI and is on Bactrim.  Suspect dehydration and possible ongoing UTI.  Chest x-ray is clear.  UA is unremarkable although as mentioned he has been partially treated.  CBC shows leukocytosis.  Lactic acid normal.  CT chest/abdomen/pelvis obtained given unclear source, does show some perinephric stranding which would suggest possible upper urinary tract infection, though no other acute surgical abnormality.  He does have a diffuse lymphadenopathy which was recently biopsied.  Patient given empiric broad-spectrum antibiotics for his fever, leukocytosis, and concern for infection, will admit to medicine.   FINAL CLINICAL IMPRESSION(S) / ED DIAGNOSES   Final diagnoses:  Weakness  Dehydration  Sepsis, due to unspecified organism, unspecified whether acute organ dysfunction present [facility])     Rx / DC Orders   ED Discharge Orders     None        Note:  This document was prepared  using Dragon voice recognition software and may include unintentional dictation errors.   Shaune Pollack, MD 07/18/23 [phone]

## 2023-07-18 NOTE — ED Triage Notes (Signed)
Pt here with flu like symptoms. Pt had an abd surgery and states everything is healing well but he is more fatigued than usual. Pt states he has been having 101 fevers for a few days but no fever today. Pt had biopsies done on his lymph nodes a few days ago. Pt was also started on a prophylactic abx, Bactrim, pt started on yesterday, has taken 3 pills so far.

## 2023-07-19 ENCOUNTER — Encounter: Payer: Self-pay | Admitting: Internal Medicine

## 2023-07-19 DIAGNOSIS — N179 Acute kidney failure, unspecified: Secondary | ICD-10-CM | POA: Diagnosis not present

## 2023-07-19 DIAGNOSIS — G4733 Obstructive sleep apnea (adult) (pediatric): Secondary | ICD-10-CM | POA: Diagnosis not present

## 2023-07-19 DIAGNOSIS — A419 Sepsis, unspecified organism: Secondary | ICD-10-CM | POA: Diagnosis not present

## 2023-07-19 DIAGNOSIS — I10 Essential (primary) hypertension: Secondary | ICD-10-CM | POA: Diagnosis not present

## 2023-07-19 LAB — COMPREHENSIVE METABOLIC PANEL
ALT: 16 U/L (ref 0–44)
AST: 17 U/L (ref 15–41)
Albumin: 2.4 g/dL — ABNORMAL LOW (ref 3.5–5.0)
Alkaline Phosphatase: 58 U/L (ref 38–126)
Anion gap: 8 (ref 5–15)
BUN: 30 mg/dL — ABNORMAL HIGH (ref 8–23)
CO2: 23 mmol/L (ref 22–32)
Calcium: 7.8 mg/dL — ABNORMAL LOW (ref 8.9–10.3)
Chloride: 97 mmol/L — ABNORMAL LOW (ref 98–111)
Creatinine, Ser: 2.8 mg/dL — ABNORMAL HIGH (ref 0.61–1.24)
GFR, Estimated: 23 mL/min — ABNORMAL LOW (ref >60)
Glucose, Bld: 265 mg/dL — ABNORMAL HIGH (ref 70–99)
Potassium: 4.7 mmol/L (ref 3.5–5.1)
Sodium: 128 mmol/L — ABNORMAL LOW (ref 135–145)
Total Bilirubin: 0.5 mg/dL (ref 0.3–1.2)
Total Protein: 6.8 g/dL (ref 6.5–8.1)

## 2023-07-19 LAB — CORTISOL-AM, BLOOD: Cortisol - AM: 19.7 ug/dL (ref 6.7–22.6)

## 2023-07-19 LAB — PROTIME-INR
INR: 1.3 — ABNORMAL HIGH (ref 0.8–1.2)
Prothrombin Time: 16.7 s — ABNORMAL HIGH (ref 11.4–15.2)

## 2023-07-19 LAB — GLUCOSE, CAPILLARY
Glucose-Capillary: 261 mg/dL — ABNORMAL HIGH (ref 70–99)
Glucose-Capillary: 261 mg/dL — ABNORMAL HIGH (ref 70–99)
Glucose-Capillary: 245 mg/dL — ABNORMAL HIGH (ref 70–99)

## 2023-07-19 LAB — CBC
HCT: 28.4 % — ABNORMAL LOW (ref 39.0–52.0)
Hemoglobin: 8.9 g/dL — ABNORMAL LOW (ref 13.0–17.0)
MCH: 26.3 pg (ref 26.0–34.0)
MCHC: 31.3 g/dL (ref 30.0–36.0)
MCV: 83.8 fL (ref 80.0–100.0)
Platelets: 353 10*3/uL (ref 150–400)
RBC: 3.39 MIL/uL — ABNORMAL LOW (ref 4.22–5.81)
RDW: 15.9 % — ABNORMAL HIGH (ref 11.5–15.5)
WBC: 12.9 10*3/uL — ABNORMAL HIGH (ref 4.0–10.5)
nRBC: 0 % (ref 0.0–0.2)

## 2023-07-19 LAB — HEMOGLOBIN A1C
Hgb A1c MFr Bld: 7.8 % — ABNORMAL HIGH (ref 4.8–5.6)
Mean Plasma Glucose: 177.16 mg/dL

## 2023-07-19 LAB — PROCALCITONIN: Procalcitonin: 0.54 ng/mL

## 2023-07-19 MED ORDER — ALUM & MAG HYDROXIDE-SIMETH 200-200-20 MG/5ML PO SUSP
30.0000 mL | ORAL | Status: DC | PRN
  Administered 2023-07-19 – 2023-07-21 (×3): 30 mL via ORAL
  Filled 2023-07-19 (×3): qty 30

## 2023-07-19 MED ORDER — ENSURE ENLIVE PO LIQD
237.0000 mL | Freq: Two times a day (BID) | ORAL | Status: DC
  Administered 2023-07-19 – 2023-07-20 (×3): 237 mL via ORAL

## 2023-07-19 MED ORDER — INSULIN ASPART 100 UNIT/ML IJ SOLN
0.0000 [IU] | Freq: Three times a day (TID) | INTRAMUSCULAR | Status: DC
  Administered 2023-07-19: 5 [IU] via SUBCUTANEOUS
  Administered 2023-07-20 (×3): 2 [IU] via SUBCUTANEOUS
  Administered 2023-07-21 – 2023-07-22 (×3): 1 [IU] via SUBCUTANEOUS
  Administered 2023-07-23: 7 [IU] via SUBCUTANEOUS
  Administered 2023-07-23: 5 [IU] via SUBCUTANEOUS
  Administered 2023-07-23: 7 [IU] via SUBCUTANEOUS
  Administered 2023-07-24 (×2): 2 [IU] via SUBCUTANEOUS
  Filled 2023-07-19 (×12): qty 1

## 2023-07-19 MED ORDER — INSULIN GLARGINE-YFGN 100 UNIT/ML ~~LOC~~ SOLN
20.0000 [IU] | Freq: Every day | SUBCUTANEOUS | Status: DC
  Administered 2023-07-19 – 2023-07-20 (×2): 20 [IU] via SUBCUTANEOUS
  Filled 2023-07-19 (×2): qty 0.2

## 2023-07-19 MED ORDER — INSULIN ASPART 100 UNIT/ML IJ SOLN
0.0000 [IU] | Freq: Every day | INTRAMUSCULAR | Status: DC
  Administered 2023-07-19 – 2023-07-23 (×3): 2 [IU] via SUBCUTANEOUS
  Filled 2023-07-19 (×3): qty 1

## 2023-07-19 MED ORDER — BISACODYL 5 MG PO TBEC
10.0000 mg | DELAYED_RELEASE_TABLET | Freq: Every day | ORAL | Status: DC
  Administered 2023-07-19 – 2023-07-23 (×3): 10 mg via ORAL
  Filled 2023-07-19 (×5): qty 2

## 2023-07-19 MED ORDER — GLUCERNA SHAKE PO LIQD
237.0000 mL | Freq: Two times a day (BID) | ORAL | Status: DC
  Administered 2023-07-19 – 2023-07-21 (×5): 237 mL via ORAL

## 2023-07-19 MED ORDER — ACETAMINOPHEN 325 MG PO TABS
650.0000 mg | ORAL_TABLET | Freq: Four times a day (QID) | ORAL | Status: DC | PRN
  Administered 2023-07-19 – 2023-07-24 (×11): 650 mg via ORAL
  Filled 2023-07-19 (×11): qty 2

## 2023-07-19 MED ORDER — SENNOSIDES-DOCUSATE SODIUM 8.6-50 MG PO TABS
1.0000 | ORAL_TABLET | Freq: Two times a day (BID) | ORAL | Status: DC
  Administered 2023-07-19 – 2023-07-23 (×5): 1 via ORAL
  Filled 2023-07-19 (×10): qty 1

## 2023-07-19 NOTE — Progress Notes (Signed)
[facility]  - [place] [facility]   PATIENT NAME: Roberto Ingram    MR#:  [id number]  DATE OF BIRTH:  June 25, 1951  SUBJECTIVE:   Wife at bedside. Patient apparently came in with generalized weakness poor PO intake debility since his G.I. stromal resection and liver biopsy from left axillary area and [facility] per wife. Patient had fever 102 today. Poor PO intake.   VITALS:  Blood pressure (!) 129/44, pulse 69, temperature 98.9 F (37.2 C), temperature source Oral, resp. rate 16, height 6' (1.829 m), weight 97.5 kg, SpO2 99%.  PHYSICAL EXAMINATION:   GENERAL:  72 y.o.-year-old patient with no acute distress. Weak deconditioned LUNGS: Normal breath sounds bilaterally, no wheezing CARDIOVASCULAR: S1, S2 normal. No murmur   ABDOMEN: Soft, nontender, nondistended. Bowel sounds present.  EXTREMITIES: No  edema b/l.    NEUROLOGIC: nonfocal  patient is alert and awake SKIN: No obvious rash, lesion, or ulcer.   LABORATORY PANEL:  CBC Recent Labs  Lab 07/19/23 1016  WBC 12.9*  HGB 8.9*  HCT 28.4*  PLT 353    Chemistries  Recent Labs  Lab 07/19/23 1016  NA 128*  K 4.7  CL 97*  CO2 23  GLUCOSE 265*  BUN 30*  CREATININE 2.80*  CALCIUM 7.8*  AST 17  ALT 16  ALKPHOS 58  BILITOT 0.5   Cardiac Enzymes No results for input(s): "TROPONINI" in the last 168 hours. RADIOLOGY:  CT CHEST ABDOMEN PELVIS WO CONTRAST  Result Date: 07/18/2023 CLINICAL DATA:  Sepsis flu like symptoms abdominal surgery fever EXAM: CT CHEST, ABDOMEN AND PELVIS WITHOUT CONTRAST TECHNIQUE: Multidetector CT imaging of the chest, abdomen and pelvis was performed following the standard protocol without IV contrast. RADIATION DOSE REDUCTION: This exam was performed according to the departmental dose-optimization program which includes automated exposure control, adjustment of the mA and/or kV according to patient size and/or use of iterative reconstruction technique. COMPARISON:  CT 06/23/2023  FINDINGS: CT CHEST FINDINGS Cardiovascular: Limited assessment without intravenous contrast. Moderate aortic atherosclerosis. Post CABG changes. Coronary vascular calcifications. Borderline cardiomegaly. No pericardial effusion. Mediastinum/Nodes: Midline trachea. No thyroid mass. Enlarged prevascular lymph node measuring 17 mm series 2, image 16 increased compared to prior. Mild diffuse enlargement of multiple prevascular nodes. Esophagus within normal limits. Multiple enlarged left axillary lymph nodes with dominant left subpectoral node measuring 5.7 x 3.9 cm, previously 4.3 x 3.5 cm. Interval enlargement of multiple additional left axillary nodes with associated soft tissue stranding. Incompletely visualized left supraclavicular mass measuring at least 6.9 by 5.3 cm, series 2, image 1, also increased compared to prior. Lungs/Pleura: Emphysema. No acute airspace disease, pleural effusion, or pneumothorax. No suspicious pulmonary nodules. Musculoskeletal: Sternotomy.  No acute osseous abnormality. CT ABDOMEN PELVIS FINDINGS Hepatobiliary: No focal liver abnormality is seen. No gallstones, gallbladder wall thickening, or biliary dilatation. Pancreas: Unremarkable. No pancreatic ductal dilatation or surrounding inflammatory changes. Spleen: Normal in size without focal abnormality. Adrenals/Urinary Tract: Adrenal glands are within normal limits. Moderate nonspecific perinephric fat stranding. No hydronephrosis. The bladder is unremarkable. Stomach/Bowel: Postsurgical changes of the stomach. No dilated small bowel. No acute bowel wall thickening. Negative appendix. Vascular/Lymphatic: Moderate aortic atherosclerosis. Increased size of right Peri aortic retroperitoneal lymph node on series 2, image 52 measuring 1.6 cm. Multiple subcentimeter Peri aortic nodes mildly increased. Slight interval increase in size of multiple subcentimeter iliac nodes. Multiple enlarged right pelvic sidewall and external iliac nodes, for  example 2 cm lymph node on series 2, image 105. Right external iliac  node measuring 3.1 cm on series 2, image 110, previously 2.3 cm. Ill-defined right groin mass measuring 4.1 cm, previously 4.6 cm. Multiple additional mildly enlarged left groin lymph nodes. Reproductive: Slightly enlarged prostate Other: Negative for pelvic effusion or free air. Skin thickening and subcutaneous infiltration along the anterior abdominal wall possibly due to cutaneous injection. Musculoskeletal: No acute or suspicious osseous abnormality IMPRESSION: 1. Negative for acute airspace disease.  Emphysema. 2. Increased left axillary, mediastinal, and retroperitoneal adenopathy. Increased size of incompletely visualized left supraclavicular mass/suspected adenopathy. Persistent right pelvic, external iliac and right groin adenopathy. Findings could be secondary to metastatic disease or lymphoproliferative disease. Tissue sampling if not already performed is suggested. 3. Nonspecific bilateral perinephric stranding without hydronephrosis. No convincing CT evidence for acute intra-abdominal or pelvic abnormality. Aortic Atherosclerosis (ICD10-I70.0) and Emphysema (ICD10-J43.9). Electronically Signed   By: Jasmine Pang M.D.   On: 07/18/2023 18:07    Assessment and Plan  Roberto Ingram is a 72 y.o. male with medical history significant of coronary artery disease, type 2 diabetes, GERD, BPH, asthmatic bronchitis, history of GI bleed, essential hypertension, hypothyroidism, mixed hyperlipidemia, thyroid disease, recent GI tumor status post removal at [facility] who presents to the ER with generalized weakness, sore throat, dry mouth.  Symptoms have been going on for several days now.  Patient also has diffuse bodyaches.  Has had weakness and decreased appetite.  He has not eaten much.   Urinalysis suggestive of possible UTI but patient has been on Septra.  He appears dehydrated with hyponatremia and AKI.  At this point patient is  being admitted to the hospital for management of sepsis due to UTI.  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 at [facility] history of lymphadenopathy status post left axillary lymph node biopsy -- patient follows with oncology at [facility]  Hypertension -- continue home meds  Type II diabetes, hyperglycemia, CKD stage IIIB -Will resume long-acting insulin and sliding scale and adjust insulin dosage according to sugars  Anemia of chronic disease -- hemoglobin stable  Hypothyroidism -- on Synthroid  Obstructive sleep apnea -- continue CPAP  Generalized weakness with debility -- PT OT to see patient      Procedures: Family communication : wife at bedside Consults : none CODE STATUS: full DVT Prophylaxis : enoxaparin Level of care: Telemetry Medical Status is: Inpatient Remains inpatient appropriate because: treatment for sepsis    TOTAL TIME TAKING CARE OF THIS PATIENT: 35 minutes.  >50% time spent on counselling and coordination of care  Note: This dictation was prepared with Dragon dictation along with smaller phrase technology. Any transcriptional errors that result from this process are unintentional.  Enedina Finner M.D    [facility]   CC: Primary care physician; Erasmo Downer, NP
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
## 2023-07-19 NOTE — Plan of Care (Signed)

## 2023-07-20 DIAGNOSIS — I10 Essential (primary) hypertension: Secondary | ICD-10-CM | POA: Diagnosis not present

## 2023-07-20 DIAGNOSIS — N390 Urinary tract infection, site not specified: Secondary | ICD-10-CM

## 2023-07-20 DIAGNOSIS — E785 Hyperlipidemia, unspecified: Secondary | ICD-10-CM

## 2023-07-20 DIAGNOSIS — G4733 Obstructive sleep apnea (adult) (pediatric): Secondary | ICD-10-CM

## 2023-07-20 DIAGNOSIS — E1169 Type 2 diabetes mellitus with other specified complication: Secondary | ICD-10-CM

## 2023-07-20 DIAGNOSIS — A419 Sepsis, unspecified organism: Secondary | ICD-10-CM | POA: Diagnosis not present

## 2023-07-20 DIAGNOSIS — N179 Acute kidney failure, unspecified: Secondary | ICD-10-CM

## 2023-07-20 LAB — RESPIRATORY PANEL BY PCR

## 2023-07-20 LAB — BASIC METABOLIC PANEL
Anion gap: 7 (ref 5–15)
BUN: 28 mg/dL — ABNORMAL HIGH (ref 8–23)
CO2: 24 mmol/L (ref 22–32)
Calcium: 8.1 mg/dL — ABNORMAL LOW (ref 8.9–10.3)
Chloride: 97 mmol/L — ABNORMAL LOW (ref 98–111)
Creatinine, Ser: 3.27 mg/dL — ABNORMAL HIGH (ref 0.61–1.24)
GFR, Estimated: 19 mL/min — ABNORMAL LOW (ref >60)
Glucose, Bld: 203 mg/dL — ABNORMAL HIGH (ref 70–99)
Potassium: 4.9 mmol/L (ref 3.5–5.1)
Sodium: 128 mmol/L — ABNORMAL LOW (ref 135–145)

## 2023-07-20 LAB — GLUCOSE, CAPILLARY
Glucose-Capillary: 179 mg/dL — ABNORMAL HIGH (ref 70–99)
Glucose-Capillary: 200 mg/dL — ABNORMAL HIGH (ref 70–99)
Glucose-Capillary: 185 mg/dL — ABNORMAL HIGH (ref 70–99)
Glucose-Capillary: 190 mg/dL — ABNORMAL HIGH (ref 70–99)

## 2023-07-20 MED ORDER — INSULIN GLARGINE-YFGN 100 UNIT/ML ~~LOC~~ SOLN
25.0000 [IU] | Freq: Every day | SUBCUTANEOUS | Status: DC
  Administered 2023-07-21: 25 [IU] via SUBCUTANEOUS
  Filled 2023-07-20 (×3): qty 0.25

## 2023-07-20 MED ORDER — ENOXAPARIN SODIUM 30 MG/0.3ML IJ SOSY
30.0000 mg | PREFILLED_SYRINGE | INTRAMUSCULAR | Status: DC
  Administered 2023-07-21: 30 mg via SUBCUTANEOUS
  Filled 2023-07-20: qty 0.3

## 2023-07-20 MED ORDER — SODIUM CHLORIDE 0.9 % IV SOLN: INTRAVENOUS | Status: AC

## 2023-07-20 NOTE — Progress Notes (Signed)
PHARMACIST - PHYSICIAN COMMUNICATION  CONCERNING:  Enoxaparin (Lovenox) for DVT Prophylaxis    RECOMMENDATION: Patient was prescribed enoxaprin 40mg  q24 hours for VTE prophylaxis.   Filed Weights   07/18/23 1341 07/18/23 2028  Weight: 97.1 kg (214 lb 1.1 oz) 97.5 kg (215 lb)    Body mass index is 29.16 kg/m.  Estimated Creatinine Clearance: 24.4 mL/min (A) (by C-G formula based on SCr of 3.27 mg/dL (H)).   Patient is candidate for enoxaparin 30mg  every 24 hours based on CrCl <15ml/min or Weight <45kg  DESCRIPTION: Pharmacy has adjusted enoxaparin dose per [facility] policy.  Patient is now receiving enoxaparin 30 mg every 24 hours   Elliot Gurney, PharmD, BCPS Clinical Pharmacist  07/20/2023 12:23 PM

## 2023-07-20 NOTE — Progress Notes (Signed)
Inpatient Rehab Admissions Coordinator:   Per therapy recommendations,  patient was screened for CIR candidacy by Megan Salon, MS, CCC-SLP. At this time, Pt. Appears to be a a potential candidate for CIR. I will place   order for rehab consult per protocol for full assessment. Please contact me any with questions.  Megan Salon, MS, CCC-SLP Rehab Admissions Coordinator  [phone] (office)   Megan Salon, MS, CCC-SLP Rehab Admissions Coordinator  [phone] (office)

## 2023-07-20 NOTE — Evaluation (Signed)
Physical Therapy Evaluation Patient Details Name: Roberto Ingram MRN: [id number] DOB: 1951-05-05 Today's Date: 07/20/2023  History of Present Illness  Patient is a 72 year old male with generalized weakness, sore throat, dry mouth, diffuse body aches. Sepsis with suspected UTI. History of recent G.I. stromal tumor resection at [facility].   Clinical Impression  Patient is agreeable to PT evaluation with supportive family members at the bedside. The spouse reports a decline in mobility recently after GI procedure. He has been ambulatory recently with a rolling walker and has had 2 recent falls.   The patient required moderate assistance for bed mobility today. Sitting balance is poor initially with posterior lean. 2 standing bouts performed with seated rest break between bouts of standing, Mod A required for transfer. Standing balance is also poor with external support required. Standing tolerance of at least 1 minutes x 2 bouts. Patient is able to stand side steps with Min A with further standing activity limited by fatigue.   The patient is not at  his baseline level of functional independence. Anticipate patient may benefit from rehab >3 hours a day after this hospital stay. PT will continue to follow to maximize independence and facilitate return to prior level of function.       If plan is discharge home, recommend the following: A lot of help with walking and/or transfers;A lot of help with bathing/dressing/bathroom;Assistance with cooking/housework;Help with stairs or ramp for entrance   Can travel by private vehicle        Equipment Recommendations Other (comment) (tub bench)  Recommendations for Other Services  Rehab consult    Functional Status Assessment Patient has had a recent decline in their functional status and demonstrates the ability to make significant improvements in function in a reasonable and predictable amount of time.     Precautions / Restrictions  Precautions Precautions: Fall Restrictions Weight Bearing Restrictions: No      Mobility  Bed Mobility Overal bed mobility: Needs Assistance Bed Mobility: Supine to Sit, Sit to Supine     Supine to sit: Mod assist Sit to supine: Mod assist   General bed mobility comments: verbal cues for sequencing and technique    Transfers Overall transfer level: Needs assistance Equipment used: Rolling walker (2 wheels) Transfers: Sit to/from Stand Sit to Stand: Mod assist           General transfer comment: 2 bouts of standing performed with short rest break between bouts. patient required lifting and lowering assistance with cues for anterior weight shifting and hand placement.    Ambulation/Gait               General Gait Details: patient is able to take 3 small side steps to the right with Min A using rolling walker. occasional posterior lean with wide base of support. further activity limited by fatigue. Sp02 88-91% on room air  Stairs            Wheelchair Mobility     Tilt Bed    Modified Rankin (Stroke Patients Only)       Balance Overall balance assessment: Needs assistance Sitting-balance support: Feet supported Sitting balance-Leahy Scale: Poor Sitting balance - Comments: faciliation required for anterior weight shifting as patient has posterior lean more pronounced initially with sitting upright Postural control: Posterior lean Standing balance support: Bilateral upper extremity supported, Reliant on assistive device for balance Standing balance-Leahy Scale: Poor Standing balance comment: external support required. standing tolerance of at least 1 minute, performed x 2  bouts                             Pertinent Vitals/Pain Pain Assessment Pain Assessment: No/denies pain    Home Living Family/patient expects to be discharged to:: Private residence Living Arrangements: Spouse/significant other Available Help at Discharge:  Family;Available 24 hours/day Type of Home: House Home Access: Stairs to enter;Ramped entrance (steps in front, ramped entrance in the back) Entrance Stairs-Rails: Right;Left (cannpt reach both) Entrance Stairs-Number of Steps: 8   Home Layout: One level Home Equipment: Rolling Walker (2 wheels) (unable to get to the [facility] in the attic)      Prior Function Prior Level of Function : Independent/Modified Independent;History of Falls (last six months)             Mobility Comments: 2 recent falls reported. spouse reports decline in function since recent GI procedure with limited ambulation using rolling walker. was more independent and active prior to recent surgery       Extremity/Trunk Assessment   Upper Extremity Assessment Upper Extremity Assessment: Generalized weakness    Lower Extremity Assessment Lower Extremity Assessment: Generalized weakness (endurance impaired for sustained activity)       Communication   Communication Communication: No apparent difficulties  Cognition Arousal: Alert Behavior During Therapy: WFL for tasks assessed/performed Overall Cognitive Status: Impaired/Different from baseline Area of Impairment: Problem solving, Following commands, Safety/judgement                       Following Commands: Follows one step commands with increased time Safety/Judgement: Decreased awareness of deficits   Problem Solving: Slow processing, Decreased initiation, Difficulty sequencing, Requires verbal cues          General Comments General comments (skin integrity, edema, etc.): family is very supportive and present during session.    Exercises     Assessment/Plan    PT Assessment Patient needs continued PT services  PT Problem List Decreased strength;Decreased range of motion;Decreased activity tolerance;Decreased balance;Decreased mobility;Decreased safety awareness       PT Treatment Interventions DME instruction;Gait training;Stair  training;Functional mobility training;Therapeutic activities;Therapeutic exercise;Balance training;Neuromuscular re-education;Cognitive remediation;Patient/family education;Wheelchair mobility training    PT Goals (Current goals can be found in the Care Plan section)  Acute Rehab PT Goals Patient Stated Goal: spouse would like the patient to be stronger and go home if able PT Goal Formulation: With patient/family Time For Goal Achievement: 08/03/23 Potential to Achieve Goals: Good    Frequency Min 1X/week     Co-evaluation               AM-PAC PT "6 Clicks" Mobility  Outcome Measure Help needed turning from your back to your side while in a flat bed without using bedrails?: A Little Help needed moving from lying on your back to sitting on the side of a flat bed without using bedrails?: A Lot Help needed moving to and from a bed to a chair (including a wheelchair)?: A Lot Help needed standing up from a chair using your arms (e.g., wheelchair or bedside chair)?: A Lot Help needed to walk in hospital room?: A Lot Help needed climbing 3-5 steps with a railing? : A Lot 6 Click Score: 13    End of Session   Activity Tolerance: Patient tolerated treatment well Patient left: in bed;with call bell/phone within reach;with bed alarm set Nurse Communication: Mobility status PT Visit Diagnosis: Unsteadiness on feet (R26.81);Muscle weakness (generalized) (M62.81)  Time: 7846-9629 PT Time Calculation (min) (ACUTE ONLY): 37 min   Charges:   PT Evaluation $PT Eval Moderate Complexity: 1 Mod PT Treatments $Therapeutic Activity: 8-22 mins PT General Charges $$ ACUTE PT VISIT: 1 Visit        Donna Bernard, PT, MPT  [employer] 07/20/2023, 11:57 AM

## 2023-07-20 NOTE — Progress Notes (Signed)
[facility]  - [place] at [facility]   PATIENT NAME: Roberto Ingram    MR#:  [id number]  DATE OF BIRTH:  1951-02-18  SUBJECTIVE:   Wife at bedside. Patient apparently came in with generalized weakness poor PO intake debility since his G.I. stromal resection and liver biopsy from left axillary area and [facility] per wife.  Patient continues to intermittently spike fever 101.2. Did work with physical therapy standing up at the edge of the bed. Quite deconditioned.   VITALS:  Blood pressure (!) 134/56, pulse 79, temperature (!) 101.3 F (38.5 C), temperature source Oral, resp. rate 18, height 6' (1.829 m), weight 97.5 kg, SpO2 94%.  PHYSICAL EXAMINATION:   GENERAL:  72 y.o.-year-old patient with no acute distress. Weak deconditioned LUNGS: Normal breath sounds bilaterally, no wheezing CARDIOVASCULAR: S1, S2 normal. No murmur   ABDOMEN: Soft, nontender, nondistended. Bowel sounds present.  EXTREMITIES: No  edema b/l.   Left axillary skin biopsy area appears normal. NEUROLOGIC: nonfocal  patient is alert and awake  LABORATORY PANEL:  CBC Recent Labs  Lab 07/19/23 1016  WBC 12.9*  HGB 8.9*  HCT 28.4*  PLT 353    Chemistries  Recent Labs  Lab 07/19/23 1016 07/20/23 1051  NA 128* 128*  K 4.7 4.9  CL 97* 97*  CO2 23 24  GLUCOSE 265* 203*  BUN 30* 28*  CREATININE 2.80* 3.27*  CALCIUM 7.8* 8.1*  AST 17  --   ALT 16  --   ALKPHOS 58  --   BILITOT 0.5  --    Cardiac Enzymes No results for input(s): "TROPONINI" in the last 168 hours. RADIOLOGY:  CT CHEST ABDOMEN PELVIS WO CONTRAST  Result Date: 07/18/2023 CLINICAL DATA:  Sepsis flu like symptoms abdominal surgery fever EXAM: CT CHEST, ABDOMEN AND PELVIS WITHOUT CONTRAST TECHNIQUE: Multidetector CT imaging of the chest, abdomen and pelvis was performed following the standard protocol without IV contrast. RADIATION DOSE REDUCTION: This exam was performed according to the departmental dose-optimization program  which includes automated exposure control, adjustment of the mA and/or kV according to patient size and/or use of iterative reconstruction technique. COMPARISON:  CT 06/23/2023 FINDINGS: CT CHEST FINDINGS Cardiovascular: Limited assessment without intravenous contrast. Moderate aortic atherosclerosis. Post CABG changes. Coronary vascular calcifications. Borderline cardiomegaly. No pericardial effusion. Mediastinum/Nodes: Midline trachea. No thyroid mass. Enlarged prevascular lymph node measuring 17 mm series 2, image 16 increased compared to prior. Mild diffuse enlargement of multiple prevascular nodes. Esophagus within normal limits. Multiple enlarged left axillary lymph nodes with dominant left subpectoral node measuring 5.7 x 3.9 cm, previously 4.3 x 3.5 cm. Interval enlargement of multiple additional left axillary nodes with associated soft tissue stranding. Incompletely visualized left supraclavicular mass measuring at least 6.9 by 5.3 cm, series 2, image 1, also increased compared to prior. Lungs/Pleura: Emphysema. No acute airspace disease, pleural effusion, or pneumothorax. No suspicious pulmonary nodules. Musculoskeletal: Sternotomy.  No acute osseous abnormality. CT ABDOMEN PELVIS FINDINGS Hepatobiliary: No focal liver abnormality is seen. No gallstones, gallbladder wall thickening, or biliary dilatation. Pancreas: Unremarkable. No pancreatic ductal dilatation or surrounding inflammatory changes. Spleen: Normal in size without focal abnormality. Adrenals/Urinary Tract: Adrenal glands are within normal limits. Moderate nonspecific perinephric fat stranding. No hydronephrosis. The bladder is unremarkable. Stomach/Bowel: Postsurgical changes of the stomach. No dilated small bowel. No acute bowel wall thickening. Negative appendix. Vascular/Lymphatic: Moderate aortic atherosclerosis. Increased size of right Peri aortic retroperitoneal lymph node on series 2, image 52 measuring 1.6 cm. Multiple subcentimeter  Peri aortic  nodes mildly increased. Slight interval increase in size of multiple subcentimeter iliac nodes. Multiple enlarged right pelvic sidewall and external iliac nodes, for example 2 cm lymph node on series 2, image 105. Right external iliac node measuring 3.1 cm on series 2, image 110, previously 2.3 cm. Ill-defined right groin mass measuring 4.1 cm, previously 4.6 cm. Multiple additional mildly enlarged left groin lymph nodes. Reproductive: Slightly enlarged prostate Other: Negative for pelvic effusion or free air. Skin thickening and subcutaneous infiltration along the anterior abdominal wall possibly due to cutaneous injection. Musculoskeletal: No acute or suspicious osseous abnormality IMPRESSION: 1. Negative for acute airspace disease.  Emphysema. 2. Increased left axillary, mediastinal, and retroperitoneal adenopathy. Increased size of incompletely visualized left supraclavicular mass/suspected adenopathy. Persistent right pelvic, external iliac and right groin adenopathy. Findings could be secondary to metastatic disease or lymphoproliferative disease. Tissue sampling if not already performed is suggested. 3. Nonspecific bilateral perinephric stranding without hydronephrosis. No convincing CT evidence for acute intra-abdominal or pelvic abnormality. Aortic Atherosclerosis (ICD10-I70.0) and Emphysema (ICD10-J43.9). Electronically Signed   By: Jasmine Pang M.D.   On: 07/18/2023 18:07    Assessment and Plan  Roberto Ingram is a 72 y.o. male with medical history significant of coronary artery disease, type 2 diabetes, GERD, BPH, asthmatic bronchitis, history of GI bleed, essential hypertension, hypothyroidism, mixed hyperlipidemia, thyroid disease, recent GI tumor status post removal at [facility] who presents to the ER with generalized weakness, sore throat, dry mouth.  Symptoms have been going on for several days now.  Patient also has diffuse bodyaches.  Has had weakness and decreased appetite.   He has not eaten much.   Urinalysis suggestive of possible UTI but patient has been on Septra.  He appears dehydrated with hyponatremia and AKI.  At this point patient is being admitted to the hospital for management of sepsis due to UTI.  Sepsis present on admission exact etiology unknown, suspected UTI -- patient came in with white count of 13.6, fever of 102, tachycardia -- IV Rocephin -- follow-up blood culture negative  --urine culture pending -- white count trending down -- lactic acid 1.9 -- continue IV fluids -- patient continues to spike fever pure. Will send viral panel. I will ask infectious disease to see patient.  Acute on chronic kidney disease stage IIIB hyponatremia -- baseline creatinine most recent labs from [facility] on July 11, 2023 was 1.8 -- came in with creatinine of 2.75-- 2.8-- continue IV fluids-- 3.27 -- this is suspected from poor PO intake since his recent procedure -- nephrology consultation with Dr. 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 [facility] history of lymphadenopathy status post left axillary lymph node biopsy -- patient follows with oncology at [facility]  Hypertension -- continue home meds  Type II diabetes, hyperglycemia, CKD stage IIIB -Will resume long-acting insulin and sliding scale and adjust insulin dosage according to sugars  Anemia of chronic disease -- hemoglobin stable  Hypothyroidism -- on Synthroid  Obstructive sleep apnea -- continue CPAP  Generalized weakness with debility -- PT OT to see patient      Procedures: Family communication : wife at bedside Consults : none CODE STATUS: full DVT Prophylaxis : enoxaparin Level of care: Telemetry Medical Status is: Inpatient Remains inpatient appropriate because: treatment for sepsis    TOTAL TIME TAKING CARE OF THIS PATIENT: 35 minutes.  >50% time spent on counselling and coordination of care  Note: This dictation was prepared with Dragon  dictation along with  smaller phrase technology. Any transcriptional errors that result from this process are unintentional.  Enedina Finner M.D    [facility]   CC: Primary care physician; Erasmo Downer, NP

## 2023-07-20 NOTE — Evaluation (Signed)
Occupational Therapy Evaluation Patient Details Name: Roberto Ingram MRN: [id number] DOB: Dec 04, 1950 Today's Date: 07/20/2023   History of Present Illness Patient is a 72 year old male with generalized weakness, sore throat, dry mouth, diffuse body aches. Sepsis with suspected UTI. History of recent G.I. stromal tumor resection at [facility].   Clinical Impression   Pt was seen for OT evaluation this date. Prior to hospital admission, pt was requiring increased assist from spouse following recent surgery. Spouse notes pt was more independent prior to that. Spouse notes that just prior to OT's arrival, pt had been to the bathroom and back with 2 staff for toileting needs, and spouse had assisted with a sponge bath. Pt just returned to bed, fatigued from the activity. Pt presents to acute OT demonstrating impaired ADL performance and functional mobility 2/2 impaired strength, balance, and activity tolerance (See OT problem list for additional functional deficits). Pt/spouse educated in ECS and AD for showering to improve safety/indep. Handout provided to support recall and carryover. Pt would benefit from high intensity (>3hrs/day) skilled OT services to address noted impairments and functional limitations (see below for any additional details) in order to maximize safety and independence while minimizing falls risk and caregiver burden.     If plan is discharge home, recommend the following: A lot of help with walking and/or transfers;A lot of help with bathing/dressing/bathroom;Assistance with cooking/housework;Assist for transportation;Help with stairs or ramp for entrance;Direct supervision/assist for medications management    Functional Status Assessment  Patient has had a recent decline in their functional status and demonstrates the ability to make significant improvements in function in a reasonable and predictable amount of time.  Equipment Recommendations  BSC/3in1;Tub/shower bench;Other (comment)  Lexicographer)    Recommendations for Other Services       Precautions / Restrictions Precautions Precautions: Fall Restrictions Weight Bearing Restrictions: No      Mobility Bed Mobility               General bed mobility comments: deferred, pt just returned to bed per wife after toileting in bathroom and receiving bath from wife    Transfers                   General transfer comment: wife reports pt was able to walk to/from bathroom wiht RW and 2 staff for toileting needs just prior to OT assessment; will further assess      Balance                                           ADL either performed or assessed with clinical judgement   ADL                                         General ADL Comments: Pt currently requiring MAX A for seated bath per wife, assist for ADL transfers and mobility wiht RW, and MIN A For seated UB ADL.     Vision         Perception         Praxis         Pertinent Vitals/Pain Pain Assessment Pain Assessment: No/denies pain     Extremity/Trunk Assessment Upper Extremity Assessment Upper Extremity Assessment: Generalized weakness   Lower Extremity Assessment Lower Extremity Assessment: Generalized weakness  Communication Communication Communication: No apparent difficulties   Cognition Arousal: Lethargic Behavior During Therapy: WFL for tasks assessed/performed Overall Cognitive Status: Impaired/Different from baseline Area of Impairment: Problem solving                             Problem Solving: Slow processing, Decreased initiation, Requires verbal cues       General Comments       Exercises Other Exercises Other Exercises: Pt/spouse educated in energy conservation strategies including activity pacing, AE/DME, falls prevention, work simplification, benefits of a tub transfer bench to improve safety/access to the shower, and prioritizing meaningful  occupational engagement when able   Shoulder Instructions      Home Living Family/patient expects to be discharged to:: Private residence Living Arrangements: Spouse/significant other Available Help at Discharge: Family;Available 24 hours/day Type of Home: House Home Access: Stairs to enter;Ramped entrance (steps in front, ramped entrance in the back) Entrance Stairs-Number of Steps: 8 Entrance Stairs-Rails: Right;Left (cannpt reach both) Home Layout: One level     Bathroom Shower/Tub: Tub/shower unit   Bathroom Toilet: Handicapped height     Home Equipment: Agricultural consultant (2 wheels) (unable to get to the [facility] in the attic)          Prior Functioning/Environment Prior Level of Function : Independent/Modified Independent;History of Falls (last six months)             Mobility Comments: 2 recent falls reported. spouse reports decline in function since recent GI procedure with limited ambulation using rolling walker. was more independent and active prior to recent surgery ADLs Comments: spouse has been providing some assist for ADL since recent surgery        OT Problem List: Decreased strength;Decreased activity tolerance;Decreased safety awareness;Impaired balance (sitting and/or standing);Decreased knowledge of use of DME or AE      OT Treatment/Interventions: Self-care/ADL training;Therapeutic exercise;Therapeutic activities;Energy conservation;DME and/or AE instruction;Patient/family education;Balance training    OT Goals(Current goals can be found in the care plan section) Acute Rehab OT Goals Patient Stated Goal: get better OT Goal Formulation: With patient/family Time For Goal Achievement: 08/03/23 Potential to Achieve Goals: Good ADL Goals Pt Will Perform Lower Body Dressing: with caregiver independent in assisting;sit to/from stand Pt Will Transfer to Toilet: with supervision;ambulating (LRAD, BSC over toilet) Pt Will Perform Toileting - Clothing Manipulation  and hygiene: with modified independence Additional ADL Goal #1: Pt/family will verbalize plan to implement at least 2 learned energy conservation strategies to maximize safety/indep with ADL/mobility.  OT Frequency: Min 1X/week    Co-evaluation              AM-PAC OT "6 Clicks" Daily Activity     Outcome Measure Help from another person eating meals?: None Help from another person taking care of personal grooming?: A Little Help from another person toileting, which includes using toliet, bedpan, or urinal?: A Lot Help from another person bathing (including washing, rinsing, drying)?: A Lot Help from another person to put on and taking off regular upper body clothing?: A Little Help from another person to put on and taking off regular lower body clothing?: A Lot 6 Click Score: 16   End of Session    Activity Tolerance: Patient limited by fatigue Patient left: in bed;with call bell/phone within reach;with bed alarm set;with family/visitor present  OT Visit Diagnosis: Other abnormalities of gait and mobility (R26.89);Muscle weakness (generalized) (M62.81)  Time: 6962-9528 OT Time Calculation (min): 27 min Charges:  OT General Charges $OT Visit: 1 Visit OT Evaluation $OT Eval Moderate Complexity: 1 Mod OT Treatments $Self Care/Home Management : 8-22 mins  Arman Filter., MPH, MS, OTR/L ascom [phone] 07/20/23, 3:20 PM

## 2023-07-21 DIAGNOSIS — N179 Acute kidney failure, unspecified: Secondary | ICD-10-CM | POA: Diagnosis not present

## 2023-07-21 DIAGNOSIS — G4733 Obstructive sleep apnea (adult) (pediatric): Secondary | ICD-10-CM | POA: Diagnosis not present

## 2023-07-21 DIAGNOSIS — N390 Urinary tract infection, site not specified: Secondary | ICD-10-CM | POA: Diagnosis not present

## 2023-07-21 DIAGNOSIS — I10 Essential (primary) hypertension: Secondary | ICD-10-CM | POA: Diagnosis not present

## 2023-07-21 DIAGNOSIS — A419 Sepsis, unspecified organism: Secondary | ICD-10-CM | POA: Diagnosis not present

## 2023-07-21 DIAGNOSIS — E43 Unspecified severe protein-calorie malnutrition: Secondary | ICD-10-CM | POA: Insufficient documentation

## 2023-07-21 LAB — GLUCOSE, CAPILLARY
Glucose-Capillary: 147 mg/dL — ABNORMAL HIGH (ref 70–99)
Glucose-Capillary: 144 mg/dL — ABNORMAL HIGH (ref 70–99)
Glucose-Capillary: 84 mg/dL (ref 70–99)
Glucose-Capillary: 97 mg/dL (ref 70–99)

## 2023-07-21 LAB — URINE CULTURE: Culture: NO GROWTH

## 2023-07-21 LAB — PHOSPHORUS: Phosphorus: 2.1 mg/dL — ABNORMAL LOW (ref 2.5–4.6)

## 2023-07-21 LAB — HEPATITIS B CORE ANTIBODY, TOTAL: Hep B Core Total Ab: NONREACTIVE

## 2023-07-21 LAB — HEPATITIS B SURFACE ANTIGEN: Hepatitis B Surface Ag: NONREACTIVE

## 2023-07-21 LAB — LACTATE DEHYDROGENASE: LDH: 179 U/L (ref 98–192)

## 2023-07-21 LAB — URIC ACID: Uric Acid, Serum: 8.3 mg/dL (ref 3.7–8.6)

## 2023-07-21 MED ORDER — MELATONIN 5 MG PO TABS: 5.0000 mg | ORAL_TABLET | Freq: Every evening | ORAL | Status: DC | PRN

## 2023-07-21 MED ORDER — TRAZODONE HCL 50 MG PO TABS
25.0000 mg | ORAL_TABLET | Freq: Every evening | ORAL | Status: DC | PRN
  Administered 2023-07-22 – 2023-07-23 (×3): 25 mg via ORAL
  Filled 2023-07-21 (×3): qty 1

## 2023-07-21 MED ORDER — GLUCERNA SHAKE PO LIQD
237.0000 mL | Freq: Three times a day (TID) | ORAL | Status: DC
  Administered 2023-07-21 – 2023-07-24 (×6): 237 mL via ORAL

## 2023-07-21 MED ORDER — ADULT MULTIVITAMIN W/MINERALS CH
1.0000 | ORAL_TABLET | Freq: Every day | ORAL | Status: DC
  Administered 2023-07-23 – 2023-07-24 (×2): 1 via ORAL
  Filled 2023-07-21 (×2): qty 1

## 2023-07-21 NOTE — Consult Note (Signed)
SURGICAL CONSULTATION NOTE   HISTORY OF PRESENT ILLNESS (HPI):  72 y.o. male presented to [facility] ED for evaluation of weakness. 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  He endorses that during the last week he has been feeling weak.  He he is also complaining of bodyaches.  Also associated fever.  Cannot identify any specific pain.  No pain radiation.  No alleviating or aggravating factors.  He was evaluated by [facility] oncology on 07/11/2023 and he was found with newly diagnosed lymphadenopathy on the left axillary area mainly.  Workup was initiated but patient presented to [facility] with worsening body aches and weakness.  He was admitted here for management of suspected sepsis due to UTI.  Patient has a CT of the chest abdomen and pelvis that shows significant adenopathy mostly on the left axillary area.  There is also nodularities in the supraclavicular area and retroperitoneally.  I personally evaluated the images.  I was consulted by Dr. Allena Katz, hospitalist recommending excisional biopsy due to insufficient tissue from previous needle biopsy done at [facility].   PAST MEDICAL HISTORY (PMH):  Past Medical History:  Diagnosis Date   Anemia    Anxiety    Generalized anxiety disorder   Asthmatic bronchitis    Atopic dermatitis    BPH (benign prostatic hyperplasia)    Cardiovascular disease    Carotid disease, bilateral (HCC)    Colon polyp    Constipation    Coronary artery disease    Diabetes mellitus without complication (HCC)    Diabetic retinopathy (HCC)    GERD (gastroesophageal reflux disease)    Glaucoma of left eye    Gout    History of GI bleed    Hypercholesterolemia    Hypertension    Hypomagnesemia    Hypothyroidism    Insomnia    Mixed hyperlipidemia    Myocardial infarction (HCC)    Pneumonia    Renal disorder    Renal insufficiency    Sleep apnea    wears oxygen at night (unable to tolerate CPAP)   Stroke (HCC)    Stroke [facility])     Thyroid disease    Vitamin D deficiency      PAST SURGICAL HISTORY (PSH):  Past Surgical History:  Procedure Laterality Date   COLONOSCOPY     COLONOSCOPY WITH PROPOFOL N/A 02/24/2018   Procedure: COLONOSCOPY WITH PROPOFOL;  Surgeon: Toledo, Boykin Nearing, MD;  Location: [facility] ENDOSCOPY;  Service: Gastroenterology;  Laterality: N/A;   COLONOSCOPY WITH PROPOFOL N/A 08/06/2022   Procedure: COLONOSCOPY WITH PROPOFOL;  Surgeon: Regis Bill, MD;  Location: [facility] ENDOSCOPY;  Service: Gastroenterology;  Laterality: N/A;   CORONARY ANGIOPLASTY     CORONARY ARTERY BYPASS GRAFT     ESOPHAGOGASTRODUODENOSCOPY     EXPLORATION POST OPERATIVE OPEN HEART     EYE SURGERY     Lens eye surgery   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 Authorizing Provider  acetaminophen (TYLENOL) 325 MG tablet Take 650 mg by mouth every 6 (six) hours as needed. 06/09/23  Yes [provider]  allopurinol (ZYLOPRIM) 100 MG tablet Take 100 mg by mouth daily as needed (gout).   Yes [provider]  ALPRAZolam Prudy Feeler) 0.5 MG tablet Take 0.5  mg by mouth at bedtime as needed for sleep.    Yes [provider]  amLODipine (NORVASC) 10 MG tablet Take 10 mg by mouth daily.   Yes [provider]  ammonium lactate (AMLACTIN) 12 % cream Apply 1 Application topically 2 (two) times daily. Apply to arms, legs, and body one to two times daily 06/12/23  Yes [provider]  ascorbic acid (VITAMIN C) 500 MG tablet Take 500 mg by mouth daily.   Yes [provider]  aspirin 81 MG chewable tablet Chew 81 mg by mouth daily.    Yes [provider]  cloNIDine (CATAPRES) 0.1 MG tablet Take 0.1 mg by mouth 2 (two) times daily. 07/09/23  Yes [provider]  clopidogrel  (PLAVIX) 75 MG tablet Take 75 mg by mouth daily.   Yes [provider]  colchicine 0.6 MG tablet Take 0.6 mg by mouth as needed (gout flares). 05/13/23  Yes [provider]  diphenhydrAMINE (BENADRYL ALLERGY) 25 mg capsule Take 1 capsule (25 mg total) by mouth every 6 (six) hours as needed. 05/23/23  Yes Dionne Bucy, MD  fluticasone (FLONASE) 50 MCG/ACT nasal spray Place 2 sprays into both nostrils daily as needed for allergies or rhinitis.   Yes [provider]  fluticasone-salmeterol (ADVAIR) 250-50 MCG/ACT AEPB Inhale 1 puff into the lungs 2 (two) times daily.   Yes [provider]  insulin aspart (NOVOLOG) 100 UNIT/ML injection Inject 16-18 Units into the skin 3 (three) times daily before meals. 16 units with breakfast and lunch 18 units with dinner   Yes [provider]  LANTUS SOLOSTAR 100 UNIT/ML Solostar Pen Inject 68 Units into the skin daily. 07/03/23  Yes [provider]  levothyroxine (SYNTHROID) 50 MCG tablet Take 50 mcg by mouth daily before breakfast.  01/12/14  Yes [provider]  metoprolol tartrate (LOPRESSOR) 25 MG tablet Take 50 mg by mouth 2 (two) times daily. 03/28/14  Yes [provider]  nitroGLYCERIN (NITROSTAT) 0.4 MG SL tablet Place 0.4 mg under the tongue every 5 (five) minutes as needed for chest pain.    Yes [provider]  Omega-3 Fatty Acids (FISH OIL) 1000 MG CAPS Take 1,000 mg by mouth daily.    Yes [provider]  pantoprazole (PROTONIX) 40 MG tablet Take 40 mg by mouth 2 (two) times daily. 06/21/23  Yes [provider]  rosuvastatin (CRESTOR) 40 MG tablet Take 40 mg by mouth daily.  03/29/14  Yes [provider]  Semaglutide, 2 MG/DOSE, (OZEMPIC, 2 MG/DOSE,) 8 MG/3ML SOPN Inject 2 mg into the skin every Thursday.   Yes [provider]  sulfamethoxazole-trimethoprim (BACTRIM DS) 800-160 MG tablet Take 1 tablet by mouth 2 (two) times daily. 07/17/23   Yes [provider]  tamsulosin (FLOMAX) 0.4 MG CAPS capsule Take 0.4 mg by mouth daily after supper.    Yes [provider]  Vitamin D, Ergocalciferol, (DRISDOL) 50000 units CAPS capsule Take 50,000 Units by mouth every 30 (thirty) days. Takes on 1st of each month   Yes [provider]  mometasone (NASONEX) 50 MCG/ACT nasal spray Place into the nose.  09/11/16  [provider]     ALLERGIES:  Allergies  Allergen Reactions   Antihistamines, Chlorpheniramine-Type Swelling    Prostat   Diphenhydramine Hcl Swelling   Fluorescein Dermatitis, Hives, Itching and Other (See Comments)   Iodinated Contrast Media Other (See Comments)   Lisinopril Swelling   Ezetimibe Hives and Other (See Comments)  Furosemide Swelling   Metformin Hives and Other (See Comments)   Metoprolol Itching   Metoprolol Tartrate Itching   Nsaids Other (See Comments)    Does take aspirin   Sulfa Antibiotics Hives and Other (See Comments)   Brilinta [Ticagrelor] Other (See Comments)    Headaches, off balance, no appetite   Diphenhydramine Hcl Other (See Comments)    "The more I take, it makes my prostate swell..."   Other Itching and Other (See Comments)    FA Dye Prostate swelling FA Dye  Prostate swelling     SOCIAL HISTORY:  Social History   Socioeconomic History   Marital status: Married    Spouse name: Sheralyn Boatman   Number of children: 3   Years of education: 12   Highest education level: High school graduate  Occupational History   Occupation: retired  Tobacco Use   Smoking status: Former    Current packs/day: 0.00    Types: Cigarettes    Quit date: 06/11/1993    Years since quitting: 30.1   Smokeless tobacco: Never  Vaping Use   Vaping status: Never Used  Substance and Sexual Activity   Alcohol use: No   Drug use: No   Sexual activity: Not on file    Comment: Married   Other Topics Concern   Not on file  Social History Narrative   Not on file   Social  Determinants of Health   Financial Resource Strain: Medium Risk (06/10/2023)   [facility]   Overall Financial Resource Strain (CARDIA)    Difficulty of Paying Living Expenses: Somewhat hard  Food Insecurity: No Food Insecurity (07/18/2023)   Hunger Vital Sign    Worried About Running Out of Food in the Last Year: Never true    Ran Out of Food in the Last Year: Never true  Transportation Needs: No Transportation Needs (07/18/2023)   PRAPARE - Administrator, Civil Service (Medical): No    Lack of Transportation (Non-Medical): No  Physical Activity: Insufficiently Active (03/04/2018)   Exercise Vital Sign    Days of Exercise per Week: 2 days    Minutes of Exercise per Session: 10 min  Stress: No Stress Concern Present (03/04/2018)   [employer] of Occupational Health - Occupational Stress Questionnaire    Feeling of Stress : Only a little  Social Connections: Socially Integrated (03/04/2018)   Social Connection and Isolation Panel [NHANES]    Frequency of Communication with Friends and Family: More than three times a week    Frequency of Social Gatherings with Friends and Family: Once a week    Attends Religious Services: More than 4 times per year    Active Member of [employer] or Organizations: Yes    Attends Engineer, structural: More than 4 times per year    Marital Status: Married  Catering manager Violence: Not At Risk (03/04/2018)   Humiliation, Afraid, Rape, and Kick questionnaire    Fear of Current or Ex-Partner: No    Emotionally Abused: No    Physically Abused: No    Sexually Abused: No      FAMILY HISTORY:  Family History  Problem Relation Age of Onset   Atrial fibrillation Mother    Heart disease Mother    Stroke Mother    Bell's palsy Mother    Kidney disease Mother    Cancer Mother    Diabetes Father    Arthritis Father    Stroke Brother  Diabetes Brother    Lung cancer Brother    Heart attack Brother     Cancer Brother    Heart disease Sister    COPD Sister    Throat cancer Sister      REVIEW OF SYSTEMS:  Constitutional: Positive  fever, chills, or sweats  Eyes: denies any other vision changes, history of eye injury  ENT: denies sore throat, hearing problems  Respiratory: denies shortness of breath, wheezing  Cardiovascular: denies chest pain, palpitations  Gastrointestinal: Denies abdominal pain, nausea and vomiting Genitourinary: denies burning with urination or urinary frequency Musculoskeletal: Positive joint pains or cramps  Skin: denies any other rashes or skin discolorations  Neurological: Positive dizziness, weakness  Psychiatric: denies any other depression, anxiety   All other review of systems were negative   VITAL SIGNS:  Temp:  [98.4 F (36.9 C)-102.7 F (39.3 C)] 98.9 F (37.2 C) (09/09 1228) Pulse Rate:  [70-91] 89 (09/09 0835) Resp:  [18-20] 18 (09/09 0835) BP: (106-148)/(45-69) 148/57 (09/09 0835) SpO2:  [92 %-95 %] 92 % (09/09 0835)     Height: 6' (182.9 cm) Weight: 97.5 kg BMI (Calculated): 29.15   INTAKE/OUTPUT:  This shift: No intake/output data recorded.  Last 2 shifts: @IOLAST2SHIFTS @   PHYSICAL EXAM:  Constitutional:  -- Normal body habitus  -- Awake, alert, and oriented x3  Eyes:  -- Pupils equally round and reactive to light  -- No scleral icterus  Ear, nose, and throat:  -- No jugular venous distension  Pulmonary:  -- No crackles  -- Equal breath sounds bilaterally -- Breathing non-labored at rest Cardiovascular:  -- S1, S2 present  -- No pericardial rubs Gastrointestinal:  -- Abdomen soft, nontender, non-distended, no guarding or rebound tenderness -- No abdominal masses appreciated, pulsatile or otherwise  Musculoskeletal and Integumentary:  -- Wounds: None appreciated -- Extremities: B/L UE and LE FROM, hands and feet warm, no edema  --Palpable axillary apathy mainly on the left axillary area. Neurologic:  -- Motor function:  intact and symmetric -- Sensation: intact and symmetric   Labs:     Latest Ref Rng & Units 07/19/2023   10:16 AM 07/18/2023    1:43 PM 06/22/2023    8:41 PM  CBC  WBC 4.0 - 10.5 K/uL 12.9  13.6  16.8   Hemoglobin 13.0 - 17.0 g/dL 8.9  9.7  9.6   Hematocrit 39.0 - 52.0 % 28.4  31.2  30.6   Platelets 150 - 400 K/uL 353  345  521       Latest Ref Rng & Units 07/20/2023   10:51 AM 07/19/2023   10:16 AM 07/18/2023    4:16 PM  CMP  Glucose 70 - 99 mg/dL 528  413    BUN 8 - 23 mg/dL 28  30    Creatinine 2.44 - 1.24 mg/dL 0.10  2.72    Sodium 536 - 145 mmol/L 128  128    Potassium 3.5 - 5.1 mmol/L 4.9  4.7    Chloride 98 - 111 mmol/L 97  97    CO2 22 - 32 mmol/L 24  23    Calcium 8.9 - 10.3 mg/dL 8.1  7.8    Total Protein 6.5 - 8.1 g/dL  6.8  7.3   Total Bilirubin 0.3 - 1.2 mg/dL  0.5  0.6   Alkaline Phos 38 - 126 U/L  58  60   AST 15 - 41 U/L  17  18   ALT 0 - 44 U/L  16  16     Imaging studies:  EXAM: CT CHEST, ABDOMEN AND PELVIS WITHOUT CONTRAST   TECHNIQUE: Multidetector CT imaging of the chest, abdomen and pelvis was performed following the standard protocol without IV contrast.   RADIATION DOSE REDUCTION: This exam was performed according to the departmental dose-optimization program which includes automated exposure control, adjustment of the mA and/or kV according to patient size and/or use of iterative reconstruction technique.   COMPARISON:  CT 06/23/2023   FINDINGS: CT CHEST FINDINGS   Cardiovascular: Limited assessment without intravenous contrast. Moderate aortic atherosclerosis. Post CABG changes. Coronary vascular calcifications. Borderline cardiomegaly. No pericardial effusion.   Mediastinum/Nodes: Midline trachea. No thyroid mass. Enlarged prevascular lymph node measuring 17 mm series 2, image 16 increased compared to prior. Mild diffuse enlargement of multiple prevascular nodes. Esophagus within normal limits. Multiple enlarged left axillary lymph nodes  with dominant left subpectoral node measuring 5.7 x 3.9 cm, previously 4.3 x 3.5 cm. Interval enlargement of multiple additional left axillary nodes with associated soft tissue stranding. Incompletely visualized left supraclavicular mass measuring at least 6.9 by 5.3 cm, series 2, image 1, also increased compared to prior.   Lungs/Pleura: Emphysema. No acute airspace disease, pleural effusion, or pneumothorax. No suspicious pulmonary nodules.   Musculoskeletal: Sternotomy.  No acute osseous abnormality.   CT ABDOMEN PELVIS FINDINGS   Hepatobiliary: No focal liver abnormality is seen. No gallstones, gallbladder wall thickening, or biliary dilatation.   Pancreas: Unremarkable. No pancreatic ductal dilatation or surrounding inflammatory changes.   Spleen: Normal in size without focal abnormality.   Adrenals/Urinary Tract: Adrenal glands are within normal limits. Moderate nonspecific perinephric fat stranding. No hydronephrosis. The bladder is unremarkable.   Stomach/Bowel: Postsurgical changes of the stomach. No dilated small bowel. No acute bowel wall thickening. Negative appendix.   Vascular/Lymphatic: Moderate aortic atherosclerosis. Increased size of right Peri aortic retroperitoneal lymph node on series 2, image 52 measuring 1.6 cm. Multiple subcentimeter Peri aortic nodes mildly increased. Slight interval increase in size of multiple subcentimeter iliac nodes. Multiple enlarged right pelvic sidewall and external iliac nodes, for example 2 cm lymph node on series 2, image 105. Right external iliac node measuring 3.1 cm on series 2, image 110, previously 2.3 cm. Ill-defined right groin mass measuring 4.1 cm, previously 4.6 cm. Multiple additional mildly enlarged left groin lymph nodes.   Reproductive: Slightly enlarged prostate   Other: Negative for pelvic effusion or free air. Skin thickening and subcutaneous infiltration along the anterior abdominal wall possibly due to  cutaneous injection.   Musculoskeletal: No acute or suspicious osseous abnormality   IMPRESSION: 1. Negative for acute airspace disease.  Emphysema. 2. Increased left axillary, mediastinal, and retroperitoneal adenopathy. Increased size of incompletely visualized left supraclavicular mass/suspected adenopathy. Persistent right pelvic, external iliac and right groin adenopathy. Findings could be secondary to metastatic disease or lymphoproliferative disease. Tissue sampling if not already performed is suggested. 3. Nonspecific bilateral perinephric stranding without hydronephrosis. No convincing CT evidence for acute intra-abdominal or pelvic abnormality.   Aortic Atherosclerosis (ICD10-I70.0) and Emphysema (ICD10-J43.9).     Electronically Signed   By: Jasmine Pang M.D.   On: 07/18/2023 18:07  Assessment/Plan:  73 y.o. male with lymphadenopathy, complicated by pertinent comorbidities including history of GIST, coronary artery disease, type 2 diabetes mellitus.  Patient with lymphadenopathy of unknown origin.  He has been doing intermittent fever, weakness, body aches.  Initially treated for sepsis but has not improved significantly clinically.  Hospitalist recommending excisional biopsy of the lymph nodes  to rule out malignancy such as metastatic disease versus lymphoma.  I discussed with patient surgery for excisional biopsy.  Discussed with patient the risk including bleeding, infection, hematoma, pain, injury to nerves, scarring, among others.  The patient reported he understood and agreed to proceed with excisional biopsy of the left axillary lymphadenopathy.  Gae Gallop, MD

## 2023-07-21 NOTE — Consult Note (Signed)
Hematology/Oncology Consult note [facility] [phone] [phone]  Patient Care Team: Erasmo Downer, NP as PCP - General (Nurse Practitioner) Dagoberto Ligas, MD as PCP - Endocrinology (Nephrology) Stanton Kidney, MD as Consulting Physician (Gastroenterology) Mickle Mallory (Gastroenterology)   Name of the patient: Roberto Ingram  [id number]  [birth year]    Reason for referral-supraclavicular and axillary adenopathy   Requesting physician: Dr. Enedina Finner  Date of visit: 07/21/2023    History of presenting illness- Patient is a 72 year old male with a past medical history significant for hypertension, CAD status post CABG in 2007, prior stroke diabetes and CKD.  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  He presented to the ER in August 2024 with symptoms of chest pain and underwent a CTA chest abdomen and pelvis with contrast.  This showed diffuse adenopathy and patient underwent FNA of axillary lymph node which was inconclusive and negative for malignancy.  Patient presented to [facility] ER with symptoms of generalized weakness and intermittent fevers.  He is undergoing workup by ID for fever.  Urine cultures negative so far.  Repeat CT chest abdomen and pelvis with out contrast showed left axillary mediastinal and retroperitoneal adenopathy as well as left supraclavicular adenopathy which was bulky.  Right groin and right pelvic external iliac adenopathy.  Findings concerning for metastatic disease versus lymphoproliferative disease.  Oncology consulted for further management.  ECOG PS- 3  Pain scale- 0   Review of systems- Review of Systems  Constitutional:  Positive for fever and malaise/fatigue. Negative for chills and weight loss.  HENT:  Negative for congestion, ear discharge and nosebleeds.   Eyes:  Negative for blurred vision.  Respiratory:  Negative for cough, hemoptysis, sputum  production, shortness of breath and wheezing.   Cardiovascular:  Negative for chest pain, palpitations, orthopnea and claudication.  Gastrointestinal:  Negative for abdominal pain, blood in stool, constipation, diarrhea, heartburn, melena, nausea and vomiting.  Genitourinary:  Negative for dysuria, flank pain, frequency, hematuria and urgency.  Musculoskeletal:  Negative for back pain, joint pain and myalgias.  Skin:  Negative for rash.  Neurological:  Negative for dizziness, tingling, focal weakness, seizures, weakness and headaches.  Endo/Heme/Allergies:  Does not bruise/bleed easily.  Psychiatric/Behavioral:  Negative for depression and suicidal ideas. The patient does not have insomnia.     Allergies  Allergen Reactions   Antihistamines, Chlorpheniramine-Type Swelling    Prostat   Diphenhydramine Hcl Swelling   Fluorescein Dermatitis, Hives, Itching and Other (See Comments)   Iodinated Contrast Media Other (See Comments)   Lisinopril Swelling   Ezetimibe Hives and Other (See Comments)   Furosemide Swelling   Metformin Hives and Other (See Comments)   Metoprolol Itching   Metoprolol Tartrate Itching   Nsaids Other (See Comments)    Does take aspirin   Sulfa Antibiotics Hives and Other (See Comments)   Brilinta [Ticagrelor] Other (See Comments)    Headaches, off balance, no appetite   Diphenhydramine Hcl Other (See Comments)    "The more I take, it makes my prostate swell..."   Other Itching and Other (See Comments)    FA Dye Prostate swelling FA Dye  Prostate swelling    Patient Active Problem List   Diagnosis Date Noted   Protein-calorie malnutrition, severe 07/21/2023   Sepsis secondary to UTI (HCC) 07/18/2023   AKI (acute kidney injury) (HCC) 07/18/2023   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   History of stroke  02/11/2018   Obstructive sleep apnea on CPAP 02/11/2018   Hyperlipemia 03/10/2017   OSA (obstructive sleep apnea) 03/10/2017    Cerebrovascular accident (CVA) due to thrombosis of basilar artery (HCC)    Acute ischemic stroke (HCC) 01/24/2017   Essential hypertension 01/24/2017   Hypothyroidism (acquired) 01/24/2017   Type 2 diabetes mellitus with hyperlipidemia (HCC) 04/03/2015   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) 12/07/2012     Past Medical History:  Diagnosis Date   Anemia    Anxiety    Generalized anxiety disorder   Asthmatic bronchitis    Atopic dermatitis    BPH (benign prostatic hyperplasia)    Cardiovascular disease    Carotid disease, bilateral (HCC)    Colon polyp    Constipation    Coronary artery disease    Diabetes mellitus without complication (HCC)    Diabetic retinopathy (HCC)    GERD (gastroesophageal reflux disease)    Glaucoma of left eye    Gout    History of GI bleed    Hypercholesterolemia    Hypertension    Hypomagnesemia    Hypothyroidism    Insomnia    Mixed hyperlipidemia    Myocardial infarction (HCC)    Pneumonia    Renal disorder    Renal insufficiency    Sleep apnea    wears oxygen at night (unable to tolerate CPAP)   Stroke (HCC)    Stroke [facility])    Thyroid disease    Vitamin D deficiency      Past Surgical History:  Procedure Laterality Date   COLONOSCOPY     COLONOSCOPY WITH PROPOFOL N/A 02/24/2018   Procedure: COLONOSCOPY WITH PROPOFOL;  Surgeon: Toledo, Boykin Nearing, MD;  Location: [facility] ENDOSCOPY;  Service: Gastroenterology;  Laterality: N/A;   COLONOSCOPY WITH PROPOFOL N/A 08/06/2022   Procedure: COLONOSCOPY WITH PROPOFOL;  Surgeon: Regis Bill, MD;  Location: [facility] ENDOSCOPY;  Service: Gastroenterology;  Laterality: N/A;   CORONARY ANGIOPLASTY     CORONARY ARTERY BYPASS GRAFT     ESOPHAGOGASTRODUODENOSCOPY     EXPLORATION POST OPERATIVE OPEN HEART     EYE SURGERY     Lens eye surgery   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)   07/2023   NASAL POLYP EXCISION  1994   POLYPECTOMY     Quadruple bypass      Social  History   Socioeconomic History   Marital status: Married    Spouse name: Sheralyn Boatman   Number of children: 3   Years of education: 12   Highest education level: High school graduate  Occupational History   Occupation: retired  Tobacco Use   Smoking status: Former    Current packs/day: 0.00    Types: Cigarettes    Quit date: 06/11/1993    Years since quitting: 30.1   Smokeless tobacco: Never  Vaping Use   Vaping status: Never Used  Substance and Sexual Activity   Alcohol use: No   Drug use: No   Sexual activity: Not on file    Comment: Married   Other Topics Concern   Not on file  Social History Narrative   Not on file   Social Determinants of Health   Financial Resource Strain: Medium Risk (06/10/2023)   Received from [facility]   Overall Financial Resource Strain (CARDIA)    Difficulty of Paying Living Expenses: Somewhat hard  Food Insecurity: No Food Insecurity (07/18/2023)   Hunger Vital Sign    Worried About Running Out of Food in the  Last Year: Never true    Ran Out of Food in the Last Year: Never true  Transportation Needs: No Transportation Needs (07/18/2023)   PRAPARE - Administrator, Civil Service (Medical): No    Lack of Transportation (Non-Medical): No  Physical Activity: Insufficiently Active (03/04/2018)   Exercise Vital Sign    Days of Exercise per Week: 2 days    Minutes of Exercise per Session: 10 min  Stress: No Stress Concern Present (03/04/2018)   [employer] of Occupational Health - Occupational Stress Questionnaire    Feeling of Stress : Only a little  Social Connections: Socially Integrated (03/04/2018)   Social Connection and Isolation Panel [NHANES]    Frequency of Communication with Friends and Family: More than three times a week    Frequency of Social Gatherings with Friends and Family: Once a week    Attends Religious Services: More than 4 times per year    Active Member of [employer] or Organizations: Yes    Attends  Engineer, structural: More than 4 times per year    Marital Status: Married  Catering manager Violence: Not At Risk (03/04/2018)   Humiliation, Afraid, Rape, and Kick questionnaire    Fear of Current or Ex-Partner: No    Emotionally Abused: No    Physically Abused: No    Sexually Abused: No     Family History  Problem Relation Age of Onset   Atrial fibrillation Mother    Heart disease Mother    Stroke Mother    Bell's palsy Mother    Kidney disease Mother    Cancer Mother    Diabetes Father    Arthritis Father    Stroke Brother    Diabetes Brother    Lung cancer Brother    Heart attack Brother    Cancer Brother    Heart disease Sister    COPD Sister    Throat cancer Sister      Current Facility-Administered Medications:    acetaminophen (TYLENOL) tablet 650 mg, 650 mg, Oral, Q6H PRN, Enedina Finner, MD, 650 mg at 07/21/23 0844   albuterol (PROVENTIL) (2.5 MG/3ML) 0.083% nebulizer solution 2.5 mg, 2.5 mg, Inhalation, Q6H PRN, Mikeal Hawthorne, Mohammad L, MD, 2.5 mg at 07/21/23 1252   ALPRAZolam (XANAX) tablet 0.5 mg, 0.5 mg, Oral, QHS PRN, Mikeal Hawthorne, Mohammad L, MD, 0.5 mg at 07/20/23 2050   alum & mag hydroxide-simeth (MAALOX/MYLANTA) 200-200-20 MG/5ML suspension 30 mL, 30 mL, Oral, Q4H PRN, Enedina Finner, MD, 30 mL at 07/20/23 2051   amLODipine (NORVASC) tablet 10 mg, 10 mg, Oral, Daily, Mikeal Hawthorne, Mohammad L, MD, 10 mg at 07/20/23 2050   ammonium lactate (LAC-HYDRIN) 12 % lotion 1 Application, 1 Application, Topical, BID, Earlie Lou L, MD, 1 Application at 07/21/23 1100   bisacodyl (DULCOLAX) EC tablet 10 mg, 10 mg, Oral, Daily, Enedina Finner, MD, 10 mg at 07/20/23 9147   cefTRIAXone (ROCEPHIN) 2 g in sodium chloride 0.9 % 100 mL IVPB, 2 g, Intravenous, Q24H, Garba, Mohammad L, MD, Last Rate: 200 mL/hr at 07/21/23 1100, 2 g at 07/21/23 1100   cloNIDine (CATAPRES) tablet 0.1 mg, 0.1 mg, Oral, BID, Garba, Mohammad L, MD, 0.1 mg at 07/21/23 0843   colchicine tablet 0.6-1.2 mg, 0.6-1.2 mg,  Oral, PRN, Earlie Lou L, MD   diphenhydrAMINE (BENADRYL) capsule 25 mg, 25 mg, Oral, Q6H PRN, Mikeal Hawthorne, Mohammad L, MD   enoxaparin (LOVENOX) injection 30 mg, 30 mg, Subcutaneous, Q24H, Jaynie Bream, RPH, 30 mg at 07/21/23  0846   feeding supplement (GLUCERNA SHAKE) (GLUCERNA SHAKE) liquid 237 mL, 237 mL, Oral, TID BM, Allena Katz, Sona, MD   fluticasone (FLONASE) 50 MCG/ACT nasal spray 2 spray, 2 spray, Each Nare, Daily PRN, Mikeal Hawthorne, Mohammad L, MD   insulin aspart (novoLOG) injection 0-5 Units, 0-5 Units, Subcutaneous, QHS, Enedina Finner, MD, 2 Units at 07/19/23 2219   insulin aspart (novoLOG) injection 0-9 Units, 0-9 Units, Subcutaneous, TID WC, Enedina Finner, MD, 1 Units at 07/21/23 1227   insulin glargine-yfgn (SEMGLEE) injection 25 Units, 25 Units, Subcutaneous, Daily, Enedina Finner, MD, 25 Units at 07/21/23 0846   levothyroxine (SYNTHROID) tablet 50 mcg, 50 mcg, Oral, Q0600, Earlie Lou L, MD, 50 mcg at 07/21/23 0458   meclizine (ANTIVERT) tablet 12.5 mg, 12.5 mg, Oral, TID PRN, Earlie Lou L, MD   metoprolol tartrate (LOPRESSOR) tablet 50 mg, 50 mg, Oral, BID, Mikeal Hawthorne, Mohammad L, MD, 50 mg at 07/21/23 0844   mometasone-formoterol (DULERA) 200-5 MCG/ACT inhaler 2 puff, 2 puff, Inhalation, BID, Earlie Lou L, MD, 2 puff at 07/21/23 0848   [START ON 07/22/2023] multivitamin with minerals tablet 1 tablet, 1 tablet, Oral, Daily, Enedina Finner, MD   nitroGLYCERIN (NITROSTAT) SL tablet 0.4 mg, 0.4 mg, Sublingual, Q5 min PRN, Mikeal Hawthorne, Mohammad L, MD   omega-3 acid ethyl esters (LOVAZA) capsule 1 g, 1 g, Oral, Daily, Garba, Mohammad L, MD, 1 g at 07/21/23 0843   ondansetron (ZOFRAN) tablet 4 mg, 4 mg, Oral, Q6H PRN **OR** ondansetron (ZOFRAN) injection 4 mg, 4 mg, Intravenous, Q6H PRN, Mikeal Hawthorne, Mohammad L, MD, 4 mg at 07/19/23 2024   pantoprazole (PROTONIX) EC tablet 40 mg, 40 mg, Oral, BID AC, Garba, Mohammad L, MD, 40 mg at 07/21/23 1541   rosuvastatin (CRESTOR) tablet 40 mg, 40 mg, Oral, Daily,  Mikeal Hawthorne, Mohammad L, MD, 40 mg at 07/21/23 0843   senna-docusate (Senokot-S) tablet 1 tablet, 1 tablet, Oral, BID, Enedina Finner, MD, 1 tablet at 07/20/23 0834   tamsulosin (FLOMAX) capsule 0.4 mg, 0.4 mg, Oral, QPC supper, Otelia Sergeant, RPH, 0.4 mg at 07/21/23 1541   Physical exam:  Vitals:   07/21/23 0742 07/21/23 0835 07/21/23 1228 07/21/23 1540  BP:  (!) 148/57  128/74  Pulse:  89  73  Resp:  18  18  Temp: (!) 101 F (38.3 C) (!) 102.7 F (39.3 C) 98.9 F (37.2 C) 98.2 F (36.8 C)  TempSrc: Oral  Oral   SpO2:  92%  98%  Weight:      Height:       Physical Exam Cardiovascular:     Rate and Rhythm: Normal rate and regular rhythm.     Heart sounds: Normal heart sounds.  Pulmonary:     Effort: Pulmonary effort is normal.     Breath sounds: Normal breath sounds.  Abdominal:     General: Bowel sounds are normal.     Palpations: Abdomen is soft.     Comments: No palpable hepatosplenomegaly  Lymphadenopathy:     Comments: Palpable left supraclavicular, left axillary and right inguinal adenopathy  Skin:    General: Skin is warm and dry.  Neurological:     Mental Status: He is alert and oriented to person, place, and time.           Latest Ref Rng & Units 07/20/2023   10:51 AM  CMP  Glucose 70 - 99 mg/dL 161   BUN 8 - 23 mg/dL 28   Creatinine 0.96 - 1.24 mg/dL 0.45   Sodium 409 -  145 mmol/L 128   Potassium 3.5 - 5.1 mmol/L 4.9   Chloride 98 - 111 mmol/L 97   CO2 22 - 32 mmol/L 24   Calcium 8.9 - 10.3 mg/dL 8.1       Latest Ref Rng & Units 07/19/2023   10:16 AM  CBC  WBC 4.0 - 10.5 K/uL 12.9   Hemoglobin 13.0 - 17.0 g/dL 8.9   Hematocrit 16.1 - 52.0 % 28.4   Platelets 150 - 400 K/uL 353     @IMAGES @  CT CHEST ABDOMEN PELVIS WO CONTRAST  Result Date: 07/18/2023 CLINICAL DATA:  Sepsis flu like symptoms abdominal surgery fever EXAM: CT CHEST, ABDOMEN AND PELVIS WITHOUT CONTRAST TECHNIQUE: Multidetector CT imaging of the chest, abdomen and pelvis was performed  following the standard protocol without IV contrast. RADIATION DOSE REDUCTION: This exam was performed according to the departmental dose-optimization program which includes automated exposure control, adjustment of the mA and/or kV according to patient size and/or use of iterative reconstruction technique. COMPARISON:  CT 06/23/2023 FINDINGS: CT CHEST FINDINGS Cardiovascular: Limited assessment without intravenous contrast. Moderate aortic atherosclerosis. Post CABG changes. Coronary vascular calcifications. Borderline cardiomegaly. No pericardial effusion. Mediastinum/Nodes: Midline trachea. No thyroid mass. Enlarged prevascular lymph node measuring 17 mm series 2, image 16 increased compared to prior. Mild diffuse enlargement of multiple prevascular nodes. Esophagus within normal limits. Multiple enlarged left axillary lymph nodes with dominant left subpectoral node measuring 5.7 x 3.9 cm, previously 4.3 x 3.5 cm. Interval enlargement of multiple additional left axillary nodes with associated soft tissue stranding. Incompletely visualized left supraclavicular mass measuring at least 6.9 by 5.3 cm, series 2, image 1, also increased compared to prior. Lungs/Pleura: Emphysema. No acute airspace disease, pleural effusion, or pneumothorax. No suspicious pulmonary nodules. Musculoskeletal: Sternotomy.  No acute osseous abnormality. CT ABDOMEN PELVIS FINDINGS Hepatobiliary: No focal liver abnormality is seen. No gallstones, gallbladder wall thickening, or biliary dilatation. Pancreas: Unremarkable. No pancreatic ductal dilatation or surrounding inflammatory changes. Spleen: Normal in size without focal abnormality. Adrenals/Urinary Tract: Adrenal glands are within normal limits. Moderate nonspecific perinephric fat stranding. No hydronephrosis. The bladder is unremarkable. Stomach/Bowel: Postsurgical changes of the stomach. No dilated small bowel. No acute bowel wall thickening. Negative appendix. Vascular/Lymphatic:  Moderate aortic atherosclerosis. Increased size of right Peri aortic retroperitoneal lymph node on series 2, image 52 measuring 1.6 cm. Multiple subcentimeter Peri aortic nodes mildly increased. Slight interval increase in size of multiple subcentimeter iliac nodes. Multiple enlarged right pelvic sidewall and external iliac nodes, for example 2 cm lymph node on series 2, image 105. Right external iliac node measuring 3.1 cm on series 2, image 110, previously 2.3 cm. Ill-defined right groin mass measuring 4.1 cm, previously 4.6 cm. Multiple additional mildly enlarged left groin lymph nodes. Reproductive: Slightly enlarged prostate Other: Negative for pelvic effusion or free air. Skin thickening and subcutaneous infiltration along the anterior abdominal wall possibly due to cutaneous injection. Musculoskeletal: No acute or suspicious osseous abnormality IMPRESSION: 1. Negative for acute airspace disease.  Emphysema. 2. Increased left axillary, mediastinal, and retroperitoneal adenopathy. Increased size of incompletely visualized left supraclavicular mass/suspected adenopathy. Persistent right pelvic, external iliac and right groin adenopathy. Findings could be secondary to metastatic disease or lymphoproliferative disease. Tissue sampling if not already performed is suggested. 3. Nonspecific bilateral perinephric stranding without hydronephrosis. No convincing CT evidence for acute intra-abdominal or pelvic abnormality. Aortic Atherosclerosis (ICD10-I70.0) and Emphysema (ICD10-J43.9). Electronically Signed   By: Jasmine Pang M.D.   On: 07/18/2023 18:07   CT CHEST  ABDOMEN PELVIS WO CONTRAST  Result Date: 06/23/2023 CLINICAL DATA:  respiratory illness, nondiagnostic xray, abdominal tenderness with recent surgery EXAM: CT CHEST, ABDOMEN AND PELVIS WITHOUT CONTRAST TECHNIQUE: Multidetector CT imaging of the chest, abdomen and pelvis was performed following the standard protocol without IV contrast. RADIATION DOSE  REDUCTION: This exam was performed according to the departmental dose-optimization program which includes automated exposure control, adjustment of the mA and/or kV according to patient size and/or use of iterative reconstruction technique. COMPARISON:  None Available. FINDINGS: CT CHEST FINDINGS Cardiovascular: Prominent heart size. No significant pericardial effusion. The thoracic aorta is normal in caliber. Severe atherosclerotic plaque of the thoracic aorta. Four-vessel coronary artery calcifications. The main pulmonary artery is normal in caliber. Mediastinum/Nodes: Left axillary lymphadenopathy with as an example a 2.1 Lymph node (2:18). Superiorly there is a 3.5 x 4.3 cm density along the left axilla that abuts the subclavian vasculature (2:12). Associated left axillary subcutaneus soft tissue fat stranding. No gross hilar adenopathy, noting limited sensitivity for the detection of hilar adenopathy on this noncontrast study. no enlarged mediastinal or right axillary lymph nodes. Thyroid gland, trachea, and esophagus demonstrate no significant findings. Elevated left hemidiaphragm. Lungs/Pleura: No focal consolidation. No pulmonary nodule. No pulmonary mass. No pleural effusion. No pneumothorax. Musculoskeletal: No chest wall abnormality. No suspicious lytic or blastic osseous lesions. No acute displaced fracture. Multilevel degenerative changes of the spine. Intact sternotomy wires. CT ABDOMEN PELVIS FINDINGS Hepatobiliary: No focal liver abnormality. No gallstones, gallbladder wall thickening, or pericholecystic fluid. No biliary dilatation. Pancreas: No focal lesion. Normal pancreatic contour. No surrounding inflammatory changes. No main pancreatic ductal dilatation. Spleen: Normal in size without focal abnormality. Adrenals/Urinary Tract: No adrenal nodule bilaterally. Nonspecific bilateral perinephric fat stranding. No nephrolithiasis and no hydronephrosis. No definite contour-deforming renal mass. No  ureterolithiasis or hydroureter. The urinary bladder is unremarkable. Stomach/Bowel: Stomach is within normal limits. No evidence of bowel wall thickening or dilatation. Appendix appears normal. Vascular/Lymphatic: No abdominal aorta or iliac aneurysm. Severe atherosclerotic plaque of the aorta and its branches. No abdominal lymphadenopathy. Enlarged right pelvic sidewall lymph nodes with associated fat stranding. As an example a 2.4 cm lymph node (2:109). Enlarged [place] lymph node measuring 2.3 cm (2:115). Asymmetrically prominent right inguinal lymphadenopathy with a 4.6 x 2.6 cm density within the left inguinal region that appears to abut the sartorius muscle (2:17). Some of the inguinal and [place] densities likely represent lymph nodes but abut the external iliac vasculature and are difficult to separate from vascular entities. Reproductive: Enlarged prostate measuring up to 5 cm. Other: No intraperitoneal free fluid. No intraperitoneal free gas. No organized fluid collection. Musculoskeletal: Anterior abdominal wall dermal irregular thickening and subcutaneus soft tissue edema likely related to medication injection (2:91, 69). Small fat containing umbilical hernia. No suspicious lytic or blastic osseous lesions. No acute displaced fracture. Multilevel degenerative changes of the spine. IMPRESSION: 1. Markedly limited evaluation on this noncontrast study. 2. Left axillary lymphadenopathy with associated left axillary subcutaneus soft tissue edema. Several lymph nodes noted as well as a 3.5 x 4.3 cm density along the left axilla that abuts the subclavian vasculature that likely represents a lymph node-differential diagnosis vascular etiology not excluded. Limited evaluation on this noncontrast study. Can be further evaluated with color Doppler flow ultrasound. 3. Right inguinal and right pelvic sidewall lymphadenopathy with associated subcutaneus soft tissue and intraperitoneal fat stranding. A 4.6 x 2.6 cm  density within the left inguinal region that appears to abut the sartorius muscle. Some of the inguinal and  [place] densities likely represent lymph nodes but abut the external iliac vasculature and are difficult to separate from vascular entities. Finding can be further evaluate evaluated on ultrasound. 4. Prostatomegaly. Electronically Signed   By: Tish Frederickson M.D.   On: 06/23/2023 01:59   DG Chest 2 View  Result Date: 06/22/2023 CLINICAL DATA:  Chest pain for several hours on the left, initial encounter EXAM: CHEST - 2 VIEW COMPARISON:  05/23/2023 FINDINGS: Cardiac shadow is enlarged. Postsurgical changes are again seen. Lungs are well aerated bilaterally. No focal infiltrate or effusion is seen. No bony abnormality is noted. IMPRESSION: No active cardiopulmonary disease. Electronically Signed   By: Alcide Clever M.D.   On: 06/22/2023 21:17    Assessment and plan- Patient is a 72 y.o. male admitted for fever weakness and weight loss found to have generalized adenopathy  Patient was found to have supraclavicular axillary mediastinal inguinal as well as retroperitoneal adenopathy on CT scans than [facility] in August 2024.  He underwent FNA of the left axillary lymph node which was inconclusive and not diagnostic for malignancy.  It is presently unclear if patient's symptoms of fever are secondary to his adenopathy which is possibly due to malignancy. I am checking hepatitis labs along with tumor lysis labs including uric acid phosphorus and LDH today.  He will need excisional biopsy of the lymph node and I have recommended general surgery to assess him.  Dr. Maia Plan has seen him today and plan is for excisional left axillary lymph node biopsy tomorrow.  I will see the patient after pathology results are back.  In the interim patient is undergoing workup by ID to see if there is any other etiology that can explain his fever   Thank you for this kind referral and the opportunity to participate in the care of  this patient   Visit Diagnosis 1. Weakness   2. Dehydration   3. Sepsis, due to unspecified organism, unspecified whether acute organ dysfunction present [facility])     Dr. Owens Shark, MD, MPH [facility] [phone] 07/21/2023

## 2023-07-21 NOTE — TOC Initial Note (Signed)
Transition of Care [facility]) - Initial/Assessment Note    Patient Details  Name: Roberto Ingram MRN: [id number] Date of Birth: 1951-02-25  [facility]) CM/SW Contact:    Darolyn Rua, LCSW Phone Number: 07/21/2023, 3:19 PM  Clinical Narrative:                  Patient from home with spouse presented to ED with generalized weakness and sore throat, fevers. Patient with recent GI Tumor post removal at [facility] .   Surgical consultation for excisional biopsy of the left axillary lymphadenopathy. Nephro consultation for AKI.   PT/OT recommendation for CIR, per rehab admissions coordinator patient does not meet criteria for admission currently, they will follow this week it patient becomes appropriate prior to beng medically stable.   Current continued medical workup.   PcP: Cheron Every, NP    Expected Discharge Plan:  (TBD) Barriers to Discharge: Continued Medical Work up   Patient Goals and CMS Choice Patient states their goals for this hospitalization and ongoing recovery are:: to go home CMS Medicare.gov Compare Post Acute Care list provided to:: Patient Choice offered to / list presented to : Patient      Expected Discharge Plan and Services       Living arrangements for the past 2 months: Single Family Home                                      Prior Living Arrangements/Services Living arrangements for the past 2 months: Single Family Home Lives with:: Spouse                   Activities of Daily Living Home Assistive Devices/Equipment: Environmental consultant (specify type) ADL Screening (condition at time of admission) Patient's cognitive ability adequate to safely complete daily activities?: Yes Is the patient deaf or have difficulty hearing?: No Does the patient have difficulty seeing, even when wearing glasses/contacts?: No Does the patient have difficulty concentrating, remembering, or making decisions?: Yes Patient able to express need for  assistance with ADLs?: Yes Does the patient have difficulty dressing or bathing?: Yes Independently performs ADLs?: No Communication: Independent Dressing (OT): Needs assistance Is this a change from baseline?: Pre-admission baseline Grooming: Needs assistance Is this a change from baseline?: Pre-admission baseline Feeding: Independent Bathing: Needs assistance Is this a change from baseline?: Pre-admission baseline Toileting: Needs assistance Is this a change from baseline?: Pre-admission baseline In/Out Bed: Needs assistance Is this a change from baseline?: Pre-admission baseline Walks in Home: Needs assistance Is this a change from baseline?: Pre-admission baseline Does the patient have difficulty walking or climbing stairs?: Yes Weakness of Legs: Both Weakness of Arms/Hands: Both  Permission Sought/Granted                  Emotional Assessment              Admission diagnosis:  Dehydration [E86.0] Weakness [R53.1] Sepsis secondary to UTI (HCC) [A41.9, N39.0] Sepsis, due to unspecified organism, unspecified whether acute organ dysfunction present [facility]) [A41.9] Patient Active Problem List   Diagnosis Date Noted   Protein-calorie malnutrition, severe 07/21/2023   Sepsis secondary to UTI (HCC) 07/18/2023   AKI (acute kidney injury) (HCC) 07/18/2023   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   History of stroke 02/11/2018   Obstructive sleep apnea on CPAP 02/11/2018   Hyperlipemia 03/10/2017   OSA (obstructive sleep apnea) 03/10/2017  Cerebrovascular accident (CVA) due to thrombosis of basilar artery (HCC)    Acute ischemic stroke (HCC) 01/24/2017   Essential hypertension 01/24/2017   Hypothyroidism (acquired) 01/24/2017   Type 2 diabetes mellitus with hyperlipidemia (HCC) 04/03/2015   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 [place] [address] Phone: [phone] Fax: [phone]     Social Determinants of Health (SDOH) Social History: SDOH Screenings   Food Insecurity: No Food Insecurity (07/18/2023)  Housing: Low Risk  (07/18/2023)  Transportation Needs: No Transportation Needs (07/18/2023)  Utilities: Not At Risk (07/18/2023)  Financial Resource Strain: Medium Risk (06/10/2023)   Received from [facility]  Physical Activity: Insufficiently Active (03/04/2018)  Social Connections: Socially Integrated (03/04/2018)  Stress: No Stress Concern Present (03/04/2018)  Tobacco Use: Medium Risk (07/19/2023)   SDOH Interventions:     Readmission Risk Interventions     No data to display

## 2023-07-21 NOTE — Consult Note (Signed)
[facility]  Total days of antibiotics 4 Reason for Consult: FUO   Referring Physician: patel  Principal Problem:   Sepsis secondary to UTI [facility]) Active Problems:   Type 2 diabetes mellitus with hyperlipidemia (HCC)   Essential hypertension   Hypothyroidism (acquired)   Hyperlipemia   OSA (obstructive sleep apnea)   Obstructive sleep apnea on CPAP   AKI (acute kidney injury) (HCC)   Protein-calorie malnutrition, severe    HPI: Roberto Ingram is a 72 y.o. 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 He was admitted on 9/6 due to fevers and progressive weakness and cough. He was recently placed on bactrim for possible uti. On admit, his labs were noteable for elevated wbc of 13.6 (however that is the trend for the past 2 months in epic), hyponatremia of 129, cr of 2.75 up from baseline of 2. He was ruled out for covid and RVP. Initial blood cx ngtd, but urine cx on 9/8 (day 2 of abtx) is NGTD. 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 MRI abdomen had been done in June, and did not show any adenopathy -8/24 ER visit for chest pain, CT CAP done, showed left axillary adenopathy/masses up to 4.3 cm, and inguinal/pelvic adenopathy up to 4.6 cm   Past Medical History:  Diagnosis Date   Anemia    Anxiety    Generalized anxiety disorder   Asthmatic bronchitis    Atopic dermatitis    BPH (benign prostatic hyperplasia)    Cardiovascular disease     Carotid disease, bilateral (HCC)    Colon polyp    Constipation    Coronary artery disease    Diabetes mellitus without complication (HCC)    Diabetic retinopathy (HCC)    GERD (gastroesophageal reflux disease)    Glaucoma of left eye    Gout    History of GI bleed    Hypercholesterolemia    Hypertension    Hypomagnesemia    Hypothyroidism    Insomnia    Mixed hyperlipidemia    Myocardial infarction (HCC)    Pneumonia    Renal disorder    Renal insufficiency    Sleep apnea    wears oxygen at night (unable to tolerate CPAP)   Stroke (HCC)    Stroke (HCC)    Thyroid disease    Vitamin D deficiency     Allergies:  Allergies  Allergen Reactions   Antihistamines, Chlorpheniramine-Type Swelling    Prostat   Diphenhydramine Hcl Swelling   Fluorescein Dermatitis, Hives, Itching and Other (See Comments)   Iodinated Contrast Media Other (See Comments)   Lisinopril Swelling   Ezetimibe Hives and Other (See Comments)   Furosemide Swelling   Metformin Hives and Other (See Comments)   Metoprolol Itching   Metoprolol Tartrate Itching   Nsaids Other (See Comments)    Does take aspirin   Sulfa Antibiotics Hives and Other (See Comments)   Brilinta [Ticagrelor] Other (See Comments)    Headaches,  off balance, no appetite   Diphenhydramine Hcl Other (See Comments)    "The more I take, it makes my prostate swell..."   Other Itching and Other (See Comments)    FA Dye Prostate swelling FA Dye  Prostate swelling     MEDICATIONS:  amLODipine  10 mg Oral Daily   ammonium lactate  1 Application Topical BID   bisacodyl  10 mg Oral Daily   cloNIDine  0.1 mg Oral BID   enoxaparin (LOVENOX) injection  30 mg Subcutaneous Q24H   feeding supplement (GLUCERNA SHAKE)  237 mL Oral TID BM   insulin aspart  0-5 Units Subcutaneous QHS   insulin aspart  0-9 Units Subcutaneous TID WC   insulin glargine-yfgn  25 Units Subcutaneous Daily   levothyroxine  50 mcg Oral Q0600   metoprolol  tartrate  50 mg Oral BID   mometasone-formoterol  2 puff Inhalation BID   [START ON 07/22/2023] multivitamin with minerals  1 tablet Oral Daily   omega-3 acid ethyl esters  1 g Oral Daily   pantoprazole  40 mg Oral BID AC   rosuvastatin  40 mg Oral Daily   senna-docusate  1 tablet Oral BID   tamsulosin  0.4 mg Oral QPC supper    Social History   Tobacco Use   Smoking status: Former    Current packs/day: 0.00    Types: Cigarettes    Quit date: 06/11/1993    Years since quitting: 30.1   Smokeless tobacco: Never  Vaping Use   Vaping status: Never Used  Substance Use Topics   Alcohol use: No   Drug use: No    Family History  Problem Relation Age of Onset   Atrial fibrillation Mother    Heart disease Mother    Stroke Mother    Bell's palsy Mother    Kidney disease Mother    Cancer Mother    Diabetes Father    Arthritis Father    Stroke Brother    Diabetes Brother    Lung cancer Brother    Heart attack Brother    Cancer Brother    Heart disease Sister    COPD Sister    Throat cancer Sister     Review of Systems -  12 point ros is negative except what is mentioned above  OBJECTIVE: Temp:  [98.2 F (36.8 C)-102.7 F (39.3 C)] 98.2 F (36.8 C) (09/09 1540) Pulse Rate:  [73-91] 73 (09/09 1540) Resp:  [18-20] 18 (09/09 1540) BP: (128-148)/(57-74) 128/74 (09/09 1540) SpO2:  [92 %-98 %] 98 % (09/09 1540) Physical Exam  Constitutional: He is oriented to person, place, and time. He appears well-developed and well-nourished. No distress.  HENT: +left sided supraclavicular and axillary LN+ Mouth/Throat: Oropharynx is clear and moist. No oropharyngeal exudate.  Cardiovascular: Normal rate, regular rhythm and normal heart sounds. Exam reveals no gallop and no friction rub.  No murmur heard.  Pulmonary/Chest: Effort normal and breath sounds normal. No respiratory distress. He has no wheezes.  Abdominal: Soft. Bowel sounds are normal. He exhibits no distension. There is no  tenderness.  Lymphadenopathy:  He has no cervical adenopathy.  Neurological: He is alert and oriented to person, place, and time.  Skin: Skin is warm and dry. No rash noted. No erythema.  Psychiatric: He has a normal mood and affect. His behavior is normal.    LABS: Results for orders placed or performed during the hospital encounter of 07/18/23 (from the past 48 hour(s))  Glucose, capillary  Status: Abnormal   Collection Time: 07/19/23  9:28 PM  Result Value Ref Range   Glucose-Capillary 245 (H) 70 - 99 mg/dL    Comment: Glucose reference range applies only to samples taken after fasting for at least 8 hours.  Glucose, capillary     Status: Abnormal   Collection Time: 07/20/23  8:09 AM  Result Value Ref Range   Glucose-Capillary 179 (H) 70 - 99 mg/dL    Comment: Glucose reference range applies only to samples taken after fasting for at least 8 hours.  Respiratory (~20 pathogens) panel by PCR     Status: None   Collection Time: 07/20/23 10:30 AM   Specimen: Nasopharyngeal Swab; Respiratory  Result Value Ref Range   Adenovirus NOT DETECTED NOT DETECTED   Coronavirus 229E NOT DETECTED NOT DETECTED    Comment: (NOTE) The Coronavirus on the Respiratory Panel, DOES NOT test for the novel  Coronavirus (2019 nCoV)    Coronavirus HKU1 NOT DETECTED NOT DETECTED   Coronavirus NL63 NOT DETECTED NOT DETECTED   Coronavirus OC43 NOT DETECTED NOT DETECTED   Metapneumovirus NOT DETECTED NOT DETECTED   Rhinovirus / Enterovirus NOT DETECTED NOT DETECTED   Influenza A NOT DETECTED NOT DETECTED   Influenza B NOT DETECTED NOT DETECTED   Parainfluenza Virus 1 NOT DETECTED NOT DETECTED   Parainfluenza Virus 2 NOT DETECTED NOT DETECTED   Parainfluenza Virus 3 NOT DETECTED NOT DETECTED   Parainfluenza Virus 4 NOT DETECTED NOT DETECTED   Respiratory Syncytial Virus NOT DETECTED NOT DETECTED   Bordetella pertussis NOT DETECTED NOT DETECTED   Bordetella Parapertussis NOT DETECTED NOT DETECTED    Chlamydophila pneumoniae NOT DETECTED NOT DETECTED   Mycoplasma pneumoniae NOT DETECTED NOT DETECTED    Comment: Performed at [facility], [address] [place], [place] [address]  Basic metabolic panel     Status: Abnormal   Collection Time: 07/20/23 10:51 AM  Result Value Ref Range   Sodium 128 (L) 135 - 145 mmol/L   Potassium 4.9 3.5 - 5.1 mmol/L   Chloride 97 (L) 98 - 111 mmol/L   CO2 24 22 - 32 mmol/L   Glucose, Bld 203 (H) 70 - 99 mg/dL    Comment: Glucose reference range applies only to samples taken after fasting for at least 8 hours.   BUN 28 (H) 8 - 23 mg/dL   Creatinine, Ser 9.14 (H) 0.61 - 1.24 mg/dL   Calcium 8.1 (L) 8.9 - 10.3 mg/dL   GFR, Estimated 19 (L) >60 mL/min    Comment: (NOTE) Calculated using the CKD-EPI Creatinine Equation (2021)    Anion gap 7 5 - 15    Comment: Performed at [facility], [address] [place], [place] [address]  Glucose, capillary     Status: Abnormal   Collection Time: 07/20/23 11:19 AM  Result Value Ref Range   Glucose-Capillary 200 (H) 70 - 99 mg/dL    Comment: Glucose reference range applies only to samples taken after fasting for at least 8 hours.  Urine Culture (for pregnant, neutropenic or urologic patients or patients with an indwelling urinary catheter)     Status: None   Collection Time: 07/20/23 11:28 AM   Specimen: Urine, Clean Catch  Result Value Ref Range   Specimen Description      URINE, CLEAN CATCH Performed at [facility], [address], [place], [place] [address]    Special Requests      NONE Performed at [facility], [place]  [address], [place], [place] [address]    Culture      NO GROWTH Performed at [facility], [address], [place], [place] [address]    Report Status 07/21/2023 FINAL   Glucose, capillary     Status: Abnormal   Collection Time: 07/20/23  4:11 PM  Result Value Ref Range   Glucose-Capillary 185 (H) 70 - 99 mg/dL    Comment: Glucose reference  range applies only to samples taken after fasting for at least 8 hours.  Glucose, capillary     Status: Abnormal   Collection Time: 07/20/23  9:02 PM  Result Value Ref Range   Glucose-Capillary 190 (H) 70 - 99 mg/dL    Comment: Glucose reference range applies only to samples taken after fasting for at least 8 hours.  Glucose, capillary     Status: Abnormal   Collection Time: 07/21/23  8:38 AM  Result Value Ref Range   Glucose-Capillary 147 (H) 70 - 99 mg/dL    Comment: Glucose reference range applies only to samples taken after fasting for at least 8 hours.  Lactate dehydrogenase     Status: None   Collection Time: 07/21/23 10:11 AM  Result Value Ref Range   LDH 179 98 - 192 U/L    Comment: Performed at [facility], [address] [place], [place] [address]  Uric acid     Status: None   Collection Time: 07/21/23 10:11 AM  Result Value Ref Range   Uric Acid, Serum 8.3 3.7 - 8.6 mg/dL    Comment: Performed at [facility], [address] [place], [place] [address]  Phosphorus     Status: Abnormal   Collection Time: 07/21/23 10:11 AM  Result Value Ref Range   Phosphorus 2.1 (L) 2.5 - 4.6 mg/dL    Comment: Performed at [facility], [address] [place], [place] [address]  Hepatitis B core antibody, total     Status: None   Collection Time: 07/21/23 10:11 AM  Result Value Ref Range   Hep B Core Total Ab NON REACTIVE NON REACTIVE    Comment: Performed at [facility], [address] [place], [place] [address]  Hepatitis B surface antigen     Status: None   Collection Time: 07/21/23 10:14 AM  Result Value Ref Range   Hepatitis B Surface Ag NON REACTIVE NON REACTIVE    Comment: Performed at [facility], [address] [place], [place] [address]  Glucose, capillary     Status: Abnormal   Collection Time: 07/21/23 12:09 PM  Result Value Ref Range   Glucose-Capillary 144 (H) 70 - 99 mg/dL    Comment: Glucose reference range applies only  to samples taken after fasting for at least 8 hours.  Glucose, capillary     Status: None   Collection Time: 07/21/23  4:55 PM  Result Value Ref Range   Glucose-Capillary 84 70 - 99 mg/dL    Comment: Glucose reference range applies only to samples taken after fasting for at least 8 hours.    MICRO: reviewed IMAGING: Fever curve   Collected June 06, 2023 at 0825 hrs., into formalin at 0825 hrs., and out of formalin June 09, 2023 at 1630 hrs.  Procedure: Laparoscopic gastric wedge resection  Dimensions: 5.0 x 3.0 x 2.5 cm  Inking: 8.5 cm long stapled parenchymal margin-underlying tissue: Black and serosa: Blue  Gross findings: -Tumor site: Gastric wedge -Tumor size: 3.0 x 3.0 x 2.1  cm -Tumor description: Firm, white-tan, and focally hemorrhagic; abuts the overlying mucosa and exhibits a focal 0.5 x 0.4 cm area of dimpling. -Distance of tumor from stapled parenchymal margin: 0.2 cm -Distance to serosa: Abuts and bulges  Additional gross findings: -The serosa is pink-tan with a 2.0 x 1.5 cm area of hyperemia within 0.4 cm of the stapled margin.  -Scant adipose is attached. -The remainder the mucosa is pink-tan and otherwise grossly unremarkable. ------------------- CT ABDOMEN PELVIS FINDINGS  Hepatobiliary: No focal liver abnormality. No gallstones, gallbladder wall thickening, or pericholecystic fluid. No biliary dilatation.  Pancreas: No focal lesion. Normal pancreatic contour. No surrounding inflammatory changes. No main pancreatic ductal dilatation.  Spleen: Normal in size without focal abnormality.  Adrenals/Urinary Tract:  No adrenal nodule bilaterally.  Nonspecific bilateral perinephric fat stranding. No nephrolithiasis and no hydronephrosis. No definite contour-deforming renal mass.  No ureterolithiasis or hydroureter.  The urinary bladder is unremarkable.  Stomach/Bowel: Stomach is within normal limits. No evidence of bowel wall thickening or dilatation.  Appendix appears normal.  Vascular/Lymphatic: No abdominal aorta or iliac aneurysm. Severe atherosclerotic plaque of the aorta and its branches. No abdominal lymphadenopathy. Enlarged right pelvic sidewall lymph nodes with associated fat stranding. As an example a 2.4 cm lymph node (2:109). Enlarged [place] lymph node measuring 2.3 cm (2:115). Asymmetrically prominent right inguinal lymphadenopathy with a 4.6 x 2.6 cm density within the left inguinal region that appears to abut the sartorius muscle (2:17). Some of the inguinal and [place] densities likely represent lymph nodes but abut the external iliac vasculature and are difficult to separate from vascular entities.  Reproductive: Enlarged prostate measuring up to 5 cm.  Other: No intraperitoneal free fluid. No intraperitoneal free gas. No organized fluid collection.  Musculoskeletal:  Anterior abdominal wall dermal irregular thickening and subcutaneus soft tissue edema likely related to medication injection (2:91, 69). Small fat containing umbilical hernia.  No suspicious lytic or blastic osseous lesions. No acute displaced fracture. Multilevel degenerative changes of the spine.  IMPRESSION: 1. Markedly limited evaluation on this noncontrast study. 2. Left axillary lymphadenopathy with associated left axillary subcutaneus soft tissue edema. Several lymph nodes noted as well as a 3.5 x 4.3 cm density along the left axilla that abuts the subclavian vasculature that likely represents a lymph node-differential diagnosis vascular etiology not excluded. Limited evaluation on this noncontrast study. Can be further evaluated with color Doppler flow ultrasound. 3. Right inguinal and right pelvic sidewall lymphadenopathy with associated subcutaneus soft tissue and intraperitoneal fat stranding. A 4.6 x 2.6 cm density within the left inguinal region that appears to abut the sartorius muscle. Some of the inguinal and [place] densities  likely represent lymph nodes but abut the external iliac vasculature and are difficult to separate from vascular entities. Finding can be further evaluate evaluated on ultrasound. 4. Prostatomegaly.  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 Concern that FUO is related to malignancy. Agree with plan to get excisional lymph node biopsy for further identification of lymphadenopathy  In terms of abtx, plan to finish out 5 day course of abtx and watch off of abtx  Continue with supportive care with fevers, will check sed rate, crp,and follow up on cultures. Thus far, they are negative.   Duke Salvia Drue Second MD MPH [facility] [phone]

## 2023-07-21 NOTE — Plan of Care (Signed)
  Problem: Education: Goal: Knowledge of General Education information will improve Description: Including pain rating scale, medication(s)/side effects and non-pharmacologic comfort measures Outcome: Progressing   Problem: Health Behavior/Discharge Planning: Goal: Ability to manage health-related needs will improve Outcome: Not Progressing   Problem: Clinical Measurements: Goal: Ability to maintain clinical measurements within normal limits will improve Outcome: Progressing Goal: Will remain free from infection Outcome: Progressing   Problem: Clinical Measurements: Goal: Will remain free from infection Outcome: Progressing

## 2023-07-21 NOTE — Progress Notes (Signed)
Physical Therapy Treatment Patient Details Name: Roberto Ingram MRN: [id number] DOB: 1951/04/25 Today's Date: 07/21/2023   History of Present Illness Patient is a 72 year old male with generalized weakness, sore throat, dry mouth, diffuse body aches. Sepsis with suspected UTI. History of recent G.I. stromal tumor resection at [facility].    PT Comments  Notable improvement in functional ability, level of assist required and overall activity tolerance this date.  Completing all transfers, standing balance and gait (200') with RW, cga/min assist.  Demonstrates reciprocal stepping pattern with fair step height/length; mild listing towards R with fatigue, cogntive distraction, obvious visual preference to R with functional activities. Does turn head towards left with purposeful cuing/activity, but often ceases gait to complete task (i.e., reading room signs). Very minimal/no ability to modulate gait speed with cuing, preferring to maintain consistent cadence throughout gait pattern   Do recommend continued use of RW and +1 assist for all mobility at this time; however, given improvement in status, discharge recommendations updated to reflect.    If plan is discharge home, recommend the following: A little help with walking and/or transfers;A little help with bathing/dressing/bathroom   Can travel by private vehicle        Equipment Recommendations       Recommendations for Other Services       Precautions / Restrictions Precautions Precautions: Fall Restrictions Weight Bearing Restrictions: No     Mobility  Bed Mobility               General bed mobility comments: seated edge of bed beginning/end of session; wife present at bedside    Transfers Overall transfer level: Needs assistance Equipment used: Rolling walker (2 wheels) Transfers: Sit to/from Stand Sit to Stand: Contact guard assist, Min assist           General transfer comment: cuing for hand placement to prevent  pulling on RW    Ambulation/Gait Ambulation/Gait assistance: Contact guard assist Gait Distance (Feet): 200 Feet Assistive device: Rolling walker (2 wheels)         General Gait Details: reciprocal stepping pattern with fair step height/length; mild listing towards R with fatigue, cogntive distraction, obvious visual preference to R with functional activities.  Does turn head towards left with purposeful cuing/activity, but often ceases gait to complete task (i.e., reading room signs).  Very minimal/no ability to modulate gait speed with cuing, preferring to maintain consistent cadence throughout gait pattern   Stairs             Wheelchair Mobility     Tilt Bed    Modified Rankin (Stroke Patients Only)       Balance Overall balance assessment: Needs assistance Sitting-balance support: No upper extremity supported, Feet supported Sitting balance-Leahy Scale: Good     Standing balance support: Bilateral upper extremity supported Standing balance-Leahy Scale: Fair                              Cognition Arousal: Alert Behavior During Therapy: WFL for tasks assessed/performed                                   General Comments: alert and oriented to self, location and general situation; mild delay in processing and task initiation; limited insight into situation and functional deficits; limited recall/retention of new information        Exercises Other  Exercises Other Exercises: Sit/stand x3 with RW, cga/min assist-cuing for hand placement; emphasis on closed-chain strength/activation of LEs with transitinoal movements. Mild abdominal flaring with static standing, but improved balance and orientation in A/P plane today    General Comments        Pertinent Vitals/Pain Pain Assessment Pain Assessment: No/denies pain    Home Living                          Prior Function            PT Goals (current goals can now be  found in the care plan section) Acute Rehab PT Goals Patient Stated Goal: spouse would like the patient to be stronger and go home if able PT Goal Formulation: With patient/family Time For Goal Achievement: 08/03/23 Potential to Achieve Goals: Good Progress towards PT goals: Progressing toward goals    Frequency    Min 1X/week      PT Plan      Co-evaluation              AM-PAC PT "6 Clicks" Mobility   Outcome Measure  Help needed turning from your back to your side while in a flat bed without using bedrails?: A Little Help needed moving from lying on your back to sitting on the side of a flat bed without using bedrails?: A Little Help needed moving to and from a bed to a chair (including a wheelchair)?: A Little Help needed standing up from a chair using your arms (e.g., wheelchair or bedside chair)?: A Little Help needed to walk in hospital room?: A Little Help needed climbing 3-5 steps with a railing? : A Little 6 Click Score: 18    End of Session Equipment Utilized During Treatment: Gait belt Activity Tolerance: Patient tolerated treatment well Patient left: in bed;with call bell/phone within reach;with bed alarm set;with family/visitor present Nurse Communication: Mobility status PT Visit Diagnosis: Unsteadiness on feet (R26.81);Muscle weakness (generalized) (M62.81)     Time: 1191-4782 PT Time Calculation (min) (ACUTE ONLY): 26 min  Charges:    $Gait Training: 8-22 mins $Therapeutic Activity: 8-22 mins PT General Charges $$ ACUTE PT VISIT: 1 Visit                     Betina Puckett H. Manson Passey, PT, DPT, NCS 07/21/23, 4:20 PM [phone]

## 2023-07-21 NOTE — Progress Notes (Addendum)
Initial Nutrition Assessment  DOCUMENTATION CODES:   Severe malnutrition in context of acute illness/injury  INTERVENTION:   Glucerna Shake po TID, each supplement provides 220 kcal and 10 grams of protein  MVI po daily   Liberalize diet   Pt at high refeed risk; recommend monitor potassium, magnesium and phosphorus labs daily until stable  NUTRITION DIAGNOSIS:   Severe Malnutrition related to acute illness as evidenced by moderate muscle depletion, 5 percent weight loss in 1 month.  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  Met with pt and pt's wife in room today. Pt reports decreased appetite and oral intake since June. Pt reports that in June, he suffered from a raccoon bite and had to be hospitalized. Then in July, he reports that he had part of his stomach resected for a GIST tumor. Pt has lost 12lbs(5%) over the past month; this is significant weight loss. Pt reports that he has been drinking chocolate Glucerna at home. Pt reports that his oral intake is poor in hospital. Pt reports that he does not like the hospital food. Recommend continue the Glucerna supplements. RD will liberalize pt's diet. Pt is likely at refeed risk.   Medications reviewed and include: aspirin, dulcolax, plavix, lovenox, insulin, synthroid, lovaza, senokot, protonix, ceftriaxone  Labs reviewed: Na 128(L), K 4.9 wnl, BUN 28(H), creat 3.27(H), P 2.1(L) Wbc- 12.9(H), Hgb 8.9(L), Hct 28.4(L) Cbgs- 144, 147 x 24 hrs AIC 7.8(H)- 9/7  NUTRITION - FOCUSED PHYSICAL EXAM:  Flowsheet Row Most Recent  Value  Orbital Region No depletion  Upper Arm Region Mild depletion  Thoracic and Lumbar Region No depletion  Buccal Region No depletion  Temple Region No depletion  Clavicle Bone Region Mild depletion  Clavicle and Acromion Bone Region Mild depletion  Scapular Bone Region No depletion  Dorsal Hand Mild depletion  Patellar Region Severe depletion  Anterior Thigh Region Severe depletion  Posterior Calf Region Severe depletion  Edema (RD Assessment) Mild  Hair Reviewed  Eyes Reviewed  Mouth Reviewed  Skin Reviewed  Nails Reviewed   Diet Order:   Diet Order             Diet NPO time specified  Diet effective midnight           Diet Carb Modified Fluid consistency: Thin; Room service appropriate? Yes  Diet effective now                  EDUCATION NEEDS:   Education needs have been addressed  Skin:  Skin Assessment: Reviewed RN Assessment  Last BM:  9/8  Height:   Ht Readings from Last 1 Encounters:  07/18/23 6' (1.829 m)    Weight:   Wt Readings from Last 1 Encounters:  07/18/23 97.5 kg    Ideal Body Weight:  80.9 kg  BMI:  Body mass index is 29.16 kg/m.  Estimated Nutritional Needs:   Kcal:  2200-2500kcal/day  Protein:  110-125g/day  Fluid:  2.0-2.3L/day  Betsey Holiday MS, RD, LDN Please refer to [facility] for RD and/or RD on-call/weekend/after hours  pager

## 2023-07-21 NOTE — Progress Notes (Signed)
Mobility Specialist - Progress Note   07/21/23 1137  Mobility  Activity Ambulated with assistance in hallway  Level of Assistance Standby assist, set-up cues, supervision of patient - no hands on  Assistive Device Front wheel walker  Distance Ambulated (ft) 190 ft  Activity Response Tolerated well  $Mobility charge 1 Mobility     Post-mobility: 72 HR, 87-91% SpO2   Pt lying in bed upon arrival, utilizing RA. Pt agreeable to activity. Pt completed bed mobility minA. Denied dizziness. VC for hand placement to stand. Increased time to process and respond intermittently. Pt stood to RW and ambulated in hallway with minG. No complaints. LOB x1 during head turning to converse with another staff member, requiring minA to correct. Pt returned to EOB voicing mild fatigue and SOB, O2 87% but quickly recovered to low 90s with PLB. Pt left semi-supine with alarm set, needs in reach.    Filiberto Pinks Mobility Specialist 07/21/23, 11:57 AM

## 2023-07-21 NOTE — Progress Notes (Signed)
[facility]  ROUNDING NOTE   Subjective:   Roberto Ingram is a 72 year old male with past medical conditions including diabetes, GERD, bronchitis, CAD, BPH, hypertension, thyroid disease, and chronic kidney disease stage IIIb.  Patient presents to the emergency department complaining of fatigue and fevers.  Patient has been admitted for Dehydration [E86.0] Weakness [R53.1] Sepsis secondary to UTI (HCC) [A41.9, N39.0] Sepsis, due to unspecified organism, unspecified whether acute organ dysfunction present [facility]) [A41.9]  Patient seen sitting up in bed, wife at bedside.  Patient states he continues to feel bad, weakness, sore throat, and dry mouth.  Wife states patient has had poor oral intake since admission, decreased intake prior to admission.  Patient states mouth is dry and makes it difficult to eat.  Denies nausea or vomiting.  Denies food distaste.  Denies NSAID use.  Creatinine on admission is 2.75 with GFR 24.  Creatinine continues to increase during this admission to 3.27 today.CT chest/abd/pelvis negative for hydronephrosis.  We have been consulted for acute kidney injury.   Objective:  Vital signs in last 24 hours:  Temp:  [98.4 F (36.9 C)-102.7 F (39.3 C)] 98.9 F (37.2 C) (09/09 1228) Pulse Rate:  [70-91] 89 (09/09 0835) Resp:  [18-20] 18 (09/09 0835) BP: (106-148)/(45-69) 148/57 (09/09 0835) SpO2:  [92 %-95 %] 92 % (09/09 0835)  Weight change:  Filed Weights   07/18/23 1341 07/18/23 2028  Weight: 97.1 kg 97.5 kg    Intake/Output: I/O last 3 completed shifts: In: 1051.5 [P.O.:540; I.V.:410.6; IV Piggyback:100.9] Out: -    Intake/Output this shift:  No intake/output data recorded.  Physical Exam: General: NAD  Head: Normocephalic, atraumatic.  Dry oral mucosal membranes  Eyes: Anicteric,  Lungs:  Clear to auscultation, normal effort  Heart: Regular rate and rhythm  Abdomen:  Soft, nontender,   Extremities:  2+ peripheral edema.  Neurologic:  Nonfocal, moving all four extremities  Skin: No lesions  Access: None    Basic Metabolic Panel: Recent Labs  Lab 07/18/23 1343 07/19/23 1016 07/20/23 1051 07/21/23 1011  NA 129* 128* 128*  --   K 4.7 4.7 4.9  --   CL 96* 97* 97*  --   CO2 25 23 24   --   GLUCOSE 257* 265* 203*  --   BUN 34* 30* 28*  --   CREATININE 2.75* 2.80* 3.27*  --   CALCIUM 8.4* 7.8* 8.1*  --   PHOS  --   --   --  2.1*    Liver Function Tests: Recent Labs  Lab 07/18/23 1616 07/19/23 1016  AST 18 17  ALT 16 16  ALKPHOS 60 58  BILITOT 0.6 0.5  PROT 7.3 6.8  ALBUMIN 2.6* 2.4*   Recent Labs  Lab 07/18/23 1616  LIPASE 42   No results for input(s): "AMMONIA" in the last 168 hours.  CBC: Recent Labs  Lab 07/18/23 1343 07/19/23 1016  WBC 13.6* 12.9*  HGB 9.7* 8.9*  HCT 31.2* 28.4*  MCV 85.5 83.8  PLT 345 353    Cardiac Enzymes: No results for input(s): "CKTOTAL", "CKMB", "CKMBINDEX", "TROPONINI" in the last 168 hours.  BNP: Invalid input(s): "POCBNP"  CBG: Recent Labs  Lab 07/20/23 1119 07/20/23 1611 07/20/23 2102 07/21/23 0838 07/21/23 1209  GLUCAP 200* 185* 190* 147* 144*    Microbiology: Results for orders placed or performed during the hospital encounter of 07/18/23  Blood culture (routine x 2)     Status: None (Preliminary result)   Collection Time: 07/18/23  1:46 PM   Specimen: BLOOD  Result Value Ref Range Status   Specimen Description BLOOD BLOOD LEFT ARM  Final   Special Requests   Final    BOTTLES DRAWN AEROBIC AND ANAEROBIC Blood Culture adequate volume   Culture   Final    NO GROWTH 3 DAYS Performed at [facility], [address] [place], [place] [address]    Report Status PENDING  Incomplete  SARS Coronavirus 2 by RT PCR (hospital order, performed in [facility] hospital lab) *cepheid single result test* Anterior Nasal Swab     Status: None   Collection Time: 07/18/23  1:47 PM   Specimen: Anterior Nasal Swab  Result Value Ref Range Status    SARS Coronavirus 2 by RT PCR NEGATIVE NEGATIVE Final    Comment: (NOTE) SARS-CoV-2 target nucleic acids are NOT DETECTED.  The SARS-CoV-2 RNA is generally detectable in upper and lower respiratory specimens during the acute phase of infection. The lowest concentration of SARS-CoV-2 viral copies this assay can detect is 250 copies / mL. A negative result does not preclude SARS-CoV-2 infection and should not be used as the sole basis for treatment or other patient management decisions.  A negative result may occur with improper specimen collection / handling, submission of specimen other than nasopharyngeal swab, presence of viral mutation(s) within the areas targeted by this assay, and inadequate number of viral copies (<250 copies / mL). A negative result must be combined with clinical observations, patient history, and epidemiological information.  Fact Sheet for Patients:   RoadLapTop.co.za  Fact Sheet for Healthcare Providers: http://kim-miller.com/  This test is not yet approved or  cleared by the [place] FDA and has been authorized for detection and/or diagnosis of SARS-CoV-2 by FDA under an Emergency Use Authorization (EUA).  This EUA will remain in effect (meaning this test can be used) for the duration of the COVID-19 declaration under Section 564(b)(1) of the Act, 21 U.S.C. section 360bbb-3(b)(1), unless the authorization is terminated or revoked sooner.  Performed at [facility], [address] [place], [place] [address]   Group A Strep by PCR [facility] Only)     Status: None   Collection Time: 07/18/23  4:16 PM   Specimen: Throat; Sterile Swab  Result Value Ref Range Status   Group A Strep by PCR NOT DETECTED NOT DETECTED Final    Comment: Performed at [facility], [address] [place], [place] [address]  Culture, blood (Routine X 2) w Reflex to ID Panel     Status: None (Preliminary result)    Collection Time: 07/19/23  1:09 PM   Specimen: BLOOD  Result Value Ref Range Status   Specimen Description BLOOD BLOOD RIGHT HAND  Final   Special Requests   Final    BOTTLES DRAWN AEROBIC AND ANAEROBIC Blood Culture results may not be optimal due to an excessive volume of blood received in culture bottles   Culture   Final    NO GROWTH 2 DAYS Performed at [facility], [address], [place], [place] [address]    Report Status PENDING  Incomplete  Respiratory (~20 pathogens) panel by PCR     Status: None   Collection Time: 07/20/23 10:30 AM   Specimen: Nasopharyngeal Swab; Respiratory  Result Value Ref Range Status   Adenovirus NOT DETECTED NOT DETECTED Final   Coronavirus 229E NOT DETECTED NOT DETECTED Final    Comment: (NOTE) The Coronavirus on the Respiratory Panel, DOES NOT test for the  novel  Coronavirus (2019 nCoV)    Coronavirus HKU1 NOT DETECTED NOT DETECTED Final   Coronavirus NL63 NOT DETECTED NOT DETECTED Final   Coronavirus OC43 NOT DETECTED NOT DETECTED Final   Metapneumovirus NOT DETECTED NOT DETECTED Final   Rhinovirus / Enterovirus NOT DETECTED NOT DETECTED Final   Influenza A NOT DETECTED NOT DETECTED Final   Influenza B NOT DETECTED NOT DETECTED Final   Parainfluenza Virus 1 NOT DETECTED NOT DETECTED Final   Parainfluenza Virus 2 NOT DETECTED NOT DETECTED Final   Parainfluenza Virus 3 NOT DETECTED NOT DETECTED Final   Parainfluenza Virus 4 NOT DETECTED NOT DETECTED Final   Respiratory Syncytial Virus NOT DETECTED NOT DETECTED Final   Bordetella pertussis NOT DETECTED NOT DETECTED Final   Bordetella Parapertussis NOT DETECTED NOT DETECTED Final   Chlamydophila pneumoniae NOT DETECTED NOT DETECTED Final   Mycoplasma pneumoniae NOT DETECTED NOT DETECTED Final    Comment: Performed at [facility], [address] [place], [place] [address]  Urine Culture (for pregnant, neutropenic or urologic patients or patients with an indwelling urinary  catheter)     Status: None   Collection Time: 07/20/23 11:28 AM   Specimen: Urine, Clean Catch  Result Value Ref Range Status   Specimen Description   Final    URINE, CLEAN CATCH Performed at [facility], [address], [place], [place] [address]    Special Requests   Final    NONE Performed at [facility], [address], [place], [place] [address]    Culture   Final    NO GROWTH Performed at [facility], [address] [place], [place] [address]    Report Status 07/21/2023 FINAL  Final    Coagulation Studies: Recent Labs    07/19/23 1016  LABPROT 16.7*  INR 1.3*    Urinalysis: Recent Labs    07/18/23 1616  COLORURINE YELLOW*  LABSPEC 1.015  PHURINE 5.0  GLUCOSEU NEGATIVE  HGBUR NEGATIVE  BILIRUBINUR NEGATIVE  KETONESUR NEGATIVE  PROTEINUR 100*  NITRITE NEGATIVE  LEUKOCYTESUR NEGATIVE      Imaging: No results found.   Medications:    cefTRIAXone (ROCEPHIN)  IV 2 g (07/21/23 1100)    amLODipine  10 mg Oral Daily   ammonium lactate  1 Application Topical BID   aspirin  81 mg Oral Daily   bisacodyl  10 mg Oral Daily   cloNIDine  0.1 mg Oral BID   clopidogrel  75 mg Oral Daily   enoxaparin (LOVENOX) injection  30 mg Subcutaneous Q24H   feeding supplement (GLUCERNA SHAKE)  237 mL Oral BID BM   insulin aspart  0-5 Units Subcutaneous QHS   insulin aspart  0-9 Units Subcutaneous TID WC   insulin glargine-yfgn  25 Units Subcutaneous Daily   levothyroxine  50 mcg Oral Q0600   metoprolol tartrate  50 mg Oral BID   mometasone-formoterol  2 puff Inhalation BID   omega-3 acid ethyl esters  1 g Oral Daily   pantoprazole  40 mg Oral BID AC   rosuvastatin  40 mg Oral Daily   senna-docusate  1 tablet Oral BID   tamsulosin  0.4 mg Oral QPC supper   acetaminophen, albuterol, ALPRAZolam, alum & mag hydroxide-simeth, colchicine, diphenhydrAMINE, fluticasone, meclizine, nitroGLYCERIN, ondansetron **OR** ondansetron (ZOFRAN)  IV  Assessment/ Plan:  [name] is a 72 y.o.  male with past medical conditions including diabetes, GERD, bronchitis, CAD, BPH, hypertension, thyroid disease, and chronic kidney disease  stage IIIb.  Patient presents to the emergency department complaining of fatigue and fevers.  Patient has been admitted for Dehydration [E86.0] Weakness [R53.1] Sepsis secondary to UTI (HCC) [A41.9, N39.0] Sepsis, due to unspecified organism, unspecified whether acute organ dysfunction present [facility]) [A41.9]   Acute Kidney Injury with hyponatremia on chronic kidney disease stage IIIb with baseline creatinine 1.8-2.0.  Recent renal biopsy completed at [facility] on 9//24, awaiting findings. Chronic kidney disease is secondary to diabetes and hypertension.  CT chest/abdomen/pelvis negative for hydronephrosis.  Acute kidney injury likely has a component of dehydration due to poor oral intake.  Agree with ordered IV fluids.  Creatinine continues to rise.  Patient continues to make urine, will encourage nursing to document.  Sodium 128, likely secondary to kidney injury.  No acute indication for dialysis.  Patient agreeable to the dialysis if needed.  Will continue to monitor for right now.   Lab Results  Component Value Date   CREATININE 3.27 (H) 07/20/2023   CREATININE 2.80 (H) 07/19/2023   CREATININE 2.75 (H) 07/18/2023    Intake/Output Summary (Last 24 hours) at 07/21/2023 1452 Last data filed at 07/20/2023 2106 Gross per 24 hour  Intake 710.61 ml  Output --  Net 710.61 ml    2. Anemia of chronic kidney disease Lab Results  Component Value Date   HGB 8.9 (L) 07/19/2023    Hemoglobin just below desired range of 9-11.  Will monitor for now.  3. Diabetes mellitus type II with chronic kidney disease/renal manifestations: insulin dependent. Home regimen includes aspart and Lantus. Most recent hemoglobin A1c is 7.8 on 07/19/23.   4.  Hypertension with chronic kidney disease.  Home  regimen includes amlodipine, clonidine, and metoprolol.  Also prescribed tamsulosin.  Currently prescribed the same medications during admission.    LOS: 3 Alecxis Baltzell 9/9/20242:52 PM

## 2023-07-21 NOTE — Progress Notes (Signed)
Inpatient Rehab Admissions Coordinator:    I discussed case with rehab MD to discuss pt.'s medical necessity for CIR admit. He states that at this time, Pt. Does not meet criteria for admission with Medicare; however, if he remains inpatient for 3-4 more days and functional deficits do not resolve, Pt. May meet criteria based on debility. I will follow through the week for progress. If Pt. Is stable to d/c sooner than that, may consider SNF. Please reach out to me with questions.  Megan Salon, MS, CCC-SLP Rehab Admissions Coordinator  [phone] (celll) [phone] (office)

## 2023-07-21 NOTE — Care Management Important Message (Signed)
Important Message  Patient Details  Name: Roberto Ingram MRN: [id number] Date of Birth: [birth year]   Medicare Important Message Given:  Yes  Reviewed Medicare IM with patient and wife, obtained initial consent.  Copy of Medicare IM left in room for reference, original to be scanned into the chart.     [name] 07/21/2023, 2:03 PM

## 2023-07-21 NOTE — Progress Notes (Signed)
[facility]  - [place] at [facility]   PATIENT NAME: Roberto Ingram    MR#:  [id number]  DATE OF BIRTH:  06-15-1951  SUBJECTIVE:   Wife at bedside. Continues to spike fever.tmax 102.7 poor appetite and generalized weakness.  VITALS:  Blood pressure 128/74, pulse 73, temperature 98.2 F (36.8 C), resp. rate 18, height 6' (1.829 m), weight 97.5 kg, SpO2 98%.  PHYSICAL EXAMINATION:   GENERAL:  72 y.o.-year-old patient with no acute distress. Weak deconditioned LUNGS: Normal breath sounds bilaterally, no wheezing CARDIOVASCULAR: S1, S2 normal. No murmur   ABDOMEN: Soft, nontender, nondistended. Bowel sounds present.  EXTREMITIES: No  edema b/l.   Left axillary skin biopsy area appears normal. NEUROLOGIC: nonfocal  patient is alert and awake  LABORATORY PANEL:  CBC Recent Labs  Lab 07/19/23 1016  WBC 12.9*  HGB 8.9*  HCT 28.4*  PLT 353    Chemistries  Recent Labs  Lab 07/19/23 1016 07/20/23 1051  NA 128* 128*  K 4.7 4.9  CL 97* 97*  CO2 23 24  GLUCOSE 265* 203*  BUN 30* 28*  CREATININE 2.80* 3.27*  CALCIUM 7.8* 8.1*  AST 17  --   ALT 16  --   ALKPHOS 58  --   BILITOT 0.5  --    Cardiac Enzymes No results for input(s): "TROPONINI" in the last 168 hours. RADIOLOGY:  No results found.  Assessment and Plan  Roberto Ingram is a 72 y.o. male with medical history significant of coronary artery disease, type 2 diabetes, GERD, BPH, asthmatic bronchitis, history of GI bleed, essential hypertension, hypothyroidism, mixed hyperlipidemia, thyroid disease, recent GI tumor status post removal at [facility] who presents to the ER with generalized weakness, sore throat, dry mouth.  Symptoms have been going on for several days now.  Patient also has diffuse bodyaches.  Has had weakness and decreased appetite.  He has not eaten much.   Urinalysis suggestive of possible UTI but patient has been on Septra.  He appears dehydrated with hyponatremia and AKI.  At  this point patient is being admitted to the hospital for management of sepsis due to UTI.  Sepsis present on admission exact etiology unknown -- patient came in with white count of 13.6, fever of 102, tachycardia -- IV Rocephin -- follow-up blood culture negative  --urine culture no growth -- white count trending down -- lactic acid 1.9 -- continue IV fluids -- patient continues to spike fever pure.  -- viral panel-- negative -- discussed with infectious disease Dr. Drue Second to see patient.  Acute on chronic kidney disease stage IIIB hyponatremia -- baseline creatinine most recent labs from [facility] on July 11, 2023 was 1.8 -- came in with creatinine of 2.75-- 2.8-- continue IV fluids-- 3.27 -- this is suspected from poor PO intake since his recent procedure -- nephrology consultation with Dr. 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 Smith Robert. Patient and wife agree with consultation. FNA results from left axillary lymph node biopsy done at [facility] inconclusive per Dr. Smith Robert. -- She recommends exceptional biopsy from left axilla versus supraclavicular area. -- Gen. surgery consultation with Dr. Hazle Quant. Patient will get extensional biopsy done tomorrow.  Hypertension -- continue home meds  Type II diabetes, hyperglycemia, CKD stage IIIB -Will resume long-acting insulin and sliding scale and adjust insulin dosage according to sugars  Anemia of chronic disease -- hemoglobin stable  Hypothyroidism -- on Synthroid  Obstructive sleep apnea -- continue CPAP  Generalized weakness with debility -- PT OT to see patient  History of CAD/CVA -- holding aspirin and Plavix for now since patient wait for biopsy   Procedures: Family communication : wife at bedside Consults : none CODE STATUS: full DVT Prophylaxis :  enoxaparin Level of care: Telemetry Medical Status is: Inpatient Remains inpatient appropriate because: treatment for sepsis, bx of left axilalry LN    TOTAL TIME TAKING CARE OF THIS PATIENT: 35 minutes.  >50% time spent on counselling and coordination of care  Note: This dictation was prepared with Dragon dictation along with smaller phrase technology. Any transcriptional errors that result from this process are unintentional.  Enedina Finner M.D    [facility]   CC: Primary care physician; Erasmo Downer, NP

## 2023-07-22 ENCOUNTER — Inpatient Hospital Stay: Payer: Medicare Other | Admitting: Certified Registered"

## 2023-07-22 ENCOUNTER — Encounter: Payer: Self-pay | Admitting: Internal Medicine

## 2023-07-22 ENCOUNTER — Other Ambulatory Visit: Payer: Self-pay

## 2023-07-22 ENCOUNTER — Encounter
Admission: EM | Disposition: A | Discharge status: Home Health | Payer: Self-pay | Source: Home / Self Care | Attending: Internal Medicine

## 2023-07-22 DIAGNOSIS — G4733 Obstructive sleep apnea (adult) (pediatric): Secondary | ICD-10-CM | POA: Diagnosis not present

## 2023-07-22 DIAGNOSIS — A419 Sepsis, unspecified organism: Secondary | ICD-10-CM | POA: Diagnosis not present

## 2023-07-22 DIAGNOSIS — N179 Acute kidney failure, unspecified: Secondary | ICD-10-CM | POA: Diagnosis not present

## 2023-07-22 DIAGNOSIS — I10 Essential (primary) hypertension: Secondary | ICD-10-CM | POA: Diagnosis not present

## 2023-07-22 DIAGNOSIS — N390 Urinary tract infection, site not specified: Secondary | ICD-10-CM | POA: Diagnosis not present

## 2023-07-22 HISTORY — PX: INGUINAL LYMPH NODE BIOPSY: SHX5865

## 2023-07-22 LAB — GLUCOSE, CAPILLARY
Glucose-Capillary: 72 mg/dL (ref 70–99)
Glucose-Capillary: 69 mg/dL — ABNORMAL LOW (ref 70–99)
Glucose-Capillary: 94 mg/dL (ref 70–99)
Glucose-Capillary: 120 mg/dL — ABNORMAL HIGH (ref 70–99)
Glucose-Capillary: 126 mg/dL — ABNORMAL HIGH (ref 70–99)
Glucose-Capillary: 234 mg/dL — ABNORMAL HIGH (ref 70–99)

## 2023-07-22 LAB — BASIC METABOLIC PANEL
Anion gap: 8 (ref 5–15)
BUN: 22 mg/dL (ref 8–23)
CO2: 24 mmol/L (ref 22–32)
Calcium: 8 mg/dL — ABNORMAL LOW (ref 8.9–10.3)
Chloride: 100 mmol/L (ref 98–111)
Creatinine, Ser: 2.56 mg/dL — ABNORMAL HIGH (ref 0.61–1.24)
GFR, Estimated: 26 mL/min — ABNORMAL LOW (ref >60)
Glucose, Bld: 131 mg/dL — ABNORMAL HIGH (ref 70–99)
Potassium: 4.6 mmol/L (ref 3.5–5.1)
Sodium: 132 mmol/L — ABNORMAL LOW (ref 135–145)

## 2023-07-22 SURGERY — BIOPSY, LYMPH NODE, INGUINAL, OPEN
Anesthesia: General | Site: Axilla | Laterality: Left

## 2023-07-22 MED ORDER — LIDOCAINE HCL (CARDIAC) PF 100 MG/5ML IV SOSY
PREFILLED_SYRINGE | INTRAVENOUS | Status: DC | PRN
  Administered 2023-07-22: 100 mg via INTRAVENOUS

## 2023-07-22 MED ORDER — FENTANYL CITRATE (PF) 100 MCG/2ML IJ SOLN
INTRAMUSCULAR | Status: DC | PRN
  Administered 2023-07-22 (×2): 50 ug via INTRAVENOUS

## 2023-07-22 MED ORDER — PROPOFOL 500 MG/50ML IV EMUL
INTRAVENOUS | Status: DC | PRN
Start: 2023-07-22 — End: 2023-07-22
  Administered 2023-07-22: 150 ug/kg/min via INTRAVENOUS

## 2023-07-22 MED ORDER — BUPIVACAINE-EPINEPHRINE 0.25% -1:200000 IJ SOLN
INTRAMUSCULAR | Status: DC | PRN
  Administered 2023-07-22: 30 mL

## 2023-07-22 MED ORDER — EPHEDRINE SULFATE (PRESSORS) 50 MG/ML IJ SOLN
INTRAMUSCULAR | Status: DC | PRN
  Administered 2023-07-22: 5 mg via INTRAVENOUS

## 2023-07-22 MED ORDER — CEFAZOLIN SODIUM 1 G IJ SOLR
INTRAMUSCULAR | Status: AC
  Filled 2023-07-22: qty 20

## 2023-07-22 MED ORDER — PHENYLEPHRINE 80 MCG/ML (10ML) SYRINGE FOR IV PUSH (FOR BLOOD PRESSURE SUPPORT)
PREFILLED_SYRINGE | INTRAVENOUS | Status: AC
  Filled 2023-07-22: qty 10

## 2023-07-22 MED ORDER — HEMOSTATIC AGENTS (NO CHARGE) OPTIME
TOPICAL | Status: DC | PRN
Start: 2023-07-22 — End: 2023-07-22
  Administered 2023-07-22: 1 via TOPICAL

## 2023-07-22 MED ORDER — LORAZEPAM 2 MG/ML IJ SOLN: 0.5000 mg | Freq: Once | INTRAMUSCULAR | Status: DC

## 2023-07-22 MED ORDER — PROPOFOL 10 MG/ML IV BOLUS
INTRAVENOUS | Status: DC | PRN
  Administered 2023-07-22: 200 mg via INTRAVENOUS

## 2023-07-22 MED ORDER — DEXTROSE 50 % IV SOLN
12.5000 g | INTRAVENOUS | Status: AC
  Administered 2023-07-22: 12.5 g via INTRAVENOUS
  Filled 2023-07-22: qty 50

## 2023-07-22 MED ORDER — PROPOFOL 1000 MG/100ML IV EMUL
INTRAVENOUS | Status: AC
  Filled 2023-07-22: qty 100

## 2023-07-22 MED ORDER — ALPRAZOLAM 0.5 MG PO TABS
0.5000 mg | ORAL_TABLET | Freq: Every day | ORAL | Status: DC | PRN
  Administered 2023-07-22 – 2023-07-23 (×2): 0.5 mg via ORAL
  Filled 2023-07-22 (×2): qty 1

## 2023-07-22 MED ORDER — ACETAMINOPHEN 10 MG/ML IV SOLN
INTRAVENOUS | Status: AC
  Filled 2023-07-22: qty 100

## 2023-07-22 MED ORDER — ACETAMINOPHEN 10 MG/ML IV SOLN
INTRAVENOUS | Status: DC | PRN
  Administered 2023-07-22: 1000 mg via INTRAVENOUS

## 2023-07-22 MED ORDER — BUPIVACAINE-EPINEPHRINE (PF) 0.25% -1:200000 IJ SOLN
INTRAMUSCULAR | Status: AC
  Filled 2023-07-22: qty 30

## 2023-07-22 MED ORDER — EPHEDRINE 5 MG/ML INJ
INTRAVENOUS | Status: AC
  Filled 2023-07-22: qty 5

## 2023-07-22 MED ORDER — ONDANSETRON HCL 4 MG/2ML IJ SOLN
INTRAMUSCULAR | Status: DC | PRN
  Administered 2023-07-22: 4 mg via INTRAVENOUS

## 2023-07-22 MED ORDER — SODIUM CHLORIDE 0.9 % IV SOLN
INTRAVENOUS | Status: DC | PRN
Start: 2023-07-22 — End: 2023-07-22

## 2023-07-22 MED ORDER — PHENYLEPHRINE HCL-NACL 20-0.9 MG/250ML-% IV SOLN
INTRAVENOUS | Status: DC | PRN
Start: 2023-07-22 — End: 2023-07-22
  Administered 2023-07-22: 40 ug/min via INTRAVENOUS

## 2023-07-22 MED ORDER — FENTANYL CITRATE (PF) 100 MCG/2ML IJ SOLN
INTRAMUSCULAR | Status: AC
  Filled 2023-07-22: qty 2

## 2023-07-22 MED ORDER — FENTANYL CITRATE (PF) 100 MCG/2ML IJ SOLN: 25.0000 ug | INTRAMUSCULAR | Status: DC | PRN

## 2023-07-22 MED ORDER — CEFAZOLIN SODIUM-DEXTROSE 1-4 GM/50ML-% IV SOLN
INTRAVENOUS | Status: DC | PRN
  Administered 2023-07-22: 2 g via INTRAVENOUS

## 2023-07-22 MED ORDER — 0.9 % SODIUM CHLORIDE (POUR BTL) OPTIME: TOPICAL | Status: DC | PRN

## 2023-07-22 MED ORDER — STERILE WATER FOR IRRIGATION IR SOLN
Status: DC | PRN
Start: 2023-07-22 — End: 2023-07-22
  Administered 2023-07-22: 500 mL

## 2023-07-22 MED ORDER — PHENYLEPHRINE 80 MCG/ML (10ML) SYRINGE FOR IV PUSH (FOR BLOOD PRESSURE SUPPORT)
PREFILLED_SYRINGE | INTRAVENOUS | Status: DC | PRN
  Administered 2023-07-22: 160 ug via INTRAVENOUS
  Administered 2023-07-22 (×2): 80 ug via INTRAVENOUS
  Administered 2023-07-22: 160 ug via INTRAVENOUS

## 2023-07-22 MED ORDER — ONDANSETRON HCL 4 MG/2ML IJ SOLN: 4.0000 mg | Freq: Once | INTRAMUSCULAR | Status: DC | PRN

## 2023-07-22 MED ORDER — PROPOFOL 10 MG/ML IV BOLUS
INTRAVENOUS | Status: AC
  Filled 2023-07-22: qty 20

## 2023-07-22 MED ORDER — SODIUM CHLORIDE 0.9 % IV SOLN: INTRAVENOUS | Status: DC | PRN

## 2023-07-22 MED ORDER — DEXAMETHASONE SODIUM PHOSPHATE 10 MG/ML IJ SOLN
INTRAMUSCULAR | Status: DC | PRN
  Administered 2023-07-22: 5 mg via INTRAVENOUS

## 2023-07-22 MED ORDER — MIDAZOLAM HCL 2 MG/2ML IJ SOLN
INTRAMUSCULAR | Status: AC
  Filled 2023-07-22: qty 2

## 2023-07-22 SURGICAL SUPPLY — 34 items
ADH SKN CLS APL DERMABOND .7 (GAUZE/BANDAGES/DRESSINGS) ×1
APL PRP STRL LF DISP 70% ISPRP (MISCELLANEOUS) ×2
BLADE CLIPPER SURG (BLADE) IMPLANT
BLADE SURG 15 STRL LF DISP TIS (BLADE) ×1 IMPLANT
BLADE SURG 15 STRL SS (BLADE) ×1
CHLORAPREP W/TINT 26 (MISCELLANEOUS) ×1 IMPLANT
CNTNR URN SCR LID CUP LEK RST (MISCELLANEOUS) IMPLANT
CONT SPEC 4OZ STRL OR WHT (MISCELLANEOUS) ×1
DERMABOND ADVANCED .7 DNX12 (GAUZE/BANDAGES/DRESSINGS) ×1 IMPLANT
DRAPE LAPAROTOMY TRNSV 106X77 (MISCELLANEOUS) ×1 IMPLANT
ELECT CAUTERY BLADE 6.4 (BLADE) ×1 IMPLANT
ELECT REM PT RETURN 9FT ADLT (ELECTROSURGICAL) ×1
ELECTRODE REM PT RTRN 9FT ADLT (ELECTROSURGICAL) ×1 IMPLANT
GAUZE 4X4 16PLY ~~LOC~~+RFID DBL (SPONGE) ×1 IMPLANT
GLOVE BIO SURGEON STRL SZ 6.5 (GLOVE) ×1 IMPLANT
GLOVE BIOGEL PI IND STRL 6.5 (GLOVE) ×1 IMPLANT
GOWN STRL REUS W/ TWL LRG LVL3 (GOWN DISPOSABLE) ×2 IMPLANT
GOWN STRL REUS W/TWL LRG LVL3 (GOWN DISPOSABLE) ×2
HEMOSTAT ARISTA ABSORB 3G PWDR (HEMOSTASIS) IMPLANT
KIT TURNOVER KIT A (KITS) ×1 IMPLANT
LABEL OR SOLS (LABEL) ×1 IMPLANT
MANIFOLD NEPTUNE II (INSTRUMENTS) ×1 IMPLANT
NDL HYPO 22X1.5 SAFETY MO (MISCELLANEOUS) ×1 IMPLANT
NEEDLE HYPO 22X1.5 SAFETY MO (MISCELLANEOUS) ×1 IMPLANT
NS IRRIG 500ML POUR BTL (IV SOLUTION) ×1 IMPLANT
PACK BASIN MINOR ARMC (MISCELLANEOUS) ×1 IMPLANT
SUT MNCRL 4-0 (SUTURE)
SUT MNCRL 4-0 27XMFL (SUTURE)
SUT VIC AB 3-0 SH 27 (SUTURE)
SUT VIC AB 3-0 SH 27X BRD (SUTURE) ×1 IMPLANT
SUTURE MNCRL 4-0 27XMF (SUTURE) ×1 IMPLANT
SYR 10ML LL (SYRINGE) ×1 IMPLANT
TRAP FLUID SMOKE EVACUATOR (MISCELLANEOUS) ×1 IMPLANT
WATER STERILE IRR 500ML POUR (IV SOLUTION) ×1 IMPLANT

## 2023-07-22 NOTE — Op Note (Signed)
Preoperative diagnosis: Left axillary adenopathy.  Postoperative diagnosis: Same.   Procedure: Left Axillary Lymph node excisional biopsy  Anesthesia: GETA  Surgeon: Dr. Hazle Quant  Wound Classification: Clean  Indications: Patient is a 72 y.o. male with palpable left axillary adenopathy with fever, chills, history of GIST. Biopsy and culture indicated to rule out malignancy and/or infection  Findings: 1. Large palpable left axillary lymph nodes.   Description of procedure: The patient was taken to the operating room and placed supine on the operating table, and after general anesthesia the left axilla was prepped and draped in the usual sterile fashion. A time-out was completed verifying correct patient, procedure, site, positioning, and implant(s) and/or special equipment prior to beginning this procedure.   An incision was made around the caudal axillary hairline. Dissection was carried down until subdermal facias was advanced. Dissection continue until nodule was identified. Two nodules were removed, one for tissue biopsy and one for tissue culture.  The nodes were excised in its entirety.  The procedure was terminated. Hemostasis was achieved and the wound closed in layers with deep interrupted 3-0 Vicryl and skin was closed with subcuticular suture of Monocryl 4-0.  The patient tolerated the procedure well and was taken to the postanesthesia care unit in stable condition.   Specimen: Left axillary Lymph nodes  Complications: None  Estimated Blood Loss: 25 mL

## 2023-07-22 NOTE — Progress Notes (Signed)
[facility]  - [place] at [facility]   PATIENT NAME: Roberto Ingram    MR#:  [id number]  DATE OF BIRTH:  01/28/1951  SUBJECTIVE:   Wife at bedside.no fever today poor appetite and generalized weakness. NPO for LN biopsy  VITALS:  Blood pressure (!) 141/63, pulse 78, temperature 98.5 F (36.9 C), temperature source Temporal, resp. rate 16, height 6' (1.829 m), weight 99.8 kg, SpO2 94%.  PHYSICAL EXAMINATION:   GENERAL:  72 y.o.-year-old patient with no acute distress. Weak deconditioned LUNGS: Normal breath sounds bilaterally, no wheezing CARDIOVASCULAR: S1, S2 normal. No murmur   ABDOMEN: Soft, nontender, nondistended. Bowel sounds present.  EXTREMITIES: No  edema b/l.   Left axillary skin biopsy area appears normal. NEUROLOGIC: nonfocal  patient is alert and awake  LABORATORY PANEL:  CBC Recent Labs  Lab 07/19/23 1016  WBC 12.9*  HGB 8.9*  HCT 28.4*  PLT 353    Chemistries  Recent Labs  Lab 07/19/23 1016 07/20/23 1051  NA 128* 128*  K 4.7 4.9  CL 97* 97*  CO2 23 24  GLUCOSE 265* 203*  BUN 30* 28*  CREATININE 2.80* 3.27*  CALCIUM 7.8* 8.1*  AST 17  --   ALT 16  --   ALKPHOS 58  --   BILITOT 0.5  --    Cardiac Enzymes No results for input(s): "TROPONINI" in the last 168 hours. RADIOLOGY:  No results found.  Assessment and Plan  Roberto Ingram is a 72 y.o. male with medical history significant of coronary artery disease, type 2 diabetes, GERD, BPH, asthmatic bronchitis, history of GI bleed, essential hypertension, hypothyroidism, mixed hyperlipidemia, thyroid disease, recent GI tumor status post removal at [facility] who presents to the ER with generalized weakness, sore throat, dry mouth.  Symptoms have been going on for several days now.  Patient also has diffuse bodyaches.  Has had weakness and decreased appetite.  He has not eaten much.   Urinalysis suggestive of possible UTI but patient has been on Septra.  He appears dehydrated  with hyponatremia and AKI.  At this point patient is being admitted to the hospital for management of sepsis due to UTI.  Sepsis present on admission exact etiology unknown -- patient came in with white count of 13.6, fever of 102, tachycardia -- IV Rocephin -- follow-up blood culture negative  --urine culture no growth -- white count trending down -- lactic acid 1.9 -- continue IV fluids -- patient continues to spike fever pure.  -- viral panel-- negative -- discussed with infectious disease Dr. Drue Second-- recommends continue antibiotics till we find results of liver biopsy  Acute on chronic kidney disease stage IIIB hyponatremia -- baseline creatinine most recent labs from [facility] on July 11, 2023 was 1.8 -- came in with creatinine of 2.75-- 2.8-- continue IV fluids-- 3.27 -- this is suspected from poor PO intake since his recent procedure -- nephrology consultation with Dr. 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 Smith Robert. Patient and wife agree with consultation. FNA results from left axillary lymph node biopsy done at [facility] inconclusive per Dr. Smith Robert. -- She recommends exceptional biopsy from left axilla versus supraclavicular area. -- Gen. surgery consultation with Dr. Hazle Quant. Patient will get extensional biopsy done to today  Hypertension -- continue home meds  Type II diabetes, hyperglycemia,  CKD stage IIIB -Will resume long-acting insulin and sliding scale and adjust insulin dosage according to sugars  Anemia of chronic disease -- hemoglobin stable  Hypothyroidism -- on Synthroid  Obstructive sleep apnea -- continue CPAP  History of CAD -- aspirin Plavix on hold due to biopsy-- discuss with general surgery and resume when appropriate  Generalized weakness with  debility -- PT OT to see patient  History of CAD/CVA -- holding aspirin and Plavix for now since patient wait for biopsy   Procedures: Family communication : wife at bedside Consults : none CODE STATUS: full DVT Prophylaxis : enoxaparin Level of care: Telemetry Medical Status is: Inpatient Remains inpatient appropriate because: treatment for sepsis, bx of left axilalry LN    TOTAL TIME TAKING CARE OF THIS PATIENT: 35 minutes.  >50% time spent on counselling and coordination of care  Note: This dictation was prepared with Dragon dictation along with smaller phrase technology. Any transcriptional errors that result from this process are unintentional.  Enedina Finner M.D    [facility]   CC: Primary care physician; Erasmo Downer, NP

## 2023-07-22 NOTE — Progress Notes (Signed)
Patient ID: Roberto Ingram, male   DOB: [birth year], 72 y.o.   MRN: [id number]     SURGICAL PROGRESS NOTE   Hospital Day(s): 4.   Interval History: Patient seen and examined, no acute events or new complaints overnight. Patient reports feeling fair, still anxious about the surgery today.  Otherwise he denies any fever this morning.  He had a fever yesterday over 102.7.  Endorses having intermittent chills.  Vital signs in last 24 hours: [min-max] current  Temp:  [98.2 F (36.8 C)-102.7 F (39.3 C)] 99.6 F (37.6 C) (09/10 0412) Pulse Rate:  [73-89] 76 (09/10 0412) Resp:  [18-20] 20 (09/10 0412) BP: (128-159)/(57-74) 148/63 (09/10 0412) SpO2:  [92 %-100 %] 96 % (09/10 0412) FiO2 (%):  [21 %] 21 % (09/10 0330) Weight:  [99.8 kg] 99.8 kg (09/10 0500)     Height: 6' (182.9 cm) Weight: 99.8 kg BMI (Calculated): 29.83   Physical Exam:  Constitutional: alert, cooperative and no distress  Respiratory: breathing non-labored at rest  Cardiovascular: regular rate and sinus rhythm  Gastrointestinal: soft, non-tender, and non-distended Extremity: Abundant lymphadenopathy on the left axillary area.  Labs:     Latest Ref Rng & Units 07/19/2023   10:16 AM 07/18/2023    1:43 PM 06/22/2023    8:41 PM  CBC  WBC 4.0 - 10.5 K/uL 12.9  13.6  16.8   Hemoglobin 13.0 - 17.0 g/dL 8.9  9.7  9.6   Hematocrit 39.0 - 52.0 % 28.4  31.2  30.6   Platelets 150 - 400 K/uL 353  345  521       Latest Ref Rng & Units 07/20/2023   10:51 AM 07/19/2023   10:16 AM 07/18/2023    4:16 PM  CMP  Glucose 70 - 99 mg/dL 657  846    BUN 8 - 23 mg/dL 28  30    Creatinine 9.62 - 1.24 mg/dL 9.52  8.41    Sodium 324 - 145 mmol/L 128  128    Potassium 3.5 - 5.1 mmol/L 4.9  4.7    Chloride 98 - 111 mmol/L 97  97    CO2 22 - 32 mmol/L 24  23    Calcium 8.9 - 10.3 mg/dL 8.1  7.8    Total Protein 6.5 - 8.1 g/dL  6.8  7.3   Total Bilirubin 0.3 - 1.2 mg/dL  0.5  0.6   Alkaline Phos 38 - 126 U/L  58  60   AST 15 - 41 U/L  17  18    ALT 0 - 44 U/L  16  16     Imaging studies: No new pertinent imaging studies   Assessment/Plan:  72 y.o. male with lymphadenopathy, complicated by pertinent comorbidities including history of GIST, coronary artery disease, type 2 diabetes mellitus.   -Patient again oriented about the procedure today of excisional biopsy of a left axillary lymph node -Discussed with patient benefits and risk of surgery including bleeding, infection, pain, among others -Indication for procedure is to rule out metastatic disease versus malignancy versus infection -Patient and his wife present endorses they agreed to proceed with left axillary excisional biopsy today.  Gae Gallop, MD

## 2023-07-22 NOTE — Progress Notes (Addendum)
[facility]  ROUNDING NOTE   Subjective:   Roberto Ingram is a 72 year old male with past medical conditions including diabetes, GERD, bronchitis, CAD, BPH, hypertension, thyroid disease, and chronic kidney disease stage IIIb.  Patient presents to the emergency department complaining of fatigue and fevers.  Patient has been admitted for Dehydration [E86.0] Weakness [R53.1] Sepsis secondary to UTI (HCC) [A41.9, N39.0] Sepsis, due to unspecified organism, unspecified whether acute organ dysfunction present [facility]) [A41.9]  Patient seen sitting up in bed Wife at bedside Currently n.p.o. for scheduled biopsy  No a.m. labs   Objective:  Vital signs in last 24 hours:  Temp:  [98.2 F (36.8 C)-99.9 F (37.7 C)] 98.5 F (36.9 C) (09/10 1253) Pulse Rate:  [73-80] 78 (09/10 1253) Resp:  [16-20] 16 (09/10 1253) BP: (128-159)/(63-74) 141/63 (09/10 1253) SpO2:  [94 %-100 %] 94 % (09/10 1253) FiO2 (%):  [21 %] 21 % (09/10 0330) Weight:  [99.8 kg] 99.8 kg (09/10 0500)  Weight change:  Filed Weights   07/18/23 1341 07/18/23 2028 07/22/23 0500  Weight: 97.1 kg 97.5 kg 99.8 kg    Intake/Output: I/O last 3 completed shifts: In: 60 [P.O.:60] Out: 1450 [Urine:1450]   Intake/Output this shift:  Total I/O In: 100 [IV Piggyback:100] Out: 900 [Urine:900]  Physical Exam: General: NAD  Head: Normocephalic, atraumatic.  Dry oral mucosal membranes  Eyes: Anicteric,  Lungs:  Clear to auscultation, normal effort  Heart: Regular rate and rhythm  Abdomen:  Soft, nontender,   Extremities:  2+ peripheral edema.  Neurologic: Nonfocal, moving all four extremities  Skin: No lesions  Access: None    Basic Metabolic Panel: Recent Labs  Lab 07/18/23 1343 07/19/23 1016 07/20/23 1051 07/21/23 1011  NA 129* 128* 128*  --   K 4.7 4.7 4.9  --   CL 96* 97* 97*  --   CO2 25 23 24   --   GLUCOSE 257* 265* 203*  --   BUN 34* 30* 28*  --   CREATININE 2.75* 2.80* 3.27*  --   CALCIUM  8.4* 7.8* 8.1*  --   PHOS  --   --   --  2.1*    Liver Function Tests: Recent Labs  Lab 07/18/23 1616 07/19/23 1016  AST 18 17  ALT 16 16  ALKPHOS 60 58  BILITOT 0.6 0.5  PROT 7.3 6.8  ALBUMIN 2.6* 2.4*   Recent Labs  Lab 07/18/23 1616  LIPASE 42   No results for input(s): "AMMONIA" in the last 168 hours.  CBC: Recent Labs  Lab 07/18/23 1343 07/19/23 1016  WBC 13.6* 12.9*  HGB 9.7* 8.9*  HCT 31.2* 28.4*  MCV 85.5 83.8  PLT 345 353    Cardiac Enzymes: No results for input(s): "CKTOTAL", "CKMB", "CKMBINDEX", "TROPONINI" in the last 168 hours.  BNP: Invalid input(s): "POCBNP"  CBG: Recent Labs  Lab 07/21/23 1655 07/21/23 2122 07/22/23 0907 07/22/23 1205 07/22/23 1257  GLUCAP 84 97 72 69* 94    Microbiology: Results for orders placed or performed during the hospital encounter of 07/18/23  Blood culture (routine x 2)     Status: None (Preliminary result)   Collection Time: 07/18/23  1:46 PM   Specimen: BLOOD  Result Value Ref Range Status   Specimen Description BLOOD BLOOD LEFT ARM  Final   Special Requests   Final    BOTTLES DRAWN AEROBIC AND ANAEROBIC Blood Culture adequate volume   Culture   Final    NO GROWTH 4 DAYS Performed  at [facility], [address] [place], [place] [address]    Report Status PENDING  Incomplete  SARS Coronavirus 2 by RT PCR (hospital order, performed in [facility] hospital lab) *cepheid single result test* Anterior Nasal Swab     Status: None   Collection Time: 07/18/23  1:47 PM   Specimen: Anterior Nasal Swab  Result Value Ref Range Status   SARS Coronavirus 2 by RT PCR NEGATIVE NEGATIVE Final    Comment: (NOTE) SARS-CoV-2 target nucleic acids are NOT DETECTED.  The SARS-CoV-2 RNA is generally detectable in upper and lower respiratory specimens during the acute phase of infection. The lowest concentration of SARS-CoV-2 viral copies this assay can detect is 250 copies / mL. A negative result does not  preclude SARS-CoV-2 infection and should not be used as the sole basis for treatment or other patient management decisions.  A negative result may occur with improper specimen collection / handling, submission of specimen other than nasopharyngeal swab, presence of viral mutation(s) within the areas targeted by this assay, and inadequate number of viral copies (<250 copies / mL). A negative result must be combined with clinical observations, patient history, and epidemiological information.  Fact Sheet for Patients:   RoadLapTop.co.za  Fact Sheet for Healthcare Providers: http://kim-miller.com/  This test is not yet approved or  cleared by the [place] FDA and has been authorized for detection and/or diagnosis of SARS-CoV-2 by FDA under an Emergency Use Authorization (EUA).  This EUA will remain in effect (meaning this test can be used) for the duration of the COVID-19 declaration under Section 564(b)(1) of the Act, 21 U.S.C. section 360bbb-3(b)(1), unless the authorization is terminated or revoked sooner.  Performed at [facility], [address] [place], [place] [address]   Group A Strep by PCR [facility] Only)     Status: None   Collection Time: 07/18/23  4:16 PM   Specimen: Throat; Sterile Swab  Result Value Ref Range Status   Group A Strep by PCR NOT DETECTED NOT DETECTED Final    Comment: Performed at [facility], [address] [place], [place] [address]  Culture, blood (Routine X 2) w Reflex to ID Panel     Status: None (Preliminary result)   Collection Time: 07/19/23  1:09 PM   Specimen: BLOOD  Result Value Ref Range Status   Specimen Description BLOOD BLOOD RIGHT HAND  Final   Special Requests   Final    BOTTLES DRAWN AEROBIC AND ANAEROBIC Blood Culture results may not be optimal due to an excessive volume of blood received in culture bottles   Culture   Final    NO GROWTH 3 DAYS Performed at  [facility], [address] [place], [place] [address]    Report Status PENDING  Incomplete  Respiratory (~20 pathogens) panel by PCR     Status: None   Collection Time: 07/20/23 10:30 AM   Specimen: Nasopharyngeal Swab; Respiratory  Result Value Ref Range Status   Adenovirus NOT DETECTED NOT DETECTED Final   Coronavirus 229E NOT DETECTED NOT DETECTED Final    Comment: (NOTE) The Coronavirus on the Respiratory Panel, DOES NOT test for the novel  Coronavirus (2019 nCoV)    Coronavirus HKU1 NOT DETECTED NOT DETECTED Final   Coronavirus NL63 NOT DETECTED NOT DETECTED Final   Coronavirus OC43 NOT DETECTED NOT DETECTED Final   Metapneumovirus NOT DETECTED NOT DETECTED Final   Rhinovirus / Enterovirus NOT DETECTED NOT DETECTED Final   Influenza A  NOT DETECTED NOT DETECTED Final   Influenza B NOT DETECTED NOT DETECTED Final   Parainfluenza Virus 1 NOT DETECTED NOT DETECTED Final   Parainfluenza Virus 2 NOT DETECTED NOT DETECTED Final   Parainfluenza Virus 3 NOT DETECTED NOT DETECTED Final   Parainfluenza Virus 4 NOT DETECTED NOT DETECTED Final   Respiratory Syncytial Virus NOT DETECTED NOT DETECTED Final   Bordetella pertussis NOT DETECTED NOT DETECTED Final   Bordetella Parapertussis NOT DETECTED NOT DETECTED Final   Chlamydophila pneumoniae NOT DETECTED NOT DETECTED Final   Mycoplasma pneumoniae NOT DETECTED NOT DETECTED Final    Comment: Performed at [facility], [address] [place], [place] [address]  Urine Culture (for pregnant, neutropenic or urologic patients or patients with an indwelling urinary catheter)     Status: None   Collection Time: 07/20/23 11:28 AM   Specimen: Urine, Clean Catch  Result Value Ref Range Status   Specimen Description   Final    URINE, CLEAN CATCH Performed at [facility], [address] [place], [place] [address]    Special Requests   Final    NONE Performed at [facility], [address],  [place], [place] [address]    Culture   Final    NO GROWTH Performed at [facility], [address], [place], [place] [address]    Report Status 07/21/2023 FINAL  Final    Coagulation Studies: No results for input(s): "LABPROT", "INR" in the last 72 hours.   Urinalysis: No results for input(s): "COLORURINE", "LABSPEC", "PHURINE", "GLUCOSEU", "HGBUR", "BILIRUBINUR", "KETONESUR", "PROTEINUR", "UROBILINOGEN", "NITRITE", "LEUKOCYTESUR" in the last 72 hours.  Invalid input(s): "APPERANCEUR"     Imaging: No results found.   Medications:    [MAR Hold] sodium chloride 10 mL/hr at 07/22/23 1020   [MAR Hold] cefTRIAXone (ROCEPHIN)  IV 2 g (07/22/23 1022)    [MAR Hold] amLODipine  10 mg Oral Daily   [MAR Hold] ammonium lactate  1 Application Topical BID   [MAR Hold] bisacodyl  10 mg Oral Daily   [MAR Hold] cloNIDine  0.1 mg Oral BID   [MAR Hold] enoxaparin (LOVENOX) injection  30 mg Subcutaneous Q24H   [MAR Hold] feeding supplement (GLUCERNA SHAKE)  237 mL Oral TID BM   [MAR Hold] insulin aspart  0-5 Units Subcutaneous QHS   [MAR Hold] insulin aspart  0-9 Units Subcutaneous TID WC   [MAR Hold] insulin glargine-yfgn  25 Units Subcutaneous Daily   [MAR Hold] levothyroxine  50 mcg Oral Q0600   [MAR Hold] LORazepam  0.5 mg Intravenous Once   [MAR Hold] metoprolol tartrate  50 mg Oral BID   [MAR Hold] mometasone-formoterol  2 puff Inhalation BID   [MAR Hold] multivitamin with minerals  1 tablet Oral Daily   [MAR Hold] omega-3 acid ethyl esters  1 g Oral Daily   [MAR Hold] pantoprazole  40 mg Oral BID AC   [MAR Hold] rosuvastatin  40 mg Oral Daily   [MAR Hold] senna-docusate  1 tablet Oral BID   [MAR Hold] tamsulosin  0.4 mg Oral QPC supper   [MAR Hold] sodium chloride, 0.9 % irrigation (POUR BTL), [MAR Hold] acetaminophen, [MAR Hold] albuterol, [MAR Hold] ALPRAZolam, [MAR Hold] alum & mag hydroxide-simeth, [MAR Hold] colchicine, [MAR Hold] diphenhydrAMINE, [MAR Hold] fluticasone,  hemostatic agents (no charge) Optime, [MAR Hold] meclizine, [MAR Hold] melatonin, [MAR Hold] nitroGLYCERIN, [MAR Hold] ondansetron **OR** [MAR Hold] ondansetron (ZOFRAN) IV, sterile water for irrigation, [MAR Hold] traZODone  Assessment/ Plan:  Roberto Ingram is a 72 y.o.  male with past medical conditions including diabetes, GERD, bronchitis, CAD, BPH, hypertension, thyroid disease, and chronic kidney disease stage IIIb.  Patient presents to the emergency department complaining of fatigue and fevers.  Patient has been admitted for Dehydration [E86.0] Weakness [R53.1] Sepsis secondary to UTI (HCC) [A41.9, N39.0] Sepsis, due to unspecified organism, unspecified whether acute organ dysfunction present [facility]) [A41.9]   Acute Kidney Injury with hyponatremia on chronic kidney disease stage IIIb with baseline creatinine 1.8-2.0.  Recent lymph node biopsy completed at [facility] on 9//24, awaiting findings. Chronic kidney disease is secondary to diabetes and hypertension.  CT chest/abdomen/pelvis negative for hydronephrosis.  Acute kidney injury likely has a component of dehydration due to poor oral intake.    Awaiting a.m. labs.  Adequate urine output noted at 1.45 L in preceding 24 hours.  Remains on IV fluids. Due to inconclusive biopsy at [facility], oncology planning to repeat lymph node biopsy today.  Continue to avoid nephrotoxic agents and therapies.  No acute indication for dialysis but monitoring closely.  Lab Results  Component Value Date   CREATININE 3.27 (H) 07/20/2023   CREATININE 2.80 (H) 07/19/2023   CREATININE 2.75 (H) 07/18/2023    Intake/Output Summary (Last 24 hours) at 07/22/2023 1415 Last data filed at 07/22/2023 1408 Gross per 24 hour  Intake 100 ml  Output 2350 ml  Net -2250 ml    2. Anemia of chronic kidney disease Lab Results  Component Value Date   HGB 8.9 (L) 07/19/2023    Will reassess with a.m. labs.  3. Diabetes mellitus type II with chronic kidney  disease/renal manifestations: insulin dependent. Home regimen includes aspart and Lantus. Most recent hemoglobin A1c is 7.8 on 07/19/23.   Hypoglycemic this afternoon to 69, corrected to 94.  Sliding scale managed by primary team.  4.  Hypertension with chronic kidney disease.  Home regimen includes amlodipine, clonidine, and metoprolol.  Also prescribed tamsulosin.  Currently prescribed the same medications during admission.  Blood pressure acceptable, 135/67.   LOS: 4 Darlisa Spruiell 9/10/20242:15 PM

## 2023-07-22 NOTE — Transfer of Care (Signed)
Immediate Anesthesia Transfer of Care Note  Patient: Roberto Ingram  Procedure(s) Performed: LEFT AXILLARY LYMPH NODE BIOPSY (Left: Axilla)  Patient Location: PACU  Anesthesia Type:General  Level of Consciousness: drowsy and patient cooperative  Airway & Oxygen Therapy: Patient Spontanous Breathing and Patient connected to face mask oxygen  Post-op Assessment: Report given to RN and Post -op Vital signs reviewed and stable  Post vital signs: Reviewed and stable  Last Vitals:  Vitals Value Taken Time  BP 111/57 07/22/23 1445  Temp    Pulse 105 07/22/23 1447  Resp 21 07/22/23 1447  SpO2 100 % 07/22/23 1447  Vitals shown include unfiled device data.  Last Pain:  Vitals:   07/22/23 1253  TempSrc: Temporal  PainSc: 0-No pain      Patients Stated Pain Goal: 0 (07/18/23 2100)  Complications: There were no known notable events for this encounter.

## 2023-07-22 NOTE — Anesthesia Procedure Notes (Addendum)
Procedure Name: LMA Insertion Date/Time: 07/22/2023 1:27 PM  Performed by: Lysbeth Penner, CRNAPre-anesthesia Checklist: Patient identified, Emergency Drugs available, Suction available and Patient being monitored Patient Re-evaluated:Patient Re-evaluated prior to induction Oxygen Delivery Method: Circle system utilized Preoxygenation: Pre-oxygenation with 100% oxygen Induction Type: IV induction Ventilation: Mask ventilation without difficulty LMA: LMA inserted LMA Size: 5.0 Number of attempts: 2 Placement Confirmation: ETT inserted through vocal cords under direct vision, positive ETCO2, breath sounds checked- equal and bilateral and CO2 detector Tube secured with: Tape Dental Injury: Teeth and Oropharynx as per pre-operative assessment  Comments: First attempt with size 4 LMA, noticeable leak detected. LMA exchanged with size 5, no further issues. Atraumatic LMA placement.

## 2023-07-22 NOTE — Progress Notes (Signed)
[facility]    Date of Admission:  07/18/2023   Total days of antibiotics 5   ID: Roberto Ingram is a 72 y.o. male with   Principal Problem:   Sepsis secondary to UTI [facility]) Active Problems:   Type 2 diabetes mellitus with hyperlipidemia (HCC)   Essential hypertension   Hypothyroidism (acquired)   Hyperlipemia   OSA (obstructive sleep apnea)   Obstructive sleep apnea on CPAP   AKI (acute kidney injury) (HCC)   Protein-calorie malnutrition, severe    Subjective: Dry throat, tongue. Afebrile this morning  Medications:   amLODipine  10 mg Oral Daily   ammonium lactate  1 Application Topical BID   bisacodyl  10 mg Oral Daily   cloNIDine  0.1 mg Oral BID   enoxaparin (LOVENOX) injection  30 mg Subcutaneous Q24H   feeding supplement (GLUCERNA SHAKE)  237 mL Oral TID BM   insulin aspart  0-5 Units Subcutaneous QHS   insulin aspart  0-9 Units Subcutaneous TID WC   insulin glargine-yfgn  25 Units Subcutaneous Daily   levothyroxine  50 mcg Oral Q0600   LORazepam  0.5 mg Intravenous Once   metoprolol tartrate  50 mg Oral BID   mometasone-formoterol  2 puff Inhalation BID   multivitamin with minerals  1 tablet Oral Daily   omega-3 acid ethyl esters  1 g Oral Daily   pantoprazole  40 mg Oral BID AC   rosuvastatin  40 mg Oral Daily   senna-docusate  1 tablet Oral BID   tamsulosin  0.4 mg Oral QPC supper    Objective: Vital signs in last 24 hours: Temp:  [98.2 F (36.8 C)-99.9 F (37.7 C)] 99.9 F (37.7 C) (09/10 0902) Pulse Rate:  [73-80] 80 (09/10 0902) Resp:  [18-20] 18 (09/10 0902) BP: (128-159)/(63-74) 135/67 (09/10 0902) SpO2:  [96 %-100 %] 96 % (09/10 0902) FiO2 (%):  [21 %] 21 % (09/10 0330) Weight:  [99.8 kg] 99.8 kg (09/10 0500)  Physical Exam  Constitutional: He is oriented to person, place, and time. He appears well-developed and well-nourished. No distress.  HENT:  Mouth/Throat: Oropharynx is clear and moist. No oropharyngeal  exudate.  Cardiovascular: Normal rate, regular rhythm and normal heart sounds. Exam reveals no gallop and no friction rub.  No murmur heard.  Pulmonary/Chest: Effort normal and breath sounds normal. No respiratory distress. He has no wheezes.  Abdominal: Soft. Bowel sounds are normal. He exhibits no distension. There is no tenderness.  Lymphadenopathy:  +supra clavicular and axillary adenopathy.  Neurological: He is alert and oriented to person, place, and time.  Skin: Skin is warm and dry. No rash noted. No erythema.  Psychiatric: He has a normal mood and affect. His behavior is normal.    Lab Results Recent Labs    07/20/23 1051  NA 128*  K 4.9  CL 97*  CO2 24  BUN 28*  CREATININE 3.27*   No results found for: "ESRSEDRATE", "POCTSEDRATE"  Microbiology: reviewed Studies/Results: No results found.   Assessment/Plan: FUO = spoken to dr Hazle Quant and dr patel to also get tissue for aerobic, fungal and afb culture inaddition to path specimen to evaluate for malignancy  Finish out 5 day course of abtx today and observe off of abtx  Will check sed rate and crp  Discussed plan with his wife  I have personally spent 30 minutes involved in face-to-face and non-face-to-face activities for this patient on the day of the visit. Professional time  spent includes the following activities: Preparing to see the patient (review of tests), Obtaining and/or reviewing separately obtained history (admission/discharge record), Performing a medically appropriate examination and/or evaluation , Ordering medications/tests/procedures, referring and communicating with other health care professionals, Documenting clinical information in the EMR, Independently interpreting results (not separately reported), Communicating results to the patient/family/caregiver, Counseling and educating the patient/family/caregiver and Care coordination (not separately reported).     [facility]  [facility] Pager: [phone]  07/22/2023, 12:15 PM

## 2023-07-22 NOTE — Anesthesia Preprocedure Evaluation (Signed)
Anesthesia Evaluation  Patient identified by MRN, date of birth, ID band Patient awake    Reviewed: Allergy & Precautions, NPO status , Patient's Chart, lab work & pertinent test results  History of Anesthesia Complications Negative for: history of anesthetic complications  Airway Mallampati: IV   Neck ROM: Full    Dental  (+) Missing, Dental Advidsory Given   Pulmonary neg shortness of breath, asthma , sleep apnea and Continuous Positive Airway Pressure Ventilation , neg COPD, neg recent URI, former smoker   Pulmonary exam normal breath sounds clear to auscultation       Cardiovascular hypertension, (-) angina + CAD (s/p MI and CABG), + Past MI and + Peripheral Vascular Disease (carotid disease)  Normal cardiovascular exam(-) dysrhythmias (-) Valvular Problems/Murmurs Rhythm:Regular Rate:Normal  ECG 03/08/22:  Normal sinus rhythm Left axis deviation Left ventricular hypertrophy ( R in aVL , Cornell product , Romhilt-Estes ) Inferior infarct , age undetermined T wave abnormality, consider lateral ischemia  Echo 02/08/22:  NORMAL LEFT VENTRICULAR SYSTOLIC FUNCTION WITH AN ESTIMATED EF = >55 %  NORMAL RIGHT VENTRICULAR SYSTOLIC FUNCTION  MODERATE MITRAL VALVE INSUFFICIENCY  MILD TRICUSPID VALVE INSUFFICIENCY  TRACE AORTIC VALVE INSUFFICIENCY  NO VALVULAR STENOSIS  MODERATE LA ENLARGEMENT  MILD RV ENLARGEMENT  MILD RA ENLARGEMENT    Neuro/Psych  PSYCHIATRIC DISORDERS Anxiety     Diabetic retinopathy CVA (2019, 2021), No Residual Symptoms    GI/Hepatic ,GERD  ,,Colon CA   Endo/Other  diabetes, Type 2Hypothyroidism    Renal/GU Renal disease (stage IV CKD)     Musculoskeletal   Abdominal   Peds  Hematology  (+) Blood dyscrasia, anemia   Anesthesia Other Findings Last dose of Ozempic 08/01/22   Cardiology note 06/19/22:  72 year old gentleman with known coronary disease, status post PTCA D1 06/27/9093, and CABG  times 02/2006 at [facility]. Patient recently seen at [facility] ED 03/08/2022 with chest pain and atypical features, with negative troponin, nondiagnostic chest x-ray and ECGs. Recent 2D echocardiogram 02/08/2022 revealed normal left ventricular function, with LVEF greater than 55%. The patient has essential hypertension, blood pressure adequately controlled on current BP medications. Patient has chronic kidney disease, stage IV. I am very hesitant to recommend cardiac catheterization at this time since patient would be at high risk for contrast-induced nephrotoxicity and renal failure  Plan   1. Continue current medications 2. Counseled patient about low-sodium diet 3. DASH diet printed instructions given to the patient 4. Counseled patient about low-cholesterol diet 5. Continue rosuvastatin for hyper lipid management 6. Low-fat and cholesterol diet printed instructions given to the patient 7. Diabetes diet printed instructions given to the patient 8. Return to clinic for follow-up in 4 months  No orders of the defined types were placed in this encounter.  Return in about 4 months (around 10/19/2022).   Reproductive/Obstetrics                             Anesthesia Physical Anesthesia Plan  ASA: 3  Anesthesia Plan: General   Post-op Pain Management:    Induction: Intravenous  PONV Risk Score and Plan: 2 and Propofol infusion, TIVA, Treatment may vary due to age or medical condition and Ondansetron  Airway Management Planned: LMA  Additional Equipment:   Intra-op Plan:   Post-operative Plan: Extubation in OR  Informed Consent: I have reviewed the patients History and Physical, chart, labs and discussed the procedure including the risks, benefits and alternatives for the proposed anesthesia  with the patient or authorized representative who has indicated his/her understanding and acceptance.     Dental advisory given  Plan Discussed with: CRNA  Anesthesia Plan  Comments:         Anesthesia Quick Evaluation

## 2023-07-22 NOTE — Progress Notes (Signed)
Inpatient Rehab Admissions Coordinator:    CIR following. Note that PT is now recommending HH. Plan for OR tomorrow for excisional biopsy. I will follow up post-op and discuss CIR candidacy at that time.   Megan Salon, MS, CCC-SLP Rehab Admissions Coordinator  [phone] (celll) [phone] (office)

## 2023-07-22 NOTE — Progress Notes (Signed)
OT Cancellation Note  Patient Details Name: ALEXSANDER STAIB MRN: [id number] DOB: 1950/11/18   Cancelled Treatment:    Reason Eval/Treat Not Completed: Patient at procedure or test/ unavailable. Pt off unit this date for excisional biopsy of a left axillary lymph node per chart. Will re-attempt next date as pt available.   Kathie Dike, M.S. OTR/L  07/22/23, 12:51 PM  ascom [phone]

## 2023-07-23 ENCOUNTER — Encounter: Payer: Self-pay | Admitting: General Surgery

## 2023-07-23 DIAGNOSIS — N390 Urinary tract infection, site not specified: Secondary | ICD-10-CM | POA: Diagnosis not present

## 2023-07-23 DIAGNOSIS — A419 Sepsis, unspecified organism: Secondary | ICD-10-CM | POA: Diagnosis not present

## 2023-07-23 LAB — GLUCOSE, CAPILLARY
Glucose-Capillary: 262 mg/dL — ABNORMAL HIGH (ref 70–99)
Glucose-Capillary: 316 mg/dL — ABNORMAL HIGH (ref 70–99)
Glucose-Capillary: 315 mg/dL — ABNORMAL HIGH (ref 70–99)
Glucose-Capillary: 241 mg/dL — ABNORMAL HIGH (ref 70–99)

## 2023-07-23 LAB — CULTURE, BLOOD (ROUTINE X 2)
Culture: NO GROWTH
Special Requests: ADEQUATE

## 2023-07-23 LAB — BASIC METABOLIC PANEL
Anion gap: 5 (ref 5–15)
BUN: 26 mg/dL — ABNORMAL HIGH (ref 8–23)
CO2: 24 mmol/L (ref 22–32)
Calcium: 8.1 mg/dL — ABNORMAL LOW (ref 8.9–10.3)
Chloride: 104 mmol/L (ref 98–111)
Creatinine, Ser: 2.23 mg/dL — ABNORMAL HIGH (ref 0.61–1.24)
GFR, Estimated: 30 mL/min — ABNORMAL LOW (ref >60)
Glucose, Bld: 292 mg/dL — ABNORMAL HIGH (ref 70–99)
Potassium: 5.5 mmol/L — ABNORMAL HIGH (ref 3.5–5.1)
Sodium: 133 mmol/L — ABNORMAL LOW (ref 135–145)

## 2023-07-23 MED ORDER — ASPIRIN 81 MG PO CHEW
81.0000 mg | CHEWABLE_TABLET | Freq: Every day | ORAL | Status: DC
  Administered 2023-07-24: 81 mg via ORAL
  Filled 2023-07-23: qty 1

## 2023-07-23 MED ORDER — CLOPIDOGREL BISULFATE 75 MG PO TABS
75.0000 mg | ORAL_TABLET | Freq: Every day | ORAL | Status: DC
  Administered 2023-07-24: 75 mg via ORAL
  Filled 2023-07-23: qty 1

## 2023-07-23 MED ORDER — INSULIN GLARGINE-YFGN 100 UNIT/ML ~~LOC~~ SOLN
15.0000 [IU] | Freq: Every day | SUBCUTANEOUS | Status: DC
  Administered 2023-07-23 – 2023-07-24 (×2): 15 [IU] via SUBCUTANEOUS
  Filled 2023-07-23 (×2): qty 0.15

## 2023-07-23 MED ORDER — ENOXAPARIN SODIUM 40 MG/0.4ML IJ SOSY
40.0000 mg | PREFILLED_SYRINGE | INTRAMUSCULAR | Status: DC
  Administered 2023-07-23 – 2023-07-24 (×2): 40 mg via SUBCUTANEOUS
  Filled 2023-07-23 (×2): qty 0.4

## 2023-07-23 MED ORDER — GUAIFENESIN 100 MG/5ML PO LIQD
5.0000 mL | ORAL | Status: DC | PRN
  Administered 2023-07-23 – 2023-07-24 (×3): 5 mL via ORAL
  Filled 2023-07-23 (×3): qty 10

## 2023-07-23 NOTE — Progress Notes (Signed)
[facility]  ROUNDING NOTE   Subjective:   Roberto Ingram is a 72 year old male with past medical conditions including diabetes, GERD, bronchitis, CAD, BPH, hypertension, thyroid disease, and chronic kidney disease stage IIIb.  Patient presents to the emergency department complaining of fatigue and fevers.  Patient has been admitted for Dehydration [E86.0] Weakness [R53.1] Sepsis secondary to UTI (HCC) [A41.9, N39.0] Sepsis, due to unspecified organism, unspecified whether acute organ dysfunction present (HCC) [A41.9]  Patient resting in bed Alert and oriented Wife at bedside Tolerating small meals with Nepro  Creatinine 2.23 Urine output 2.4L   Objective:  Vital signs in last 24 hours:  Temp:  [97.5 F (36.4 C)-99.4 F (37.4 C)] 98.6 F (37 C) (09/11 0821) Pulse Rate:  [60-107] 67 (09/11 0821) Resp:  [16-22] 18 (09/11 0821) BP: (108-141)/(54-78) 133/78 (09/11 0821) SpO2:  [94 %-100 %] 96 % (09/11 0821) Weight:  [97.7 kg] 97.7 kg (09/11 0416)  Weight change: -2.1 kg Filed Weights   07/18/23 2028 07/22/23 0500 07/23/23 0416  Weight: 97.5 kg 99.8 kg 97.7 kg    Intake/Output: I/O last 3 completed shifts: In: 600 [I.V.:500; IV Piggyback:100] Out: 3220 [Urine:3200; Blood:20]   Intake/Output this shift:  Total I/O In: 120 [P.O.:120] Out: -   Physical Exam: General: NAD  Head: Normocephalic, atraumatic.  Dry oral mucosal membranes  Eyes: Anicteric,  Lungs:  Clear to auscultation, normal effort  Heart: Regular rate and rhythm  Abdomen:  Soft, nontender,   Extremities:  trace peripheral edema.  Neurologic: Nonfocal, moving all four extremities  Skin: No lesions  Access: None    Basic Metabolic Panel: Recent Labs  Lab 07/18/23 1343 07/19/23 1016 07/20/23 1051 07/21/23 1011 07/22/23 1625 07/23/23 0545  NA 129* 128* 128*  --  132* 133*  K 4.7 4.7 4.9  --  4.6 5.5*  CL 96* 97* 97*  --  100 104  CO2 25 23 24   --  24 24  GLUCOSE 257* 265* 203*   --  131* 292*  BUN 34* 30* 28*  --  22 26*  CREATININE 2.75* 2.80* 3.27*  --  2.56* 2.23*  CALCIUM 8.4* 7.8* 8.1*  --  8.0* 8.1*  PHOS  --   --   --  2.1*  --   --     Liver Function Tests: Recent Labs  Lab 07/18/23 1616 07/19/23 1016  AST 18 17  ALT 16 16  ALKPHOS 60 58  BILITOT 0.6 0.5  PROT 7.3 6.8  ALBUMIN 2.6* 2.4*   Recent Labs  Lab 07/18/23 1616  LIPASE 42   No results for input(s): "AMMONIA" in the last 168 hours.  CBC: Recent Labs  Lab 07/18/23 1343 07/19/23 1016  WBC 13.6* 12.9*  HGB 9.7* 8.9*  HCT 31.2* 28.4*  MCV 85.5 83.8  PLT 345 353    Cardiac Enzymes: No results for input(s): "CKTOTAL", "CKMB", "CKMBINDEX", "TROPONINI" in the last 168 hours.  BNP: Invalid input(s): "POCBNP"  CBG: Recent Labs  Lab 07/22/23 1445 07/22/23 1550 07/22/23 2133 07/23/23 0821 07/23/23 1112  GLUCAP 120* 126* 234* 262* 316*    Microbiology: Results for orders placed or performed during the hospital encounter of 07/18/23  Blood culture (routine x 2)     Status: None   Collection Time: 07/18/23  1:46 PM   Specimen: BLOOD  Result Value Ref Range Status   Specimen Description BLOOD BLOOD LEFT ARM  Final   Special Requests   Final    BOTTLES DRAWN  AEROBIC AND ANAEROBIC Blood Culture adequate volume   Culture   Final    NO GROWTH 5 DAYS Performed at [facility], [address] [place], [place] [address]    Report Status 07/23/2023 FINAL  Final  SARS Coronavirus 2 by RT PCR (hospital order, performed in [facility] hospital lab) *cepheid single result test* Anterior Nasal Swab     Status: None   Collection Time: 07/18/23  1:47 PM   Specimen: Anterior Nasal Swab  Result Value Ref Range Status   SARS Coronavirus 2 by RT PCR NEGATIVE NEGATIVE Final    Comment: (NOTE) SARS-CoV-2 target nucleic acids are NOT DETECTED.  The SARS-CoV-2 RNA is generally detectable in upper and lower respiratory specimens during the acute phase of infection. The  lowest concentration of SARS-CoV-2 viral copies this assay can detect is 250 copies / mL. A negative result does not preclude SARS-CoV-2 infection and should not be used as the sole basis for treatment or other patient management decisions.  A negative result may occur with improper specimen collection / handling, submission of specimen other than nasopharyngeal swab, presence of viral mutation(s) within the areas targeted by this assay, and inadequate number of viral copies (<250 copies / mL). A negative result must be combined with clinical observations, patient history, and epidemiological information.  Fact Sheet for Patients:   RoadLapTop.co.za  Fact Sheet for Healthcare Providers: http://kim-miller.com/  This test is not yet approved or  cleared by the [place] FDA and has been authorized for detection and/or diagnosis of SARS-CoV-2 by FDA under an Emergency Use Authorization (EUA).  This EUA will remain in effect (meaning this test can be used) for the duration of the COVID-19 declaration under Section 564(b)(1) of the Act, 21 U.S.C. section 360bbb-3(b)(1), unless the authorization is terminated or revoked sooner.  Performed at [facility], [address] [place], [place] [address]   Group A Strep by PCR [facility] Only)     Status: None   Collection Time: 07/18/23  4:16 PM   Specimen: Throat; Sterile Swab  Result Value Ref Range Status   Group A Strep by PCR NOT DETECTED NOT DETECTED Final    Comment: Performed at [facility], [address] [place], [place] [address]  Culture, blood (Routine X 2) w Reflex to ID Panel     Status: None (Preliminary result)   Collection Time: 07/19/23  1:09 PM   Specimen: BLOOD  Result Value Ref Range Status   Specimen Description BLOOD BLOOD RIGHT HAND  Final   Special Requests   Final    BOTTLES DRAWN AEROBIC AND ANAEROBIC Blood Culture results may not be optimal due  to an excessive volume of blood received in culture bottles   Culture   Final    NO GROWTH 4 DAYS Performed at [facility], [address] [place], [place] [address]    Report Status PENDING  Incomplete  Respiratory (~20 pathogens) panel by PCR     Status: None   Collection Time: 07/20/23 10:30 AM   Specimen: Nasopharyngeal Swab; Respiratory  Result Value Ref Range Status   Adenovirus NOT DETECTED NOT DETECTED Final   Coronavirus 229E NOT DETECTED NOT DETECTED Final    Comment: (NOTE) The Coronavirus on the Respiratory Panel, DOES NOT test for the novel  Coronavirus (2019 nCoV)    Coronavirus HKU1 NOT DETECTED NOT DETECTED Final   Coronavirus NL63 NOT DETECTED NOT DETECTED Final   Coronavirus OC43 NOT DETECTED NOT DETECTED Final  Metapneumovirus NOT DETECTED NOT DETECTED Final   Rhinovirus / Enterovirus NOT DETECTED NOT DETECTED Final   Influenza A NOT DETECTED NOT DETECTED Final   Influenza B NOT DETECTED NOT DETECTED Final   Parainfluenza Virus 1 NOT DETECTED NOT DETECTED Final   Parainfluenza Virus 2 NOT DETECTED NOT DETECTED Final   Parainfluenza Virus 3 NOT DETECTED NOT DETECTED Final   Parainfluenza Virus 4 NOT DETECTED NOT DETECTED Final   Respiratory Syncytial Virus NOT DETECTED NOT DETECTED Final   Bordetella pertussis NOT DETECTED NOT DETECTED Final   Bordetella Parapertussis NOT DETECTED NOT DETECTED Final   Chlamydophila pneumoniae NOT DETECTED NOT DETECTED Final   Mycoplasma pneumoniae NOT DETECTED NOT DETECTED Final    Comment: Performed at [facility], [address], [place], [place] [address]  Urine Culture (for pregnant, neutropenic or urologic patients or patients with an indwelling urinary catheter)     Status: None   Collection Time: 07/20/23 11:28 AM   Specimen: Urine, Clean Catch  Result Value Ref Range Status   Specimen Description   Final    URINE, CLEAN CATCH Performed at [facility], [address] [place], [place]  [address]    Special Requests   Final    NONE Performed at [facility], [address] [place], [place] [address]    Culture   Final    NO GROWTH Performed at [facility], [address] [place], [place] [address]    Report Status 07/21/2023 FINAL  Final  Aerobic/Anaerobic Culture w Gram Stain (surgical/deep wound)     Status: None (Preliminary result)   Collection Time: 07/22/23  2:19 PM   Specimen: Path Tissue  Result Value Ref Range Status   Specimen Description   Final    TISSUE AXILLA LYMPH NODE Performed at [facility], [address], [place], [place] [address]    Special Requests   Final    NONE Performed at [facility], [address] [place], [place] [address]    Gram Stain NO WBC SEEN NO ORGANISMS SEEN   Final   Culture   Final    NO GROWTH < 12 HOURS Performed at [facility], [address] [place], [place] [address]    Report Status PENDING  Incomplete    Coagulation Studies: No results for input(s): "LABPROT", "INR" in the last 72 hours.   Urinalysis: No results for input(s): "COLORURINE", "LABSPEC", "PHURINE", "GLUCOSEU", "HGBUR", "BILIRUBINUR", "KETONESUR", "PROTEINUR", "UROBILINOGEN", "NITRITE", "LEUKOCYTESUR" in the last 72 hours.  Invalid input(s): "APPERANCEUR"     Imaging: No results found.   Medications:    sodium chloride 10 mL/hr at 07/22/23 1020    amLODipine  10 mg Oral Daily   ammonium lactate  1 Application Topical BID   bisacodyl  10 mg Oral Daily   cloNIDine  0.1 mg Oral BID   enoxaparin (LOVENOX) injection  40 mg Subcutaneous Q24H   feeding supplement (GLUCERNA SHAKE)  237 mL Oral TID BM   insulin aspart  0-5 Units Subcutaneous QHS   insulin aspart  0-9 Units Subcutaneous TID WC   insulin glargine-yfgn  15 Units Subcutaneous Daily   levothyroxine  50 mcg Oral Q0600   LORazepam  0.5 mg Intravenous Once   metoprolol tartrate  50 mg Oral BID   mometasone-formoterol  2 puff Inhalation  BID   multivitamin with minerals  1 tablet Oral Daily   omega-3 acid ethyl esters  1 g Oral Daily  pantoprazole  40 mg Oral BID AC   rosuvastatin  40 mg Oral Daily   senna-docusate  1 tablet Oral BID   tamsulosin  0.4 mg Oral QPC supper   sodium chloride, acetaminophen, albuterol, ALPRAZolam, alum & mag hydroxide-simeth, colchicine, diphenhydrAMINE, fluticasone, guaiFENesin, meclizine, melatonin, nitroGLYCERIN, ondansetron **OR** ondansetron (ZOFRAN) IV, traZODone  Assessment/ Plan:  Mr. MICHAELJAMES HEGLUND is a 72 y.o.  male with past medical conditions including diabetes, GERD, bronchitis, CAD, BPH, hypertension, thyroid disease, and chronic kidney disease stage IIIb.  Patient presents to the emergency department complaining of fatigue and fevers.  Patient has been admitted for Dehydration [E86.0] Weakness [R53.1] Sepsis secondary to UTI (HCC) [A41.9, N39.0] Sepsis, due to unspecified organism, unspecified whether acute organ dysfunction present [facility]) [A41.9]   Acute Kidney Injury with hyponatremia on chronic kidney disease stage IIIb with baseline creatinine 1.8-2.0.  Recent lymph node biopsy completed at [facility] on 9//24, findings inconclusive. Chronic kidney disease is secondary to diabetes and hypertension.  CT chest/abdomen/pelvis negative for hydronephrosis.  Acute kidney injury likely has a component of dehydration due to poor oral intake.    Creatinine has improved some overnight. Continues to have good urine output.  Lymph node biopsy completed on 07/22/23 to evaluate lymphadenopathy.    No acute indication for dialysis. Will continue to monitor renal indices.   Lab Results  Component Value Date   CREATININE 2.23 (H) 07/23/2023   CREATININE 2.56 (H) 07/22/2023   CREATININE 3.27 (H) 07/20/2023    Intake/Output Summary (Last 24 hours) at 07/23/2023 1202 Last data filed at 07/23/2023 0900 Gross per 24 hour  Intake 720 ml  Output 1520 ml  Net -800 ml    2.  Anemia of chronic kidney disease Lab Results  Component Value Date   HGB 8.9 (L) 07/19/2023    Will monitor per anemia protocol  3. Diabetes mellitus type II with chronic kidney disease/renal manifestations: insulin dependent. Home regimen includes aspart and Lantus. Most recent hemoglobin A1c is 7.8 on 07/19/23.  Glucose elevated today. Sliding scale managed by primary team.  4.  Hypertension with chronic kidney disease.  Home regimen includes amlodipine, clonidine, and metoprolol.  Also prescribed tamsulosin.  Currently prescribed the same medications during admission.  Blood pressure acceptable    LOS: 5 Tyler Cubit 9/11/202412:02 PM

## 2023-07-23 NOTE — Progress Notes (Signed)
Physical Therapy Treatment Patient Details Name: Roberto Ingram MRN: [id number] DOB: October 24, 1951 Today's Date: 07/23/2023   History of Present Illness Patient is a 72 year old male with generalized weakness, sore throat, dry mouth, diffuse body aches. Sepsis with suspected UTI. 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    PT Comments  Patient eager to participate and supportive spouse at the beside. Patient required physical assistance for bed mobility. Multiple standing bouts performed with cues for hand placement and safety. Patient completed standing exercises for strengthening. Patient ambulated in hallway with rolling walker with safety cues required, emphasis on slowed ambulation, minimizing distractions, and quality of gait. Mild dyspnea with exertion with Sp02 98% on room air. Patient is fatigued with activity.  Discussed patient's mobility status with OT and mobility specialist. Level of assistance required with mobility throughout the day tends to fluctuate based on fatigue level, etc. The family seems to still be interested in rehab at discharge. The patient will likely require some physical assistance with mobility for safe transition home and could benefit from continued PT to maximize independence and decrease caregiver burden.    If plan is discharge home, recommend the following: A little help with walking and/or transfers;A little help with bathing/dressing/bathroom   Can travel by private vehicle        Equipment Recommendations  BSC/3in1;Other (comment) (tub bench)    Recommendations for Other Services       Precautions / Restrictions Precautions Precautions: Fall Restrictions Weight Bearing Restrictions: No     Mobility  Bed Mobility Overal bed mobility: Needs Assistance Bed Mobility: Supine to Sit, Sit to Supine     Supine to sit: Min assist, HOB elevated, Used rails Sit to supine: Contact guard assist, HOB elevated   General bed mobility  comments: assistance for trunk support to sit upright. verbal cues for sequencing and safety    Transfers Overall transfer level: Needs assistance Equipment used: Rolling walker (2 wheels) Transfers: Sit to/from Stand Sit to Stand: Contact guard assist           General transfer comment: 2 standing bouts performed with cues for task initiation and safety    Ambulation/Gait Ambulation/Gait assistance: Contact guard assist Gait Distance (Feet):  (20, 120, 20) Assistive device: Rolling walker (2 wheels) Gait Pattern/deviations: Decreased stride length, Wide base of support Gait velocity: decreased     General Gait Details: emphasis on safety, quality of ambulation, decreasing ambulation speed for safety. 2 standing rest breaks with ambulation with mild dyspnea with exertion. Sp02 98% on room air after walking. encouraged energy conservation strategies to use in home setting   Stairs             Wheelchair Mobility     Tilt Bed    Modified Rankin (Stroke Patients Only)       Balance Overall balance assessment: Needs assistance Sitting-balance support: No upper extremity supported, Feet supported Sitting balance-Leahy Scale: Fair Sitting balance - Comments: posterior lean initially that is self corrected with feet on the floor and increased sitting time   Standing balance support: Bilateral upper extremity supported Standing balance-Leahy Scale: Fair Standing balance comment: using rolling walker for support in standing                            Cognition Arousal: Alert Behavior During Therapy: WFL for tasks assessed/performed Overall Cognitive Status: Impaired/Different from baseline  Following Commands: Follows one step commands with increased time Safety/Judgement: Decreased awareness of deficits   Problem Solving: Slow processing, Decreased initiation, Requires verbal cues          Exercises General  Exercises - Lower Extremity Hip Flexion/Marching: AROM, Strengthening, Both, 20 reps, Standing (cues for safety)    General Comments General comments (skin integrity, edema, etc.): supportive spouse present throughout session      Pertinent Vitals/Pain Pain Assessment Pain Assessment: No/denies pain    Home Living                          Prior Function            PT Goals (current goals can now be found in the care plan section) Acute Rehab PT Goals Patient Stated Goal: to go home PT Goal Formulation: With patient/family Time For Goal Achievement: 08/03/23 Potential to Achieve Goals: Good Progress towards PT goals: Progressing toward goals    Frequency    Min 1X/week      PT Plan      Co-evaluation              AM-PAC PT "6 Clicks" Mobility   Outcome Measure  Help needed turning from your back to your side while in a flat bed without using bedrails?: A Little Help needed moving from lying on your back to sitting on the side of a flat bed without using bedrails?: A Little Help needed moving to and from a bed to a chair (including a wheelchair)?: A Little Help needed standing up from a chair using your arms (e.g., wheelchair or bedside chair)?: A Little Help needed to walk in hospital room?: A Little Help needed climbing 3-5 steps with a railing? : A Lot 6 Click Score: 17    End of Session Equipment Utilized During Treatment: Gait belt Activity Tolerance: Patient tolerated treatment well Patient left: in bed;with call bell/phone within reach;with bed alarm set;with family/visitor present Nurse Communication: Mobility status PT Visit Diagnosis: Unsteadiness on feet (R26.81);Muscle weakness (generalized) (M62.81)     Time: 1610-9604 PT Time Calculation (min) (ACUTE ONLY): 45 min  Charges:    $Gait Training: 8-22 mins $Therapeutic Exercise: 8-22 mins $Therapeutic Activity: 8-22 mins PT General Charges $$ ACUTE PT VISIT: 1 Visit                      Donna Bernard, PT, MPT    Ina Homes 07/23/2023, 2:44 PM

## 2023-07-23 NOTE — Progress Notes (Signed)
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  He was thought to have sepsis on admission from possible UTI and started on Ceftriaxone.  He was also noted to have AKI.  Surgery was consulted and performed a lymph node biopsy on 9/10.  ID was consulted and recommended completing 5 days of antibiotics and then monitoring off abx.    Assessment and Plan:  Sepsis present on admission exact etiology unknown Presented with WBC 13.6, fever of 102, tachycardia IV Rocephin for presumed UTI Blood and urine cultures are NTD Viral panel-- negative Discussed with infectious disease Dr. Drue Second-- recommends abx x 5 days and then monitoring off abx   Acute kidney injury on chronic kidney disease stage IIIB Baseline creatinine most recent labs from [facility] on July 11, 2023 was 1.8 Came in with creatinine of 2.75, peaked at 3.27 This is suspected from poor PO intake since his recent procedure Nephrology consultation with Dr. 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 Smith Robert FNA results from left axillary lymph node biopsy done at [facility] inconclusive per Dr. Smith Robert, recommends exceptional biopsy from left axilla versus supraclavicular area. Gen. surgery consultation with Dr. Hazle Quant - excisional biopsy done 9/10   Hypertension Continue amlodipine, clonidine, metoprolol   Type II diabetes, hyperglycemia, CKD stage IIIB Recent A1c was 7.8, suboptimal control Glargine continued at  lower dose, titrate as needed Will cover with moderate-scale SSI   Anemia of chronic disease Hemoglobin stable   Hypothyroidism Continue Synthroid   Obstructive sleep apnea Continue CPAP   Generalized weakness with debility PT/OT consults Appears to be appropriate for home health, maybe in 1-2 days  BPH Continue tamsulosin   History of CAD/CVA Will resume ASA and Plavix now that biopsy is done Continue fish oil, rosuvastatin  Anxiety Continue alprazolam  Malnutrition Nutrition Problem: Severe Malnutrition Etiology: acute illness Signs/Symptoms: moderate muscle depletion, percent weight loss Percent weight loss: 12 %        Consultants: ID Surgery  Procedures: L axillary lymph node biopsy 9/10  Antibiotics: Ceftriaxone 9/7-11  30 Day Unplanned Readmission Risk Score    Flowsheet Row ED to Hosp-Admission (Current) from 07/18/2023 in [facility]  30 Day Unplanned Readmission Risk Score (%) 26.67 Filed at 07/23/2023 0801       This score is the patient's risk of an unplanned readmission within 30 days of being discharged (0 -100%). The score is based on dignosis, age, lab data, medications, orders, and past utilization.   Low:  0-14.9   Medium: 15-21.9   High: 22-29.9   Extreme: 30 and above           Subjective: Feeling ok today, no specific complaints, no recent fevers, LN biopsy done.   Objective: Vitals:   07/23/23 0415 07/23/23 0821  BP: (!) 119/54 133/78  Pulse: 60 67  Resp: 18 18  Temp: 97.9 F (36.6 C) 98.6 F (37 C)  SpO2: 96% 96%    Intake/Output Summary (Last 24  hours) at 07/23/2023 1552 Last data filed at 07/23/2023 1500 Gross per 24 hour  Intake 600 ml  Output 1500 ml  Net -900 ml   Filed Weights   07/18/23 2028 07/22/23 0500 07/23/23 0416  Weight: 97.5 kg 99.8 kg 97.7 kg    Exam:  General:  Appears calm and comfortable and is in NAD Eyes:  EOMI, normal lids, iris ENT:  grossly  normal hearing, lips & tongue, mmm Neck:  no LAD, masses or thyromegaly Cardiovascular:  RRR, no m/r/g. No LE edema.  Respiratory:   CTA bilaterally with no wheezes/rales/rhonchi.  Normal respiratory effort. Abdomen:  soft, NT, ND; healed surgical scars Skin:  no rash or induration seen on limited exam Musculoskeletal:  grossly normal tone BUE/BLE, good ROM, no bony abnormality Psychiatric:  grossly normal mood and affect, speech fluent and appropriate, AOx3 Neurologic:  CN 2-12 grossly intact, moves all extremities in coordinated fashion, L arm with limited movement post-procedure  Data Reviewed: I have reviewed the patient's lab results since admission.  Pertinent labs for today include:   Na++ 133 K+ 5.5 Glucose 292 BUN 26/Creatinine 2.23/GFR 30, improving     Family Communication: Wife and sister were present throughout evaluation  Disposition: Status is: Inpatient Remains inpatient appropriate because: ongoing treatment     Time spent: 35 minutes  Unresulted Labs (From admission, onward)     Start     Ordered   07/24/23 0500  CBC with Differential/Platelet  Tomorrow morning,   R       Question:  Specimen collection method  Answer:  Lab=Lab collect   07/23/23 0833   07/24/23 0500  Basic metabolic panel  Tomorrow morning,   R       Question:  Specimen collection method  Answer:  Lab=Lab collect   07/23/23 0833   07/24/23 0500  Sedimentation rate  Tomorrow morning,   R       Question:  Specimen collection method  Answer:  Lab=Lab collect   07/23/23 1005   07/24/23 0500  C-reactive protein  Tomorrow morning,   R       Question:  Specimen collection method  Answer:  Lab=Lab collect   07/23/23 1005   07/22/23 1419  Acid Fast Smear (AFB)  RELEASE UPON ORDERING,   TIMED        07/22/23 1419   07/22/23 1419  Fungus Culture With Stain  RELEASE UPON ORDERING,   TIMED        07/22/23 1419   07/22/23 1419  Acid Fast Culture with reflexed sensitivities  RELEASE UPON  ORDERING,   TIMED        07/22/23 1419             Author: Jonah Blue, MD 07/23/2023 3:52 PM  For on call review [email or web address].

## 2023-07-23 NOTE — Inpatient Diabetes Management (Signed)
Inpatient Diabetes Program Recommendations  AACE/ADA: New Consensus Statement on Inpatient Glycemic Control   Target Ranges:  Prepandial:   less than 140 mg/dL      Peak postprandial:   less than 180 mg/dL (1-2 hours)      Critically ill patients:  140 - 180 mg/dL    Latest Reference Range & Units 07/22/23 09:07 07/22/23 12:05 07/22/23 12:57 07/22/23 14:45 07/22/23 15:50 07/22/23 21:33  Glucose-Capillary 70 - 99 mg/dL 72 69 (L) 94 604 (H) 540 (H) 234 (H)   Review of Glycemic Control  Diabetes history: DM2 Outpatient Diabetes medications: Lantus 68 units daily, Novolog 16 units with breakfast, Novolog 18 units with supper, Ozempic 2 mg Qweek (Thursday) Current orders for Inpatient glycemic control: Semglee 25 units daily, Novolog 0-9 units TID with meals, Novolog 0-5 units QHS  Inpatient Diabetes Program Recommendations:    Insulin: No Semglee was given on 07/22/23 and patient was NPO for procedure yesterday. Noted CBG down to 69 mg/dl at 98:11 on 07/25/77.  Patient received Decadron 5 mg at 13:50 on 07/22/23 and CBG 292 mg/dl this morning (from not getting any Semglee and receiving Decadron). Please consider decreasing Semglee to 15 units daily.  Thanks, Orlando Penner, RN, MSN, CDCES Diabetes Coordinator Inpatient Diabetes Program [phone] (Team Pager from 8am to 5pm)

## 2023-07-23 NOTE — TOC Progression Note (Signed)
Transition of Care [facility]) - Progression Note    Patient Details  Name: Roberto Ingram MRN: [id number] Date of Birth: 08-05-1951  [facility]) CM/SW Contact  Chapman Fitch, RN Phone Number: 07/23/2023, 3:09 PM  Clinical Narrative:     Met with patient and wife at bedside PT recs have been upgraded to home health.  Patient and wife both confirm that patient would like to return home with home health services. They state they do not have a preference of home health agency.  Referral made to [facility] with [facility]  Patient agreeable to [facility].  .Message sent to MD to determine when it is anticipate patient will medically ready for DC in order to get [facility] delivered    Expected Discharge Plan:  (TBD) Barriers to Discharge: Continued Medical Work up  Expected Discharge Plan and Services       Living arrangements for the past 2 months: Single Family Home                                       Social Determinants of Health (SDOH) Interventions SDOH Screenings   Food Insecurity: No Food Insecurity (07/18/2023)  Housing: Low Risk  (07/18/2023)  Transportation Needs: No Transportation Needs (07/18/2023)  Utilities: Not At Risk (07/18/2023)  Financial Resource Strain: Medium Risk (06/10/2023)   Received from [facility]  Physical Activity: Insufficiently Active (03/04/2018)  Social Connections: Socially Integrated (03/04/2018)  Stress: No Stress Concern Present (03/04/2018)  Tobacco Use: Medium Risk (07/22/2023)    Readmission Risk Interventions     No data to display

## 2023-07-23 NOTE — Progress Notes (Signed)
Occupational Therapy Treatment Patient Details Name: Roberto Ingram MRN: [id number] DOB: [birth year] Today's Date: 07/23/2023   History of present illness Patient is a 72 year old male with generalized weakness, sore throat, dry mouth, diffuse body aches. Sepsis with suspected UTI. 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   OT comments  Pt received in bed with family members present, agreeable to OT eval. OT provides education and demonstration using verbal and visual explanation regarding appropriate DME/AE to improve task efficiency and safety at home for ADLs and mobility. Discussed energy conservation strategies and ADL performance to promote independence and to decrease caregiver burden. Of note, pt's performance does seem to vary depending on fatigue levels.  Bed mobility supine to EOB was supervision level, but max effort put forth by patient. He benefits from multimodal cuing due to his decreased processing, demonstrates impulsive behaviors, and fatigues quickly. This session required frequent rest breaks due to decreasing O2 levels with exertion (87-92% on RA). Pt return demonstrated safe, appropriate use of RW 1 out of 6 trials during sit to stands with max multimodal cuing given. Patient completed therapeutic activity with tasks to challenge dynamic standing balance and functional reach. See flowsheet for further details. Based on his activity tolerance and deconditioning, pt will need continued assistance for ADL performance as he is a falls risk. Pt and spouse return verbalizes understanding of education provided t/o session. Pt is progressing toward OT goals and continues to benefit from skilled OT services to maximize return to PLOF and minimize risk of future falls, injury, caregiver burden, and readmission. Will continue to follow POC as written. Discharge recommendation remains appropriate.        If plan is discharge home, recommend the following:  A little help with  walking and/or transfers;A little help with bathing/dressing/bathroom;Assistance with cooking/housework;Direct supervision/assist for medications management;Direct supervision/assist for financial management;Assist for transportation;Help with stairs or ramp for entrance   Equipment Recommendations  BSC/3in1;Tub/shower bench;Other (comment)       Precautions / Restrictions Precautions Precautions: Fall Restrictions Weight Bearing Restrictions: No       Mobility Bed Mobility Overal bed mobility: Needs Assistance Bed Mobility: Supine to Sit, Sit to Supine     Supine to sit: HOB elevated, Supervision, Used rails (while pt did not require physical assistance, it took MAX effort and exertion to sit EOB from supine. He took multiple breaks, HOB elevated, used rails) Sit to supine: Contact guard assist, HOB elevated   General bed mobility comments: verbal cues for sequencing and safety    Transfers Overall transfer level: Needs assistance Equipment used: Rolling walker (2 wheels) Transfers: Sit to/from Stand Sit to Stand: Contact guard assist           General transfer comment: required increased cues (pt can verbalize proper technique, but cannot perform without OT intervention via vcs)     Balance Overall balance assessment: Needs assistance Sitting-balance support: No upper extremity supported, Feet supported Sitting balance-Leahy Scale: Fair     Standing balance support: During functional activity, Single extremity supported, Bilateral upper extremity supported, No upper extremity supported Standing balance-Leahy Scale: Fair Standing balance comment: using rolling walker for support in standing                           ADL either performed or assessed with clinical judgement   ADL Overall ADL's : Needs assistance/impaired  Functional mobility during ADLs: Rolling walker (2 wheels);Cueing for  safety;Contact guard assist General ADL Comments: Pt declines participation in ADLs, completes lateral / forward / backwards steps using RW    Extremity/Trunk Assessment Upper Extremity Assessment Upper Extremity Assessment: Generalized weakness   Lower Extremity Assessment Lower Extremity Assessment: Generalized weakness         Cognition Arousal: Alert Behavior During Therapy: WFL for tasks assessed/performed Overall Cognitive Status: Impaired/Different from baseline Area of Impairment: Problem solving                       Following Commands: Follows one step commands with increased time Safety/Judgement: Decreased awareness of deficits   Problem Solving: Slow processing, Decreased initiation, Requires verbal cues General Comments: mild delays in processing and task initiation, mild impulsive behaviors (pt acknowledges this is why he falls at home), limited recall of new info for safety        Exercises Exercises: General Lower Extremity General Exercises - Lower Extremity Ankle Circles/Pumps: AROM, 15 reps Hip Flexion/Marching: AROM, Strengthening, Both Other Exercises Other Exercises: Extensive education provided on appropraite DME - BSC, TTB, hand held shower head, rails for bathtub, removal of items for fall prevention, bed rails, reacher, etc Other Exercises: STS x 5, 02 sats monitored, dropping to 87% with exertion on RA, but quickly returning to 92%. Pt required multimodal cuing to avoid pulling back on walker each rep, and had difficulties processing throughout Other Exercises: Dynamic reaching activities, alteranting hand placement on RW. Pt able to sustain dynamic balance with no UE support on walker; but mild unsteadiness with eyes closed       General Comments supportive spouse present throughout session    Pertinent Vitals/ Pain       Pain Assessment Pain Assessment: No/denies pain   Frequency  Min 1X/week        Progress Toward  Goals  OT Goals(current goals can now be found in the care plan section)  Progress towards OT goals: Progressing toward goals  Acute Rehab OT Goals OT Goal Formulation: With patient/family Time For Goal Achievement: 08/03/23 Potential to Achieve Goals: Good  Plan         AM-PAC OT "6 Clicks" Daily Activity     Outcome Measure   Help from another person eating meals?: None Help from another person taking care of personal grooming?: A Little Help from another person toileting, which includes using toliet, bedpan, or urinal?: A Lot Help from another person bathing (including washing, rinsing, drying)?: A Lot Help from another person to put on and taking off regular upper body clothing?: A Little Help from another person to put on and taking off regular lower body clothing?: A Lot 6 Click Score: 16    End of Session Equipment Utilized During Treatment: Gait belt;Rolling walker (2 wheels)  OT Visit Diagnosis: Other abnormalities of gait and mobility (R26.89);Muscle weakness (generalized) (M62.81)   Activity Tolerance Patient limited by fatigue   Patient Left in bed;with call bell/phone within reach;with bed alarm set;with family/visitor present   Nurse Communication Other (comment)        Time: 8295-6213 OT Time Calculation (min): 42 min  Charges: OT General Charges $OT Visit: 1 Visit OT Treatments $Self Care/Home Management : 8-22 mins $Therapeutic Activity: 23-37 mins  {Andreas Sobolewski L. Anastasia Tompson, OTR/L  07/23/23, 3:54 PM

## 2023-07-23 NOTE — Progress Notes (Signed)
Mobility Specialist - Progress Note   07/23/23 1029  Mobility  Activity Stood at bedside;Dangled on edge of bed;Ambulated with assistance in hallway  Level of Assistance Contact guard assist, steadying assist  Assistive Device Front wheel walker  Distance Ambulated (ft) 180 ft  Range of Motion/Exercises Active;All extremities  Activity Response Tolerated well  Mobility Referral Yes  $Mobility charge 1 Mobility     Pre-mobility: 75 HR, 92% SpO2 Post-mobility: 72 HR, 91% SpO2   Pt lying in bed upon arrival, utilizing RA. Pt agreeable to activity. Extra time to complete bed mobility without physical assist, pt reports feeling SOB with transfer---O2 low 90s. A little dizzy upon sitting that did resolve. Mildly impulsive with STS this date. VC for hand placement. Noted R lateral lean in sitting. Mild post lean in standing and during ambulation. Increased unsteadiness this date, but no LOB. Upon return to room, pt participated in seated/standing therex including 5x STS. 1 LOB corrected with minA during 1st STS but balance did improve with each attempt. Pt returned to bed with alarm set, needs in reach.    Filiberto Pinks Mobility Specialist 07/23/23, 10:37 AM

## 2023-07-23 NOTE — Progress Notes (Signed)
Inpatient Rehab Admissions Coordinator:    CIR following at a distance. Will likely be able to d/c home with [facility]. Will see how he does with therapy today.   Megan Salon, MS, CCC-SLP Rehab Admissions Coordinator  [phone] (celll) [phone] (office)

## 2023-07-23 NOTE — Hospital Course (Signed)
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  He was thought to have sepsis on admission from possible UTI and started on Ceftriaxone.  He was also noted to have AKI.  Surgery was consulted and performed a lymph node biopsy on 9/10.  ID was consulted and recommended completing 5 days of antibiotics and then monitoring off abx.

## 2023-07-23 NOTE — Progress Notes (Signed)
Patient is not able to walk the distance required to go the bathroom, or he/she is unable to safely negotiate stairs required to access the bathroom.  A 3in1 BSC will alleviate this problem  

## 2023-07-24 DIAGNOSIS — A419 Sepsis, unspecified organism: Secondary | ICD-10-CM | POA: Diagnosis not present

## 2023-07-24 DIAGNOSIS — N390 Urinary tract infection, site not specified: Secondary | ICD-10-CM | POA: Diagnosis not present

## 2023-07-24 LAB — BASIC METABOLIC PANEL
Anion gap: 7 (ref 5–15)
BUN: 28 mg/dL — ABNORMAL HIGH (ref 8–23)
CO2: 26 mmol/L (ref 22–32)
Calcium: 8.2 mg/dL — ABNORMAL LOW (ref 8.9–10.3)
Chloride: 99 mmol/L (ref 98–111)
Creatinine, Ser: 2.21 mg/dL — ABNORMAL HIGH (ref 0.61–1.24)
GFR, Estimated: 31 mL/min — ABNORMAL LOW (ref >60)
Glucose, Bld: 186 mg/dL — ABNORMAL HIGH (ref 70–99)
Potassium: 4.7 mmol/L (ref 3.5–5.1)
Sodium: 132 mmol/L — ABNORMAL LOW (ref 135–145)

## 2023-07-24 LAB — C-REACTIVE PROTEIN: CRP: 9 mg/dL — ABNORMAL HIGH (ref <1.0)

## 2023-07-24 LAB — CULTURE, BLOOD (ROUTINE X 2): Culture: NO GROWTH

## 2023-07-24 LAB — CBC WITH DIFFERENTIAL/PLATELET
Abs Immature Granulocytes: 0.15 10*3/uL — ABNORMAL HIGH (ref 0.00–0.07)
Basophils Absolute: 0 10*3/uL (ref 0.0–0.1)
Basophils Relative: 0 %
Eosinophils Absolute: 0.1 10*3/uL (ref 0.0–0.5)
Eosinophils Relative: 1 %
HCT: 26 % — ABNORMAL LOW (ref 39.0–52.0)
Hemoglobin: 8.4 g/dL — ABNORMAL LOW (ref 13.0–17.0)
Immature Granulocytes: 1 %
Lymphocytes Relative: 14 %
Lymphs Abs: 2.6 10*3/uL (ref 0.7–4.0)
MCH: 26.7 pg (ref 26.0–34.0)
MCHC: 32.3 g/dL (ref 30.0–36.0)
MCV: 82.5 fL (ref 80.0–100.0)
Monocytes Absolute: 0.8 10*3/uL (ref 0.1–1.0)
Monocytes Relative: 4 %
Neutro Abs: 15.6 10*3/uL — ABNORMAL HIGH (ref 1.7–7.7)
Neutrophils Relative %: 80 %
Platelets: 477 10*3/uL — ABNORMAL HIGH (ref 150–400)
RBC: 3.15 MIL/uL — ABNORMAL LOW (ref 4.22–5.81)
RDW: 16.9 % — ABNORMAL HIGH (ref 11.5–15.5)
WBC: 19.3 10*3/uL — ABNORMAL HIGH (ref 4.0–10.5)
nRBC: 0 % (ref 0.0–0.2)

## 2023-07-24 LAB — ACID FAST SMEAR (AFB, MYCOBACTERIA): Acid Fast Smear: NEGATIVE

## 2023-07-24 LAB — GLUCOSE, CAPILLARY
Glucose-Capillary: 171 mg/dL — ABNORMAL HIGH (ref 70–99)
Glucose-Capillary: 166 mg/dL — ABNORMAL HIGH (ref 70–99)

## 2023-07-24 LAB — SEDIMENTATION RATE: Sed Rate: 63 mm/h — ABNORMAL HIGH (ref 0–20)

## 2023-07-24 MED ORDER — INSULIN GLARGINE-YFGN 100 UNIT/ML ~~LOC~~ SOLN: 15.0000 [IU] | Freq: Every day | SUBCUTANEOUS | 11 refills | Status: AC

## 2023-07-24 MED ORDER — OXYCODONE HCL 5 MG PO TABS
5.0000 mg | ORAL_TABLET | ORAL | Status: DC | PRN
  Administered 2023-07-24: 5 mg via ORAL
  Filled 2023-07-24: qty 1

## 2023-07-24 MED ORDER — OXYCODONE HCL 5 MG PO TABS: 5.0000 mg | ORAL_TABLET | ORAL | 0 refills | Status: AC | PRN

## 2023-07-24 MED ORDER — ADULT MULTIVITAMIN W/MINERALS CH: 1.0000 | ORAL_TABLET | Freq: Every day | ORAL | Status: AC

## 2023-07-24 MED ORDER — ALBUTEROL SULFATE HFA 108 (90 BASE) MCG/ACT IN AERS: 2.0000 | INHALATION_SPRAY | Freq: Four times a day (QID) | RESPIRATORY_TRACT | 1 refills | Status: AC | PRN

## 2023-07-24 MED ORDER — GLUCERNA SHAKE PO LIQD: 237.0000 mL | Freq: Three times a day (TID) | ORAL | 0 refills | Status: AC

## 2023-07-24 MED ORDER — SENNOSIDES-DOCUSATE SODIUM 8.6-50 MG PO TABS: 1.0000 | ORAL_TABLET | Freq: Two times a day (BID) | ORAL | 1 refills | Status: DC

## 2023-07-24 MED ORDER — POLYVINYL ALCOHOL 1.4 % OP SOLN
1.0000 [drp] | OPHTHALMIC | Status: DC | PRN
  Administered 2023-07-24: 1 [drp] via OPHTHALMIC
  Filled 2023-07-24: qty 15

## 2023-07-24 NOTE — Anesthesia Postprocedure Evaluation (Signed)
Anesthesia Post Note  Patient: CHRISTPHOR LASHUA  Procedure(s) Performed: LEFT AXILLARY LYMPH NODE BIOPSY (Left: Axilla)  Patient location during evaluation: PACU Anesthesia Type: General Level of consciousness: awake and alert Pain management: pain level controlled Vital Signs Assessment: post-procedure vital signs reviewed and stable Respiratory status: spontaneous breathing, nonlabored ventilation, respiratory function stable and patient connected to nasal cannula oxygen Cardiovascular status: blood pressure returned to baseline and stable Postop Assessment: no apparent nausea or vomiting Anesthetic complications: no   There were no known notable events for this encounter.   Last Vitals:  Vitals:   07/24/23 0500 07/24/23 0752  BP: 139/61 130/70  Pulse: 72 73  Resp: 18 (!) 22  Temp: 37.4 C 37.2 C  SpO2: 94% 96%    Last Pain:  Vitals:   07/24/23 0752  TempSrc: Oral  PainSc:                  Yevette Edwards

## 2023-07-24 NOTE — TOC Transition Note (Signed)
Transition of Care [facility]) - CM/SW Discharge Note   Patient Details  Name: Roberto Ingram MRN: [id number] Date of Birth: [birth year]  Transition of Care [facility]) CM/SW Contact:  Chapman Fitch, RN Phone Number: 07/24/2023, 2:13 PM   Clinical Narrative:    Patient to discharge today BSC delivered by Cletis Athens with August Luz with [facility] notified of discharge      Barriers to Discharge: Continued Medical Work up   Patient Goals and CMS Choice CMS Medicare.gov Compare Post Acute Care list provided to:: Patient Choice offered to / list presented to : Patient  Discharge Placement                         Discharge Plan and Services Additional resources added to the After Visit Summary for                                       Social Determinants of Health (SDOH) Interventions SDOH Screenings   Food Insecurity: No Food Insecurity (07/18/2023)  Housing: Low Risk  (07/18/2023)  Transportation Needs: No Transportation Needs (07/18/2023)  Utilities: Not At Risk (07/18/2023)  Financial Resource Strain: Medium Risk (06/10/2023)   Received from [facility]  Physical Activity: Insufficiently Active (03/04/2018)  Social Connections: Socially Integrated (03/04/2018)  Stress: No Stress Concern Present (03/04/2018)  Tobacco Use: Medium Risk (07/22/2023)     Readmission Risk Interventions     No data to display

## 2023-07-24 NOTE — Plan of Care (Signed)
  Problem: Education: Goal: Knowledge of General Education information will improve Description: Including pain rating scale, medication(s)/side effects and non-pharmacologic comfort measures Outcome: Progressing   Problem: Health Behavior/Discharge Planning: Goal: Ability to manage health-related needs will improve Outcome: Progressing   Problem: Clinical Measurements: Goal: Ability to maintain clinical measurements within normal limits will improve Outcome: Progressing Goal: Will remain free from infection Outcome: Progressing   Problem: Clinical Measurements: Goal: Will remain free from infection Outcome: Progressing   

## 2023-07-24 NOTE — Care Management Important Message (Signed)
Important Message  Patient Details  Name: Roberto Ingram MRN: [id number] Date of Birth: Sep 30, 1951   Medicare Important Message Given:  Yes     [name] 07/24/2023, 1:27 PM

## 2023-07-24 NOTE — Discharge Summary (Signed)
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; follow up with oncology at [facility] tomorrow (9/13) as scheduled Renal function is improving but still slightly worse than baseline; labs are ordered at oncology for tomorrow You are being discharged with a limited number of Oxycodone tablets to help with breakthrough pain from the biopsy; do not drive or make important decisions while taking this medication Follow up with NP Strader in 1-2 weeks  Discharge Diagnoses: Principal Problem:   Sepsis secondary to UTI [facility]) Active Problems:   Type 2 diabetes mellitus with hyperlipidemia (HCC)   Essential hypertension   Hypothyroidism (acquired)   Hyperlipemia   OSA (obstructive sleep apnea)   Obstructive sleep apnea on CPAP   AKI (acute kidney injury) (HCC)   Protein-calorie malnutrition, severe    Hospital Course: 72yo with h/o CAD, DM, BPH, HTN, stage 3b CKD, hypothyroidism, HLD, and 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 who presented on 9/6 with flu-like symptoms.  He was thought to have sepsis on admission from possible UTI and started on Ceftriaxone.  He was also noted to have AKI.  Surgery was consulted and performed a lymph node biopsy on 9/10.  ID was consulted and recommended completing 5 days of antibiotics and then monitoring off abx.    Assessment and Plan:  Sepsis present on admission exact etiology unknown Presented with WBC 13.6, fever of 102, tachycardia IV Rocephin for presumed UTI Blood and urine cultures are NTD Viral panel-- negative Discussed with infectious disease Dr. Drue Second-- recommends abx x 5 days and then monitoring off abx He has been off antibiotics for >24 hours without  recurrence of fever   Acute kidney injury on chronic kidney disease stage IIIB Baseline creatinine most recent labs from [facility] on July 11, 2023 was 1.8 Came in with creatinine of 2.75, peaked at 3.27 This is suspected from poor PO intake since his recent procedure Nephrology consultation with Dr. 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 Smith Robert FNA results from left axillary lymph node biopsy done at [facility] inconclusive per Dr. Smith Robert, recommends exceptional biopsy from left axilla versus supraclavicular area. Gen. surgery consultation with Dr. 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 Pain control with a limited number of Oxy IR post-operatively   Hypertension Continue amlodipine, clonidine, metoprolol   Type II diabetes, hyperglycemia, CKD stage IIIB Recent A1c was 7.8, suboptimal control Glargine continued at lower dose, titrate as needed with PCP/endocrinology Resume Novolog with meals   Anemia of chronic disease Hemoglobin stable   Hypothyroidism Continue Synthroid   Obstructive sleep apnea Continue CPAP   Generalized weakness with debility PT/OT consults Appears to be appropriate for home health, ordered   BPH Continue tamsulosin   History of CAD/CVA Resumed ASA and Plavix post-biopsy Continue fish oil, rosuvastatin   Anxiety Continue alprazolam   Malnutrition Nutrition Problem: Severe Malnutrition Etiology: acute illness Signs/Symptoms: moderate muscle depletion, percent weight loss Percent weight loss: 12 %  Consultants: ID Surgery   Procedures: L axillary lymph node biopsy 9/10   Antibiotics: Ceftriaxone 9/7-11       30 Day Unplanned Readmission Risk  Score     Flowsheet Row ED to Hosp-Admission (Current) from 07/18/2023 in [facility]  30 Day Unplanned Readmission Risk Score (%) 29.74 Filed at 07/24/2023 0801           This score is the patient's risk of an unplanned readmission within 30 days of being discharged (0 -100%). The score is based on dignosis, age, lab data, medications, orders, and past utilization.   Low:  0-14.9   Medium: 15-21.9   High: 22-29.9   Extreme: 30 and above             Pain control - [place] Controlled Substance Reporting System database was reviewed. and patient was instructed, not to drive, operate heavy machinery, perform activities at heights, swimming or participation in water activities or provide baby-sitting services while on Pain, Sleep and Anxiety Medications; until their outpatient Physician has advised to do so again. Also recommended to not to take more than prescribed Pain, Sleep and Anxiety Medications.    Disposition: Home Diet recommendation:  Cardiac diet DISCHARGE MEDICATION: Allergies as of 07/24/2023       Reactions   Antihistamines, Chlorpheniramine-type Swelling   Prostat   Diphenhydramine Hcl Swelling   Fluorescein Dermatitis, Hives, Itching, Other (See Comments)   Iodinated Contrast Media Other (See Comments)   Lisinopril Swelling   Ezetimibe Hives, Other (See Comments)   Furosemide Swelling   Metformin Hives, Other (See Comments)   Metoprolol Itching   Metoprolol Tartrate Itching   Nsaids Other (See Comments)   Does take aspirin   Sulfa Antibiotics Hives, Other (See Comments)   Brilinta [ticagrelor] Other (See Comments)   Headaches, off balance, no appetite   Diphenhydramine Hcl Other (See Comments)   "The more I take, it makes my prostate swell..."   Other Itching, Other (See Comments)   FA Dye Prostate swelling FA Dye Prostate swelling        Medication List     STOP taking these medications    Lantus SoloStar  100 UNIT/ML Solostar Pen Generic drug: insulin glargine   sulfamethoxazole-trimethoprim 800-160 MG tablet Commonly known as: BACTRIM DS       TAKE these medications    acetaminophen 325 MG tablet Commonly known as: TYLENOL Take 650 mg by mouth every 6 (six) hours as needed.   albuterol 108 (90 Base) MCG/ACT inhaler Commonly known as: VENTOLIN HFA Inhale 2 puffs into the lungs every 6 (six) hours as needed for wheezing or shortness of breath.   allopurinol 100 MG tablet Commonly known as: ZYLOPRIM Take 100 mg by mouth daily as needed (gout).   ALPRAZolam 0.5 MG tablet Commonly known as: XANAX Take 0.5 mg by mouth at bedtime as needed for sleep.   amLODipine 10 MG tablet Commonly known as: NORVASC Take 10 mg by mouth daily.   ammonium lactate 12 % cream Commonly known as: AMLACTIN Apply 1 Application topically 2 (two) times daily. Apply to arms, legs, and body one to two times daily   ascorbic acid 500 MG tablet Commonly known as: VITAMIN C Take 500 mg by mouth daily.   aspirin 81 MG chewable tablet Chew 81 mg by mouth daily.   cloNIDine 0.1 MG tablet Commonly known as: CATAPRES Take 0.1 mg by mouth 2 (two) times daily.   clopidogrel 75 MG tablet  Commonly known as: PLAVIX Take 75 mg by mouth daily.   colchicine 0.6 MG tablet Take 0.6 mg by mouth as needed (gout flares).   Crestor 40 MG tablet Generic drug: rosuvastatin Take 40 mg by mouth daily.   diphenhydrAMINE 25 mg capsule Commonly known as: Benadryl Allergy Take 1 capsule (25 mg total) by mouth every 6 (six) hours as needed.   feeding supplement (GLUCERNA SHAKE) Liqd Take 237 mLs by mouth 3 (three) times daily between meals.   Fish Oil 1000 MG Caps Take 1,000 mg by mouth daily.   fluticasone 50 MCG/ACT nasal spray Commonly known as: FLONASE Place 2 sprays into both nostrils daily as needed for allergies or rhinitis.   fluticasone-salmeterol 250-50 MCG/ACT Aepb Commonly known as:  ADVAIR Inhale 1 puff into the lungs 2 (two) times daily.   insulin aspart 100 UNIT/ML injection Commonly known as: novoLOG Inject 16-18 Units into the skin 3 (three) times daily before meals. 16 units with breakfast and lunch 18 units with dinner   insulin glargine-yfgn 100 UNIT/ML injection Commonly known as: SEMGLEE Inject 0.15 mLs (15 Units total) into the skin daily. Start taking on: July 25, 2023   metoprolol tartrate 25 MG tablet Commonly known as: LOPRESSOR Take 50 mg by mouth 2 (two) times daily.   multivitamin with minerals Tabs tablet Take 1 tablet by mouth daily. Start taking on: July 25, 2023   nitroGLYCERIN 0.4 MG SL tablet Commonly known as: NITROSTAT Place 0.4 mg under the tongue every 5 (five) minutes as needed for chest pain.   oxyCODONE 5 MG immediate release tablet Commonly known as: Oxy IR/ROXICODONE Take 1 tablet (5 mg total) by mouth every 4 (four) hours as needed for breakthrough pain.   Ozempic (2 MG/DOSE) 8 MG/3ML Sopn Generic drug: Semaglutide (2 MG/DOSE) Inject 2 mg into the skin every Thursday.   pantoprazole 40 MG tablet Commonly known as: PROTONIX Take 40 mg by mouth 2 (two) times daily.   senna-docusate 8.6-50 MG tablet Commonly known as: Senokot-S Take 1 tablet by mouth 2 (two) times daily.   Synthroid 50 MCG tablet Generic drug: levothyroxine Take 50 mcg by mouth daily before breakfast.   tamsulosin 0.4 MG Caps capsule Commonly known as: FLOMAX Take 0.4 mg by mouth daily after supper.   Vitamin D (Ergocalciferol) 1.25 MG (50000 UNIT) Caps capsule Commonly known as: DRISDOL Take 50,000 Units by mouth every 30 (thirty) days. Takes on 1st of each month               Durable Medical Equipment  (From admission, onward)           Start     Ordered   07/23/23 1516  For home use only DME Bedside commode  Once       Question:  Patient needs a bedside commode to treat with the following condition  Answer:  Weakness    07/23/23 1515            Discharge Exam: Filed Weights   07/22/23 0500 07/23/23 0416 07/24/23 0500  Weight: 99.8 kg 97.7 kg 100.2 kg    Subjective: Was feeling ok until he got the pathology news.  He and his wife have many questions, he is not sure he would pursue chemotherapy.  Has appt at [facility] tomorrow (labs at 1230, MD at 130) and agrees with plan for dc today and f/u there tomorrow as scheduled.   Objective: Vitals:   07/24/23 0500 07/24/23 0752  BP: 139/61 130/70  Pulse: 72 73  Resp: 18 (!) 22  Temp: 99.4 F (37.4 C) 99 F (37.2 C)  SpO2: 94% 96%    Intake/Output Summary (Last 24 hours) at 07/24/2023 1356 Last data filed at 07/23/2023 2100 Gross per 24 hour  Intake 840 ml  Output --  Net 840 ml   Filed Weights   07/22/23 0500 07/23/23 0416 07/24/23 0500  Weight: 99.8 kg 97.7 kg 100.2 kg    Exam:   General:  Appears calm and comfortable and is in NAD Eyes:  EOMI, normal lids, iris ENT:  grossly normal hearing, lips & tongue, mmm Neck:  no LAD, masses or thyromegaly Cardiovascular:  RRR, no m/r/g. No LE edema.  Respiratory:   CTA bilaterally with no wheezes/rales/rhonchi.  Normal respiratory effort. Abdomen:  soft, NT, ND; healed surgical scars Skin:  no rash or induration seen on limited exam; L axillary wound is clean/dry/intact Musculoskeletal:  grossly normal tone BUE/BLE, good ROM, no bony abnormality Psychiatric:  blunted mood and affect, speech fluent and appropriate, AOx3 Neurologic:  CN 2-12 grossly intact, moves all extremities in coordinated fashion  Data Reviewed: I have reviewed the patient's lab results since admission.  Pertinent labs for today include:   Na++ 132 Glucose 186 BUN 28/Creatinine 2.21/GFR 31 WBC 19.3 Hgb 8.4 Platelets 477 ESR 63    Condition at discharge: stable  The results of significant diagnostics from this hospitalization (including imaging, microbiology, ancillary and laboratory) are listed below for  reference.   Imaging Studies: CT CHEST ABDOMEN PELVIS WO CONTRAST  Result Date: 07/18/2023 CLINICAL DATA:  Sepsis flu like symptoms abdominal surgery fever EXAM: CT CHEST, ABDOMEN AND PELVIS WITHOUT CONTRAST TECHNIQUE: Multidetector CT imaging of the chest, abdomen and pelvis was performed following the standard protocol without IV contrast. RADIATION DOSE REDUCTION: This exam was performed according to the departmental dose-optimization program which includes automated exposure control, adjustment of the mA and/or kV according to patient size and/or use of iterative reconstruction technique. COMPARISON:  CT 06/23/2023 FINDINGS: CT CHEST FINDINGS Cardiovascular: Limited assessment without intravenous contrast. Moderate aortic atherosclerosis. Post CABG changes. Coronary vascular calcifications. Borderline cardiomegaly. No pericardial effusion. Mediastinum/Nodes: Midline trachea. No thyroid mass. Enlarged prevascular lymph node measuring 17 mm series 2, image 16 increased compared to prior. Mild diffuse enlargement of multiple prevascular nodes. Esophagus within normal limits. Multiple enlarged left axillary lymph nodes with dominant left subpectoral node measuring 5.7 x 3.9 cm, previously 4.3 x 3.5 cm. Interval enlargement of multiple additional left axillary nodes with associated soft tissue stranding. Incompletely visualized left supraclavicular mass measuring at least 6.9 by 5.3 cm, series 2, image 1, also increased compared to prior. Lungs/Pleura: Emphysema. No acute airspace disease, pleural effusion, or pneumothorax. No suspicious pulmonary nodules. Musculoskeletal: Sternotomy.  No acute osseous abnormality. CT ABDOMEN PELVIS FINDINGS Hepatobiliary: No focal liver abnormality is seen. No gallstones, gallbladder wall thickening, or biliary dilatation. Pancreas: Unremarkable. No pancreatic ductal dilatation or surrounding inflammatory changes. Spleen: Normal in size without focal abnormality.  Adrenals/Urinary Tract: Adrenal glands are within normal limits. Moderate nonspecific perinephric fat stranding. No hydronephrosis. The bladder is unremarkable. Stomach/Bowel: Postsurgical changes of the stomach. No dilated small bowel. No acute bowel wall thickening. Negative appendix. Vascular/Lymphatic: Moderate aortic atherosclerosis. Increased size of right Peri aortic retroperitoneal lymph node on series 2, image 52 measuring 1.6 cm. Multiple subcentimeter Peri aortic nodes mildly increased. Slight interval increase in size of multiple subcentimeter iliac nodes. Multiple enlarged right pelvic sidewall and external iliac nodes, for example 2 cm lymph  node on series 2, image 105. Right external iliac node measuring 3.1 cm on series 2, image 110, previously 2.3 cm. Ill-defined right groin mass measuring 4.1 cm, previously 4.6 cm. Multiple additional mildly enlarged left groin lymph nodes. Reproductive: Slightly enlarged prostate Other: Negative for pelvic effusion or free air. Skin thickening and subcutaneous infiltration along the anterior abdominal wall possibly due to cutaneous injection. Musculoskeletal: No acute or suspicious osseous abnormality IMPRESSION: 1. Negative for acute airspace disease.  Emphysema. 2. Increased left axillary, mediastinal, and retroperitoneal adenopathy. Increased size of incompletely visualized left supraclavicular mass/suspected adenopathy. Persistent right pelvic, external iliac and right groin adenopathy. Findings could be secondary to metastatic disease or lymphoproliferative disease. Tissue sampling if not already performed is suggested. 3. Nonspecific bilateral perinephric stranding without hydronephrosis. No convincing CT evidence for acute intra-abdominal or pelvic abnormality. Aortic Atherosclerosis (ICD10-I70.0) and Emphysema (ICD10-J43.9). Electronically Signed   By: Jasmine Pang M.D.   On: 07/18/2023 18:07    Microbiology: Results for orders placed or performed  during the hospital encounter of 07/18/23  Blood culture (routine x 2)     Status: None   Collection Time: 07/18/23  1:46 PM   Specimen: BLOOD  Result Value Ref Range Status   Specimen Description BLOOD BLOOD LEFT ARM  Final   Special Requests   Final    BOTTLES DRAWN AEROBIC AND ANAEROBIC Blood Culture adequate volume   Culture   Final    NO GROWTH 5 DAYS Performed at [facility], [address] [place], [place] [address]    Report Status 07/23/2023 FINAL  Final  SARS Coronavirus 2 by RT PCR (hospital order, performed in [facility] hospital lab) *cepheid single result test* Anterior Nasal Swab     Status: None   Collection Time: 07/18/23  1:47 PM   Specimen: Anterior Nasal Swab  Result Value Ref Range Status   SARS Coronavirus 2 by RT PCR NEGATIVE NEGATIVE Final    Comment: (NOTE) SARS-CoV-2 target nucleic acids are NOT DETECTED.  The SARS-CoV-2 RNA is generally detectable in upper and lower respiratory specimens during the acute phase of infection. The lowest concentration of SARS-CoV-2 viral copies this assay can detect is 250 copies / mL. A negative result does not preclude SARS-CoV-2 infection and should not be used as the sole basis for treatment or other patient management decisions.  A negative result may occur with improper specimen collection / handling, submission of specimen other than nasopharyngeal swab, presence of viral mutation(s) within the areas targeted by this assay, and inadequate number of viral copies (<250 copies / mL). A negative result must be combined with clinical observations, patient history, and epidemiological information.  Fact Sheet for Patients:   RoadLapTop.co.za  Fact Sheet for Healthcare Providers: http://kim-miller.com/  This test is not yet approved or  cleared by the [place] FDA and has been authorized for detection and/or diagnosis of SARS-CoV-2 by FDA under an Emergency  Use Authorization (EUA).  This EUA will remain in effect (meaning this test can be used) for the duration of the COVID-19 declaration under Section 564(b)(1) of the Act, 21 U.S.C. section 360bbb-3(b)(1), unless the authorization is terminated or revoked sooner.  Performed at [facility], [address] [place], [place] [address]   Group A Strep by PCR [facility] Only)     Status: None   Collection Time: 07/18/23  4:16 PM   Specimen: Throat; Sterile Swab  Result Value Ref Range Status   Group A Strep by PCR NOT  DETECTED NOT DETECTED Final    Comment: Performed at [facility], [address] [place], [place] [address]  Culture, blood (Routine X 2) w Reflex to ID Panel     Status: None   Collection Time: 07/19/23  1:09 PM   Specimen: BLOOD  Result Value Ref Range Status   Specimen Description BLOOD BLOOD RIGHT HAND  Final   Special Requests   Final    BOTTLES DRAWN AEROBIC AND ANAEROBIC Blood Culture results may not be optimal due to an excessive volume of blood received in culture bottles   Culture   Final    NO GROWTH 5 DAYS Performed at [facility], [address] [place], [place] [address]    Report Status 07/24/2023 FINAL  Final  Respiratory (~20 pathogens) panel by PCR     Status: None   Collection Time: 07/20/23 10:30 AM   Specimen: Nasopharyngeal Swab; Respiratory  Result Value Ref Range Status   Adenovirus NOT DETECTED NOT DETECTED Final   Coronavirus 229E NOT DETECTED NOT DETECTED Final    Comment: (NOTE) The Coronavirus on the Respiratory Panel, DOES NOT test for the novel  Coronavirus (2019 nCoV)    Coronavirus HKU1 NOT DETECTED NOT DETECTED Final   Coronavirus NL63 NOT DETECTED NOT DETECTED Final   Coronavirus OC43 NOT DETECTED NOT DETECTED Final   Metapneumovirus NOT DETECTED NOT DETECTED Final   Rhinovirus / Enterovirus NOT DETECTED NOT DETECTED Final   Influenza A NOT DETECTED NOT DETECTED Final   Influenza B NOT DETECTED NOT  DETECTED Final   Parainfluenza Virus 1 NOT DETECTED NOT DETECTED Final   Parainfluenza Virus 2 NOT DETECTED NOT DETECTED Final   Parainfluenza Virus 3 NOT DETECTED NOT DETECTED Final   Parainfluenza Virus 4 NOT DETECTED NOT DETECTED Final   Respiratory Syncytial Virus NOT DETECTED NOT DETECTED Final   Bordetella pertussis NOT DETECTED NOT DETECTED Final   Bordetella Parapertussis NOT DETECTED NOT DETECTED Final   Chlamydophila pneumoniae NOT DETECTED NOT DETECTED Final   Mycoplasma pneumoniae NOT DETECTED NOT DETECTED Final    Comment: Performed at [facility], [address] [place], [place] [address]  Urine Culture (for pregnant, neutropenic or urologic patients or patients with an indwelling urinary catheter)     Status: None   Collection Time: 07/20/23 11:28 AM   Specimen: Urine, Clean Catch  Result Value Ref Range Status   Specimen Description   Final    URINE, CLEAN CATCH Performed at [facility], [address], [place], [place] [address]    Special Requests   Final    NONE Performed at [facility], [address] [place], [place] [address]    Culture   Final    NO GROWTH Performed at [facility], [address] [place], [place] [address]    Report Status 07/21/2023 FINAL  Final  Aerobic/Anaerobic Culture w Gram Stain (surgical/deep wound)     Status: None (Preliminary result)   Collection Time: 07/22/23  2:19 PM   Specimen: Path Tissue  Result Value Ref Range Status   Specimen Description   Final    TISSUE AXILLA LYMPH NODE Performed at [facility], [address] [place], [place] [address]    Special Requests   Final    NONE Performed at [facility], [address] [place], [place] [address]    Gram Stain NO WBC SEEN NO ORGANISMS SEEN   Final   Culture   Final  NO GROWTH 2 DAYS Performed at [facility], [address] [place], [place] [address]    Report Status PENDING  Incomplete     Labs: CBC: Recent Labs  Lab 07/18/23 1343 07/19/23 1016 07/24/23 0443  WBC 13.6* 12.9* 19.3*  NEUTROABS  --   --  15.6*  HGB 9.7* 8.9* 8.4*  HCT 31.2* 28.4* 26.0*  MCV 85.5 83.8 82.5  PLT 345 353 477*   Basic Metabolic Panel: Recent Labs  Lab 07/19/23 1016 07/20/23 1051 07/21/23 1011 07/22/23 1625 07/23/23 0545 07/24/23 0443  NA 128* 128*  --  132* 133* 132*  K 4.7 4.9  --  4.6 5.5* 4.7  CL 97* 97*  --  100 104 99  CO2 23 24  --  24 24 26   GLUCOSE 265* 203*  --  131* 292* 186*  BUN 30* 28*  --  22 26* 28*  CREATININE 2.80* 3.27*  --  2.56* 2.23* 2.21*  CALCIUM 7.8* 8.1*  --  8.0* 8.1* 8.2*  PHOS  --   --  2.1*  --   --   --    Liver Function Tests: Recent Labs  Lab 07/18/23 1616 07/19/23 1016  AST 18 17  ALT 16 16  ALKPHOS 60 58  BILITOT 0.6 0.5  PROT 7.3 6.8  ALBUMIN 2.6* 2.4*   CBG: Recent Labs  Lab 07/23/23 1112 07/23/23 1705 07/23/23 2114 07/24/23 0754 07/24/23 1150  GLUCAP 316* 315* 241* 171* 166*    Discharge time spent: greater than 30 minutes.  Signed: Jonah Blue, MD [facility] 07/24/2023

## 2023-07-24 NOTE — Inpatient Diabetes Management (Signed)
Inpatient Diabetes Program Recommendations  AACE/ADA: New Consensus Statement on Inpatient Glycemic Control   Target Ranges:  Prepandial:   less than 140 mg/dL      Peak postprandial:   less than 180 mg/dL (1-2 hours)      Critically ill patients:  140 - 180 mg/dL    Latest Reference Range & Units 07/24/23 04:43  Glucose 70 - 99 mg/dL 914 (H)    Latest Reference Range & Units 07/23/23 08:21 07/23/23 11:12 07/23/23 17:05 07/23/23 21:14  Glucose-Capillary 70 - 99 mg/dL 782 (H) 956 (H) 213 (H) 241 (H)   Review of Glycemic Control  iabetes history: DM2 Outpatient Diabetes medications: Lantus 68 units daily, Novolog 16 units with breakfast, Novolog 18 units with supper, Ozempic 2 mg Qweek (Thursday) Current orders for Inpatient glycemic control: Semglee 15 units daily, Novolog 0-9 units TID with meals, Novolog 0-5 units QHS   Inpatient Diabetes Program Recommendations:     Insulin:  Please consider ordering Novolog 5 units TID with meals for meal coverage if patient eats at least 50% of meals.   Thanks, Orlando Penner, RN, MSN, CDCES Diabetes Coordinator Inpatient Diabetes Program [phone] (Team Pager from 8am to 5pm)

## 2023-07-24 NOTE — Plan of Care (Signed)
  Problem: Education: Goal: Knowledge of General Education information will improve Description: Including pain rating scale, medication(s)/side effects and non-pharmacologic comfort measures Outcome: Adequate for Discharge   Problem: Health Behavior/Discharge Planning: Goal: Ability to manage health-related needs will improve Outcome: Adequate for Discharge   Problem: Clinical Measurements: Goal: Ability to maintain clinical measurements within normal limits will improve Outcome: Adequate for Discharge Goal: Will remain free from infection Outcome: Adequate for Discharge Goal: Diagnostic test results will improve Outcome: Adequate for Discharge Goal: Respiratory complications will improve Outcome: Adequate for Discharge Goal: Cardiovascular complication will be avoided Outcome: Adequate for Discharge   Problem: Activity: Goal: Risk for activity intolerance will decrease Outcome: Adequate for Discharge   Problem: Nutrition: Goal: Adequate nutrition will be maintained Outcome: Adequate for Discharge   Problem: Coping: Goal: Level of anxiety will decrease Outcome: Adequate for Discharge   Problem: Elimination: Goal: Will not experience complications related to bowel motility Outcome: Adequate for Discharge Goal: Will not experience complications related to urinary retention Outcome: Adequate for Discharge   Problem: Pain Managment: Goal: General experience of comfort will improve Outcome: Adequate for Discharge   Problem: Safety: Goal: Ability to remain free from injury will improve Outcome: Adequate for Discharge   Problem: Skin Integrity: Goal: Risk for impaired skin integrity will decrease Outcome: Adequate for Discharge   Problem: Fluid Volume: Goal: Hemodynamic stability will improve Outcome: Adequate for Discharge   Problem: Clinical Measurements: Goal: Diagnostic test results will improve Outcome: Adequate for Discharge Goal: Signs and symptoms of  infection will decrease Outcome: Adequate for Discharge   Problem: Respiratory: Goal: Ability to maintain adequate ventilation will improve Outcome: Adequate for Discharge   Problem: Education: Goal: Ability to describe self-care measures that may prevent or decrease complications (Diabetes Survival Skills Education) will improve Outcome: Adequate for Discharge Goal: Individualized Educational Video(s) Outcome: Adequate for Discharge   Problem: Coping: Goal: Ability to adjust to condition or change in health will improve Outcome: Adequate for Discharge   Problem: Fluid Volume: Goal: Ability to maintain a balanced intake and output will improve Outcome: Adequate for Discharge   Problem: Health Behavior/Discharge Planning: Goal: Ability to identify and utilize available resources and services will improve Outcome: Adequate for Discharge Goal: Ability to manage health-related needs will improve Outcome: Adequate for Discharge   Problem: Metabolic: Goal: Ability to maintain appropriate glucose levels will improve Outcome: Adequate for Discharge   Problem: Nutritional: Goal: Maintenance of adequate nutrition will improve Outcome: Adequate for Discharge Goal: Progress toward achieving an optimal weight will improve Outcome: Adequate for Discharge   Problem: Skin Integrity: Goal: Risk for impaired skin integrity will decrease Outcome: Adequate for Discharge   Problem: Tissue Perfusion: Goal: Adequacy of tissue perfusion will improve Outcome: Adequate for Discharge   Pt ao x4, respirations even and unlabored. Pt has all belongings. Pt has received all DC paperwork. Pt being taken home by family. Pt taken down to lobby with staff via wheelchair.

## 2023-07-24 NOTE — Progress Notes (Signed)
[facility]  ROUNDING NOTE   Subjective:   Roberto Ingram is a 72 year old male with past medical conditions including diabetes, GERD, bronchitis, CAD, BPH, hypertension, thyroid disease, and chronic kidney disease stage IIIb.  Patient presents to the emergency department complaining of fatigue and fevers.  Patient has been admitted for Dehydration [E86.0] Weakness [R53.1] Sepsis secondary to UTI (HCC) [A41.9, N39.0] Sepsis, due to unspecified organism, unspecified whether acute organ dysfunction present (HCC) [A41.9]  Renal function about the same today with creatinine of 2.2. Lymph node biopsy result from [facility] was obtained by hospitalist today.   Objective:  Vital signs in last 24 hours:  Temp:  [98 F (36.7 C)-99.4 F (37.4 C)] 99 F (37.2 C) (09/12 0752) Pulse Rate:  [64-73] 73 (09/12 0752) Resp:  [18-22] 22 (09/12 0752) BP: (130-139)/(57-70) 130/70 (09/12 0752) SpO2:  [94 %-100 %] 96 % (09/12 0752) Weight:  [100.2 kg] 100.2 kg (09/12 0500)  Weight change: 2.5 kg Filed Weights   07/22/23 0500 07/23/23 0416 07/24/23 0500  Weight: 99.8 kg 97.7 kg 100.2 kg    Intake/Output: I/O last 3 completed shifts: In: 960 [P.O.:960] Out: 1500 [Urine:1500]   Intake/Output this shift:  No intake/output data recorded.  Physical Exam: General: NAD  Head: Normocephalic, atraumatic.  Dry oral mucosal membranes  Eyes: Anicteric,  Lungs:  Clear to auscultation, normal effort  Heart: Regular rate and rhythm  Abdomen:  Soft, nontender,   Extremities: trace peripheral edema.  Neurologic: Nonfocal, moving all four extremities  Skin: No lesions  Access: None    Basic Metabolic Panel: Recent Labs  Lab 07/19/23 1016 07/20/23 1051 07/21/23 1011 07/22/23 1625 07/23/23 0545 07/24/23 0443  NA 128* 128*  --  132* 133* 132*  K 4.7 4.9  --  4.6 5.5* 4.7  CL 97* 97*  --  100 104 99  CO2 23 24  --  24 24 26   GLUCOSE 265* 203*  --  131* 292* 186*  BUN 30* 28*  --  22 26*  28*  CREATININE 2.80* 3.27*  --  2.56* 2.23* 2.21*  CALCIUM 7.8* 8.1*  --  8.0* 8.1* 8.2*  PHOS  --   --  2.1*  --   --   --     Liver Function Tests: Recent Labs  Lab 07/18/23 1616 07/19/23 1016  AST 18 17  ALT 16 16  ALKPHOS 60 58  BILITOT 0.6 0.5  PROT 7.3 6.8  ALBUMIN 2.6* 2.4*   Recent Labs  Lab 07/18/23 1616  LIPASE 42   No results for input(s): "AMMONIA" in the last 168 hours.  CBC: Recent Labs  Lab 07/18/23 1343 07/19/23 1016 07/24/23 0443  WBC 13.6* 12.9* 19.3*  NEUTROABS  --   --  15.6*  HGB 9.7* 8.9* 8.4*  HCT 31.2* 28.4* 26.0*  MCV 85.5 83.8 82.5  PLT 345 353 477*    Cardiac Enzymes: No results for input(s): "CKTOTAL", "CKMB", "CKMBINDEX", "TROPONINI" in the last 168 hours.  BNP: Invalid input(s): "POCBNP"  CBG: Recent Labs  Lab 07/23/23 1112 07/23/23 1705 07/23/23 2114 07/24/23 0754 07/24/23 1150  GLUCAP 316* 315* 241* 171* 166*    Microbiology: Results for orders placed or performed during the hospital encounter of 07/18/23  Blood culture (routine x 2)     Status: None   Collection Time: 07/18/23  1:46 PM   Specimen: BLOOD  Result Value Ref Range Status   Specimen Description BLOOD BLOOD LEFT ARM  Final   Special Requests  Final    BOTTLES DRAWN AEROBIC AND ANAEROBIC Blood Culture adequate volume   Culture   Final    NO GROWTH 5 DAYS Performed at [facility], [address] [place], [place] [address]    Report Status 07/23/2023 FINAL  Final  SARS Coronavirus 2 by RT PCR (hospital order, performed in [facility] hospital lab) *cepheid single result test* Anterior Nasal Swab     Status: None   Collection Time: 07/18/23  1:47 PM   Specimen: Anterior Nasal Swab  Result Value Ref Range Status   SARS Coronavirus 2 by RT PCR NEGATIVE NEGATIVE Final    Comment: (NOTE) SARS-CoV-2 target nucleic acids are NOT DETECTED.  The SARS-CoV-2 RNA is generally detectable in upper and lower respiratory specimens during the acute  phase of infection. The lowest concentration of SARS-CoV-2 viral copies this assay can detect is 250 copies / mL. A negative result does not preclude SARS-CoV-2 infection and should not be used as the sole basis for treatment or other patient management decisions.  A negative result may occur with improper specimen collection / handling, submission of specimen other than nasopharyngeal swab, presence of viral mutation(s) within the areas targeted by this assay, and inadequate number of viral copies (<250 copies / mL). A negative result must be combined with clinical observations, patient history, and epidemiological information.  Fact Sheet for Patients:   RoadLapTop.co.za  Fact Sheet for Healthcare Providers: http://kim-miller.com/  This test is not yet approved or  cleared by the [place] FDA and has been authorized for detection and/or diagnosis of SARS-CoV-2 by FDA under an Emergency Use Authorization (EUA).  This EUA will remain in effect (meaning this test can be used) for the duration of the COVID-19 declaration under Section 564(b)(1) of the Act, 21 U.S.C. section 360bbb-3(b)(1), unless the authorization is terminated or revoked sooner.  Performed at [facility], [address] [place], [place] [address]   Group A Strep by PCR [facility] Only)     Status: None   Collection Time: 07/18/23  4:16 PM   Specimen: Throat; Sterile Swab  Result Value Ref Range Status   Group A Strep by PCR NOT DETECTED NOT DETECTED Final    Comment: Performed at [facility], [address] [place], [place] [address]  Culture, blood (Routine X 2) w Reflex to ID Panel     Status: None   Collection Time: 07/19/23  1:09 PM   Specimen: BLOOD  Result Value Ref Range Status   Specimen Description BLOOD BLOOD RIGHT HAND  Final   Special Requests   Final    BOTTLES DRAWN AEROBIC AND ANAEROBIC Blood Culture results may not be optimal  due to an excessive volume of blood received in culture bottles   Culture   Final    NO GROWTH 5 DAYS Performed at [facility], [address] [place], [place] [address]    Report Status 07/24/2023 FINAL  Final  Respiratory (~20 pathogens) panel by PCR     Status: None   Collection Time: 07/20/23 10:30 AM   Specimen: Nasopharyngeal Swab; Respiratory  Result Value Ref Range Status   Adenovirus NOT DETECTED NOT DETECTED Final   Coronavirus 229E NOT DETECTED NOT DETECTED Final    Comment: (NOTE) The Coronavirus on the Respiratory Panel, DOES NOT test for the novel  Coronavirus (2019 nCoV)    Coronavirus HKU1 NOT DETECTED NOT DETECTED Final   Coronavirus NL63 NOT DETECTED NOT DETECTED Final   Coronavirus OC43  NOT DETECTED NOT DETECTED Final   Metapneumovirus NOT DETECTED NOT DETECTED Final   Rhinovirus / Enterovirus NOT DETECTED NOT DETECTED Final   Influenza A NOT DETECTED NOT DETECTED Final   Influenza B NOT DETECTED NOT DETECTED Final   Parainfluenza Virus 1 NOT DETECTED NOT DETECTED Final   Parainfluenza Virus 2 NOT DETECTED NOT DETECTED Final   Parainfluenza Virus 3 NOT DETECTED NOT DETECTED Final   Parainfluenza Virus 4 NOT DETECTED NOT DETECTED Final   Respiratory Syncytial Virus NOT DETECTED NOT DETECTED Final   Bordetella pertussis NOT DETECTED NOT DETECTED Final   Bordetella Parapertussis NOT DETECTED NOT DETECTED Final   Chlamydophila pneumoniae NOT DETECTED NOT DETECTED Final   Mycoplasma pneumoniae NOT DETECTED NOT DETECTED Final    Comment: Performed at [facility], [address], [place], [place] [address]  Urine Culture (for pregnant, neutropenic or urologic patients or patients with an indwelling urinary catheter)     Status: None   Collection Time: 07/20/23 11:28 AM   Specimen: Urine, Clean Catch  Result Value Ref Range Status   Specimen Description   Final    URINE, CLEAN CATCH Performed at [facility], [address],  [place], [place] [address]    Special Requests   Final    NONE Performed at [facility], [address], [place], [place] [address]    Culture   Final    NO GROWTH Performed at [facility], [address], [place], [place] [address]    Report Status 07/21/2023 FINAL  Final  Aerobic/Anaerobic Culture w Gram Stain (surgical/deep wound)     Status: None (Preliminary result)   Collection Time: 07/22/23  2:19 PM   Specimen: Path Tissue  Result Value Ref Range Status   Specimen Description   Final    TISSUE AXILLA LYMPH NODE Performed at [facility], [address] [place], [place] [address]    Special Requests   Final    NONE Performed at [facility], [address] [place], [place] [address]    Gram Stain NO WBC SEEN NO ORGANISMS SEEN   Final   Culture   Final    NO GROWTH 2 DAYS NO ANAEROBES ISOLATED; CULTURE IN PROGRESS FOR 5 DAYS Performed at [facility], [address], [place], [place] [address]    Report Status PENDING  Incomplete    Coagulation Studies: No results for input(s): "LABPROT", "INR" in the last 72 hours.   Urinalysis: No results for input(s): "COLORURINE", "LABSPEC", "PHURINE", "GLUCOSEU", "HGBUR", "BILIRUBINUR", "KETONESUR", "PROTEINUR", "UROBILINOGEN", "NITRITE", "LEUKOCYTESUR" in the last 72 hours.  Invalid input(s): "APPERANCEUR"     Imaging: No results found.   Medications:    sodium chloride 10 mL/hr at 07/22/23 1020    amLODipine  10 mg Oral Daily   ammonium lactate  1 Application Topical BID   aspirin  81 mg Oral Daily   bisacodyl  10 mg Oral Daily   cloNIDine  0.1 mg Oral BID   clopidogrel  75 mg Oral Daily   enoxaparin (LOVENOX) injection  40 mg Subcutaneous Q24H   feeding supplement (GLUCERNA SHAKE)  237 mL Oral TID BM   insulin aspart  0-5 Units Subcutaneous QHS   insulin aspart  0-9 Units Subcutaneous TID WC   insulin glargine-yfgn  15 Units Subcutaneous Daily   levothyroxine  50 mcg Oral  Q0600   LORazepam  0.5 mg Intravenous Once   metoprolol tartrate  50 mg  Oral BID   mometasone-formoterol  2 puff Inhalation BID   multivitamin with minerals  1 tablet Oral Daily   omega-3 acid ethyl esters  1 g Oral Daily   pantoprazole  40 mg Oral BID AC   rosuvastatin  40 mg Oral Daily   senna-docusate  1 tablet Oral BID   tamsulosin  0.4 mg Oral QPC supper   sodium chloride, acetaminophen, albuterol, ALPRAZolam, alum & mag hydroxide-simeth, colchicine, diphenhydrAMINE, fluticasone, guaiFENesin, meclizine, melatonin, nitroGLYCERIN, ondansetron **OR** ondansetron (ZOFRAN) IV, oxyCODONE, polyvinyl alcohol, traZODone  Assessment/ Plan:  Roberto Ingram is a 72 y.o.  male with past medical conditions including diabetes, GERD, bronchitis, CAD, BPH, hypertension, thyroid disease, and chronic kidney disease stage IIIb.  Patient presents to the emergency department complaining of fatigue and fevers.  Patient has been admitted for Dehydration [E86.0] Weakness [R53.1] Sepsis secondary to UTI (HCC) [A41.9, N39.0] Sepsis, due to unspecified organism, unspecified whether acute organ dysfunction present [facility]) [A41.9]   Acute Kidney Injury with hyponatremia on chronic kidney disease stage IIIb with baseline creatinine 1.8-2.0.  Recent lymph node biopsy completed at [facility] on 9//24, findings inconclusive. Chronic kidney disease is secondary to diabetes and hypertension.  CT chest/abdomen/pelvis negative for hydronephrosis.  Acute kidney injury likely has a component of dehydration due to poor oral intake.     Update: Renal function about the a.m. today.  Creatinine still remains slightly above baseline.  I have recommended to him that he continue to follow with his outpatient nephrologist Dr. Allena Katz upon discharge.  He is verbalized understanding.  Lab Results  Component Value Date   CREATININE 2.21 (H) 07/24/2023   CREATININE 2.23 (H) 07/23/2023   CREATININE 2.56 (H) 07/22/2023     Intake/Output Summary (Last 24 hours) at 07/24/2023 1450 Last data filed at 07/23/2023 2100 Gross per 24 hour  Intake 840 ml  Output --  Net 840 ml    2. Anemia of chronic kidney disease Lab Results  Component Value Date   HGB 8.4 (L) 07/24/2023    Consider Epogen as outpatient.  3. Diabetes mellitus type II with chronic kidney disease/renal manifestations: insulin dependent. Home regimen includes aspart and Lantus. Most recent hemoglobin A1c is 7.8 on 07/19/23.  Glucose management per primary team.  4.  Hypertension with chronic kidney disease.  Continue amlodipine, clonidine, and metoprolol upon discharge.  Blood pressure currently 130/70.   LOS: 6 Starlena Beil 9/12/20242:50 PM

## 2023-07-24 NOTE — Progress Notes (Addendum)
Physical Therapy Treatment Patient Details Name: Roberto Ingram MRN: [id number] DOB: 1951-01-28 Today's Date: 07/24/2023   History of Present Illness Patient is a 72 year old male with generalized weakness, sore throat, dry mouth, diffuse body aches. Sepsis with suspected UTI. 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    PT Comments  Pt presents laying in bed with nursing/family in room, 7/10 pain in L shoulder. Pt able to perform sit<>supine with supervision, sit<>stand RW/CGA, step pivot to recliner RW/CGA, and ambulate ~252ft RW/CGA. Pt continues to require cueing for hand placement and increased effort with bed mobility/transfers but exhibited improved efficiency of movement. 1 LOB occurred while ambulating when turning his head to talk to nursing but did not require therapist assist for recovery. Pt reported 8/10 RPE following today's session and would benefit from continued skilled therapy to maximize functional abilities and address deficits in activity tolerance.    If plan is discharge home, recommend the following: A little help with walking and/or transfers;A little help with bathing/dressing/bathroom   Can travel by private vehicle        Equipment Recommendations  BSC/3in1;Other (comment) (tub bench)    Recommendations for Other Services       Precautions / Restrictions Precautions Precautions: Fall Restrictions Weight Bearing Restrictions: No     Mobility  Bed Mobility Overal bed mobility: Needs Assistance Bed Mobility: Supine to Sit     Supine to sit: HOB elevated, Supervision, Used rails     General bed mobility comments: required cueing for sequencing/hand placement, improved efficiency/ smoothness of movement but still required increased effort to sit EOB    Transfers Overall transfer level: Needs assistance Equipment used: Rolling walker (2 wheels) Transfers: Sit to/from Stand, Bed to chair/wheelchair/BSC Sit to Stand: Contact guard  assist   Step pivot transfers: Contact guard assist       General transfer comment: verbal cueing for hand placement    Ambulation/Gait Ambulation/Gait assistance: Contact guard assist Gait Distance (Feet): 200 Feet Assistive device: Rolling walker (2 wheels)   Gait velocity: decreased     General Gait Details: some crossover stepping observed ~171ft, 1 LOB when turning head to talk to nursing but did not require therapist assist for recovery   Stairs             Wheelchair Mobility     Tilt Bed    Modified Rankin (Stroke Patients Only)       Balance                                            Cognition Arousal: Alert Behavior During Therapy: Flat affect, WFL for tasks assessed/performed Overall Cognitive Status: Impaired/Different from baseline Area of Impairment: Safety/judgement, Following commands                       Following Commands: Follows one step commands with increased time Safety/Judgement: Decreased awareness of deficits              Exercises General Exercises - Lower Extremity Gluteal Sets: Strengthening, Both, 15 reps Long Arc Quad: Strengthening, Both, 10 reps Hip Flexion/Marching: Strengthening, Both, 15 reps    General Comments        Pertinent Vitals/Pain Pain Assessment Pain Assessment: 0-10 Pain Score: 7  Pain Location: L shoulder Pain Descriptors / Indicators: Discomfort Pain Intervention(s): Monitored during session  Home Living                          Prior Function            PT Goals (current goals can now be found in the care plan section) Acute Rehab PT Goals Patient Stated Goal: to go home Progress towards PT goals: Progressing toward goals    Frequency    Min 1X/week      PT Plan      Co-evaluation              AM-PAC PT "6 Clicks" Mobility   Outcome Measure  Help needed turning from your back to your side while in a flat bed without  using bedrails?: A Little Help needed moving from lying on your back to sitting on the side of a flat bed without using bedrails?: A Little Help needed moving to and from a bed to a chair (including a wheelchair)?: A Little Help needed standing up from a chair using your arms (e.g., wheelchair or bedside chair)?: A Little Help needed to walk in hospital room?: A Little Help needed climbing 3-5 steps with a railing? : A Lot 6 Click Score: 17    End of Session Equipment Utilized During Treatment: Gait belt Activity Tolerance: Patient tolerated treatment well Patient left: with call bell/phone within reach;with family/visitor present;in chair;with chair alarm set   PT Visit Diagnosis: Unsteadiness on feet (R26.81);Muscle weakness (generalized) (M62.81)     Time: 0940-1010 PT Time Calculation (min) (ACUTE ONLY): 30 min  Charges:    $Therapeutic Exercise: 8-22 mins $Therapeutic Activity: 8-22 mins PT General Charges $$ ACUTE PT VISIT: 1 Visit                    Tonnia Bardin, PT, SPT 1:04 PM,07/24/23

## 2023-07-24 NOTE — Progress Notes (Signed)
OT Cancellation Note  Patient Details Name: Roberto Ingram MRN: [id number] DOB: 28-Jun-1951   Cancelled Treatment:    Reason Eval/Treat Not Completed: Other (comment) (Spoke with mobility team - pt declining participation in further therapy this date (was already seen by PT), secondary to medical news and would like time to process.)  Victorino Dike L. Bowen Kia, OTR/L  07/24/23, 2:53 PM

## 2023-07-25 LAB — SURGICAL PATHOLOGY

## 2023-07-27 LAB — AEROBIC/ANAEROBIC CULTURE W GRAM STAIN (SURGICAL/DEEP WOUND)
Culture: NO GROWTH
Gram Stain: NONE SEEN

## 2023-07-31 ENCOUNTER — Emergency Department: Payer: Medicare Other

## 2023-07-31 ENCOUNTER — Other Ambulatory Visit: Payer: Self-pay

## 2023-07-31 ENCOUNTER — Encounter: Payer: Self-pay | Admitting: Emergency Medicine

## 2023-07-31 ENCOUNTER — Inpatient Hospital Stay
Admission: EM | Admit: 2023-07-31 | Discharge: 2023-08-04 | DRG: 177 | Disposition: A | Discharge status: Home Health | Payer: Medicare Other | Attending: Internal Medicine | Admitting: Internal Medicine

## 2023-07-31 DIAGNOSIS — K5909 Other constipation: Secondary | ICD-10-CM | POA: Diagnosis present

## 2023-07-31 DIAGNOSIS — E782 Mixed hyperlipidemia: Secondary | ICD-10-CM | POA: Diagnosis present

## 2023-07-31 DIAGNOSIS — Z7951 Long term (current) use of inhaled steroids: Secondary | ICD-10-CM

## 2023-07-31 DIAGNOSIS — C49A2 Gastrointestinal stromal tumor of stomach: Secondary | ICD-10-CM | POA: Diagnosis present

## 2023-07-31 DIAGNOSIS — E785 Hyperlipidemia, unspecified: Secondary | ICD-10-CM

## 2023-07-31 DIAGNOSIS — I130 Hypertensive heart and chronic kidney disease with heart failure and stage 1 through stage 4 chronic kidney disease, or unspecified chronic kidney disease: Secondary | ICD-10-CM | POA: Diagnosis present

## 2023-07-31 DIAGNOSIS — J440 Chronic obstructive pulmonary disease with acute lower respiratory infection: Secondary | ICD-10-CM | POA: Diagnosis present

## 2023-07-31 DIAGNOSIS — Z9861 Coronary angioplasty status: Secondary | ICD-10-CM

## 2023-07-31 DIAGNOSIS — D75839 Thrombocytosis, unspecified: Secondary | ICD-10-CM | POA: Diagnosis present

## 2023-07-31 DIAGNOSIS — Z91041 Radiographic dye allergy status: Secondary | ICD-10-CM

## 2023-07-31 DIAGNOSIS — Z8261 Family history of arthritis: Secondary | ICD-10-CM

## 2023-07-31 DIAGNOSIS — I10 Essential (primary) hypertension: Secondary | ICD-10-CM | POA: Diagnosis present

## 2023-07-31 DIAGNOSIS — Z888 Allergy status to other drugs, medicaments and biological substances status: Secondary | ICD-10-CM

## 2023-07-31 DIAGNOSIS — Z882 Allergy status to sulfonamides status: Secondary | ICD-10-CM

## 2023-07-31 DIAGNOSIS — Z886 Allergy status to analgesic agent status: Secondary | ICD-10-CM

## 2023-07-31 DIAGNOSIS — Z833 Family history of diabetes mellitus: Secondary | ICD-10-CM

## 2023-07-31 DIAGNOSIS — E871 Hypo-osmolality and hyponatremia: Secondary | ICD-10-CM | POA: Diagnosis present

## 2023-07-31 DIAGNOSIS — I82612 Acute embolism and thrombosis of superficial veins of left upper extremity: Secondary | ICD-10-CM | POA: Diagnosis present

## 2023-07-31 DIAGNOSIS — U071 COVID-19: Principal | ICD-10-CM | POA: Diagnosis present

## 2023-07-31 DIAGNOSIS — D509 Iron deficiency anemia, unspecified: Secondary | ICD-10-CM | POA: Diagnosis present

## 2023-07-31 DIAGNOSIS — J189 Pneumonia, unspecified organism: Secondary | ICD-10-CM | POA: Diagnosis present

## 2023-07-31 DIAGNOSIS — Z79890 Hormone replacement therapy (postmenopausal): Secondary | ICD-10-CM

## 2023-07-31 DIAGNOSIS — Z841 Family history of disorders of kidney and ureter: Secondary | ICD-10-CM

## 2023-07-31 DIAGNOSIS — Z5986 Financial insecurity: Secondary | ICD-10-CM

## 2023-07-31 DIAGNOSIS — E1165 Type 2 diabetes mellitus with hyperglycemia: Secondary | ICD-10-CM | POA: Diagnosis present

## 2023-07-31 DIAGNOSIS — Z8673 Personal history of transient ischemic attack (TIA), and cerebral infarction without residual deficits: Secondary | ICD-10-CM

## 2023-07-31 DIAGNOSIS — R531 Weakness: Secondary | ICD-10-CM | POA: Diagnosis not present

## 2023-07-31 DIAGNOSIS — I82622 Acute embolism and thrombosis of deep veins of left upper extremity: Secondary | ICD-10-CM | POA: Diagnosis present

## 2023-07-31 DIAGNOSIS — Z825 Family history of asthma and other chronic lower respiratory diseases: Secondary | ICD-10-CM

## 2023-07-31 DIAGNOSIS — I2510 Atherosclerotic heart disease of native coronary artery without angina pectoris: Secondary | ICD-10-CM | POA: Diagnosis present

## 2023-07-31 DIAGNOSIS — I5033 Acute on chronic diastolic (congestive) heart failure: Secondary | ICD-10-CM | POA: Diagnosis present

## 2023-07-31 DIAGNOSIS — Z808 Family history of malignant neoplasm of other organs or systems: Secondary | ICD-10-CM

## 2023-07-31 DIAGNOSIS — Z87891 Personal history of nicotine dependence: Secondary | ICD-10-CM

## 2023-07-31 DIAGNOSIS — E039 Hypothyroidism, unspecified: Secondary | ICD-10-CM | POA: Diagnosis present

## 2023-07-31 DIAGNOSIS — Z794 Long term (current) use of insulin: Secondary | ICD-10-CM

## 2023-07-31 DIAGNOSIS — R262 Difficulty in walking, not elsewhere classified: Secondary | ICD-10-CM | POA: Diagnosis not present

## 2023-07-31 DIAGNOSIS — E11319 Type 2 diabetes mellitus with unspecified diabetic retinopathy without macular edema: Secondary | ICD-10-CM | POA: Diagnosis present

## 2023-07-31 DIAGNOSIS — E1122 Type 2 diabetes mellitus with diabetic chronic kidney disease: Secondary | ICD-10-CM | POA: Diagnosis present

## 2023-07-31 DIAGNOSIS — Z7902 Long term (current) use of antithrombotics/antiplatelets: Secondary | ICD-10-CM

## 2023-07-31 DIAGNOSIS — I252 Old myocardial infarction: Secondary | ICD-10-CM

## 2023-07-31 DIAGNOSIS — N400 Enlarged prostate without lower urinary tract symptoms: Secondary | ICD-10-CM | POA: Diagnosis present

## 2023-07-31 DIAGNOSIS — Z801 Family history of malignant neoplasm of trachea, bronchus and lung: Secondary | ICD-10-CM

## 2023-07-31 DIAGNOSIS — Z823 Family history of stroke: Secondary | ICD-10-CM

## 2023-07-31 DIAGNOSIS — G894 Chronic pain syndrome: Secondary | ICD-10-CM | POA: Diagnosis present

## 2023-07-31 DIAGNOSIS — N184 Chronic kidney disease, stage 4 (severe): Secondary | ICD-10-CM | POA: Diagnosis present

## 2023-07-31 DIAGNOSIS — R609 Edema, unspecified: Principal | ICD-10-CM

## 2023-07-31 DIAGNOSIS — Z951 Presence of aortocoronary bypass graft: Secondary | ICD-10-CM

## 2023-07-31 DIAGNOSIS — R21 Rash and other nonspecific skin eruption: Secondary | ICD-10-CM | POA: Diagnosis present

## 2023-07-31 DIAGNOSIS — Z7901 Long term (current) use of anticoagulants: Secondary | ICD-10-CM

## 2023-07-31 DIAGNOSIS — Z7982 Long term (current) use of aspirin: Secondary | ICD-10-CM

## 2023-07-31 DIAGNOSIS — Z8249 Family history of ischemic heart disease and other diseases of the circulatory system: Secondary | ICD-10-CM

## 2023-07-31 LAB — CBC WITH DIFFERENTIAL/PLATELET
Abs Immature Granulocytes: 0.23 10*3/uL — ABNORMAL HIGH (ref 0.00–0.07)
Basophils Absolute: 0.1 10*3/uL (ref 0.0–0.1)
Basophils Relative: 0 %
Eosinophils Absolute: 0 10*3/uL (ref 0.0–0.5)
Eosinophils Relative: 0 %
HCT: 28.5 % — ABNORMAL LOW (ref 39.0–52.0)
Hemoglobin: 8.9 g/dL — ABNORMAL LOW (ref 13.0–17.0)
Immature Granulocytes: 1 %
Lymphocytes Relative: 8 %
Lymphs Abs: 2.3 10*3/uL (ref 0.7–4.0)
MCH: 26.3 pg (ref 26.0–34.0)
MCHC: 31.2 g/dL (ref 30.0–36.0)
MCV: 84.3 fL (ref 80.0–100.0)
Monocytes Absolute: 1.5 10*3/uL — ABNORMAL HIGH (ref 0.1–1.0)
Monocytes Relative: 5 %
Neutro Abs: 24.4 10*3/uL — ABNORMAL HIGH (ref 1.7–7.7)
Neutrophils Relative %: 86 %
Platelets: 484 10*3/uL — ABNORMAL HIGH (ref 150–400)
RBC: 3.38 MIL/uL — ABNORMAL LOW (ref 4.22–5.81)
RDW: 17.7 % — ABNORMAL HIGH (ref 11.5–15.5)
WBC: 28.6 10*3/uL — ABNORMAL HIGH (ref 4.0–10.5)
nRBC: 0 % (ref 0.0–0.2)

## 2023-07-31 LAB — PROTIME-INR
INR: 1.4 — ABNORMAL HIGH (ref 0.8–1.2)
Prothrombin Time: 17.3 seconds — ABNORMAL HIGH (ref 11.4–15.2)

## 2023-07-31 LAB — COMPREHENSIVE METABOLIC PANEL
ALT: 16 U/L (ref 0–44)
AST: 26 U/L (ref 15–41)
Albumin: 2.4 g/dL — ABNORMAL LOW (ref 3.5–5.0)
Alkaline Phosphatase: 66 U/L (ref 38–126)
Anion gap: 9 (ref 5–15)
BUN: 33 mg/dL — ABNORMAL HIGH (ref 8–23)
CO2: 23 mmol/L (ref 22–32)
Calcium: 8.4 mg/dL — ABNORMAL LOW (ref 8.9–10.3)
Chloride: 95 mmol/L — ABNORMAL LOW (ref 98–111)
Creatinine, Ser: 2.2 mg/dL — ABNORMAL HIGH (ref 0.61–1.24)
GFR, Estimated: 31 mL/min — ABNORMAL LOW (ref >60)
Glucose, Bld: 156 mg/dL — ABNORMAL HIGH (ref 70–99)
Potassium: 4.8 mmol/L (ref 3.5–5.1)
Sodium: 127 mmol/L — ABNORMAL LOW (ref 135–145)
Total Bilirubin: 0.6 mg/dL (ref 0.3–1.2)
Total Protein: 7.5 g/dL (ref 6.5–8.1)

## 2023-07-31 LAB — TSH: TSH: 4.276 u[IU]/mL (ref 0.350–4.500)

## 2023-07-31 LAB — GLUCOSE, CAPILLARY
Glucose-Capillary: 165 mg/dL — ABNORMAL HIGH (ref 70–99)
Glucose-Capillary: 166 mg/dL — ABNORMAL HIGH (ref 70–99)

## 2023-07-31 LAB — SARS CORONAVIRUS 2 BY RT PCR: SARS Coronavirus 2 by RT PCR: POSITIVE — AB

## 2023-07-31 LAB — TROPONIN I (HIGH SENSITIVITY): Troponin I (High Sensitivity): 52 ng/L — ABNORMAL HIGH (ref <18)

## 2023-07-31 LAB — CBG MONITORING, ED: Glucose-Capillary: 119 mg/dL — ABNORMAL HIGH (ref 70–99)

## 2023-07-31 LAB — BRAIN NATRIURETIC PEPTIDE: B Natriuretic Peptide: 259.1 pg/mL — ABNORMAL HIGH (ref 0.0–100.0)

## 2023-07-31 MED ORDER — INSULIN GLARGINE-YFGN 100 UNIT/ML ~~LOC~~ SOLN
15.0000 [IU] | Freq: Every day | SUBCUTANEOUS | Status: DC
  Administered 2023-07-31 – 2023-08-04 (×5): 15 [IU] via SUBCUTANEOUS
  Filled 2023-07-31 (×5): qty 0.15

## 2023-07-31 MED ORDER — BISACODYL 10 MG RE SUPP
10.0000 mg | Freq: Once | RECTAL | Status: AC
  Administered 2023-07-31: 10 mg via RECTAL
  Filled 2023-07-31: qty 1

## 2023-07-31 MED ORDER — FERROUS SULFATE 325 (65 FE) MG PO TABS
325.0000 mg | ORAL_TABLET | Freq: Every day | ORAL | Status: DC
  Administered 2023-08-01 – 2023-08-04 (×4): 325 mg via ORAL
  Filled 2023-07-31 (×4): qty 1

## 2023-07-31 MED ORDER — LEVOFLOXACIN 500 MG PO TABS
500.0000 mg | ORAL_TABLET | ORAL | Status: AC
  Administered 2023-07-31: 500 mg via ORAL
  Filled 2023-07-31: qty 1

## 2023-07-31 MED ORDER — ALBUTEROL SULFATE (2.5 MG/3ML) 0.083% IN NEBU: 3.0000 mL | INHALATION_SOLUTION | Freq: Four times a day (QID) | RESPIRATORY_TRACT | Status: DC | PRN

## 2023-07-31 MED ORDER — FLUTICASONE PROPIONATE 50 MCG/ACT NA SUSP: 2.0000 | Freq: Every day | NASAL | Status: DC | PRN

## 2023-07-31 MED ORDER — ONDANSETRON HCL 4 MG/2ML IJ SOLN
4.0000 mg | Freq: Once | INTRAMUSCULAR | Status: AC
  Administered 2023-07-31: 4 mg via INTRAVENOUS
  Filled 2023-07-31: qty 2

## 2023-07-31 MED ORDER — MORPHINE SULFATE (PF) 4 MG/ML IV SOLN
4.0000 mg | INTRAVENOUS | Status: DC | PRN
  Administered 2023-07-31 (×2): 4 mg via INTRAVENOUS
  Filled 2023-07-31 (×2): qty 1

## 2023-07-31 MED ORDER — MOMETASONE FURO-FORMOTEROL FUM 200-5 MCG/ACT IN AERO
2.0000 | INHALATION_SPRAY | Freq: Two times a day (BID) | RESPIRATORY_TRACT | Status: DC
  Administered 2023-08-01 – 2023-08-04 (×6): 2 via RESPIRATORY_TRACT
  Filled 2023-07-31 (×2): qty 8.8

## 2023-07-31 MED ORDER — ENOXAPARIN SODIUM 100 MG/ML IJ SOSY
1.0000 mg/kg | PREFILLED_SYRINGE | Freq: Two times a day (BID) | INTRAMUSCULAR | Status: DC
  Administered 2023-07-31 – 2023-08-03 (×8): 100 mg via SUBCUTANEOUS
  Filled 2023-07-31 (×9): qty 1

## 2023-07-31 MED ORDER — PANTOPRAZOLE SODIUM 40 MG PO TBEC
40.0000 mg | DELAYED_RELEASE_TABLET | Freq: Two times a day (BID) | ORAL | Status: DC
  Administered 2023-07-31 – 2023-08-04 (×8): 40 mg via ORAL
  Filled 2023-07-31 (×8): qty 1

## 2023-07-31 MED ORDER — DIPHENHYDRAMINE HCL 25 MG PO CAPS: 25.0000 mg | ORAL_CAPSULE | Freq: Four times a day (QID) | ORAL | Status: DC | PRN

## 2023-07-31 MED ORDER — NIRMATRELVIR/RITONAVIR (PAXLOVID) TABLET (RENAL DOSING)
2.0000 | ORAL_TABLET | Freq: Two times a day (BID) | ORAL | Status: DC
  Administered 2023-08-01 – 2023-08-04 (×8): 2 via ORAL
  Filled 2023-07-31 (×2): qty 20

## 2023-07-31 MED ORDER — ONDANSETRON HCL 4 MG/2ML IJ SOLN
4.0000 mg | Freq: Four times a day (QID) | INTRAMUSCULAR | Status: DC | PRN
  Administered 2023-07-31: 4 mg via INTRAVENOUS
  Filled 2023-07-31: qty 2

## 2023-07-31 MED ORDER — INSULIN ASPART 100 UNIT/ML IJ SOLN
0.0000 [IU] | Freq: Every day | INTRAMUSCULAR | Status: DC
  Administered 2023-08-03: 2 [IU] via SUBCUTANEOUS
  Filled 2023-07-31: qty 1

## 2023-07-31 MED ORDER — ALPRAZOLAM 0.5 MG PO TABS
0.5000 mg | ORAL_TABLET | Freq: Every evening | ORAL | Status: DC | PRN
  Administered 2023-07-31: 0.5 mg via ORAL
  Filled 2023-07-31: qty 1

## 2023-07-31 MED ORDER — ALBUTEROL SULFATE HFA 108 (90 BASE) MCG/ACT IN AERS: 1.0000 | INHALATION_SPRAY | Freq: Four times a day (QID) | RESPIRATORY_TRACT | Status: DC | PRN

## 2023-07-31 MED ORDER — OXYCODONE HCL 5 MG PO TABS
5.0000 mg | ORAL_TABLET | ORAL | Status: DC | PRN
  Administered 2023-07-31 – 2023-08-04 (×8): 5 mg via ORAL
  Filled 2023-07-31 (×8): qty 1

## 2023-07-31 MED ORDER — INSULIN ASPART 100 UNIT/ML IJ SOLN
0.0000 [IU] | Freq: Three times a day (TID) | INTRAMUSCULAR | Status: DC
  Administered 2023-07-31: 2 [IU] via SUBCUTANEOUS
  Administered 2023-08-01 (×2): 1 [IU] via SUBCUTANEOUS
  Administered 2023-08-02: 2 [IU] via SUBCUTANEOUS
  Administered 2023-08-03: 3 [IU] via SUBCUTANEOUS
  Administered 2023-08-03 – 2023-08-04 (×2): 2 [IU] via SUBCUTANEOUS
  Administered 2023-08-04: 1 [IU] via SUBCUTANEOUS
  Filled 2023-07-31 (×8): qty 1

## 2023-07-31 MED ORDER — HEPARIN SODIUM (PORCINE) 5000 UNIT/ML IJ SOLN: 5000.0000 [IU] | Freq: Two times a day (BID) | INTRAMUSCULAR | Status: DC

## 2023-07-31 MED ORDER — LACTULOSE 10 GM/15ML PO SOLN
30.0000 g | Freq: Three times a day (TID) | ORAL | Status: AC
  Administered 2023-07-31 (×2): 30 g via ORAL
  Filled 2023-07-31 (×2): qty 60

## 2023-07-31 MED ORDER — CARVEDILOL 3.125 MG PO TABS
3.1250 mg | ORAL_TABLET | Freq: Two times a day (BID) | ORAL | Status: DC
  Administered 2023-07-31 – 2023-08-04 (×8): 3.125 mg via ORAL
  Filled 2023-07-31 (×9): qty 1

## 2023-07-31 MED ORDER — TAMSULOSIN HCL 0.4 MG PO CAPS
0.4000 mg | ORAL_CAPSULE | Freq: Every day | ORAL | Status: DC
  Administered 2023-07-31 – 2023-08-03 (×4): 0.4 mg via ORAL
  Filled 2023-07-31 (×4): qty 1

## 2023-07-31 MED ORDER — SENNOSIDES-DOCUSATE SODIUM 8.6-50 MG PO TABS
1.0000 | ORAL_TABLET | Freq: Two times a day (BID) | ORAL | Status: DC
  Administered 2023-07-31 – 2023-08-04 (×6): 1 via ORAL
  Filled 2023-07-31 (×6): qty 1

## 2023-07-31 MED ORDER — CLOPIDOGREL BISULFATE 75 MG PO TABS: 75.0000 mg | ORAL_TABLET | Freq: Every day | ORAL | Status: DC

## 2023-07-31 MED ORDER — ALLOPURINOL 100 MG PO TABS: 100.0000 mg | ORAL_TABLET | Freq: Every day | ORAL | Status: DC | PRN

## 2023-07-31 MED ORDER — LEVOTHYROXINE SODIUM 50 MCG PO TABS
50.0000 ug | ORAL_TABLET | Freq: Every day | ORAL | Status: DC
  Administered 2023-08-01 – 2023-08-04 (×4): 50 ug via ORAL
  Filled 2023-07-31 (×4): qty 1

## 2023-07-31 MED ORDER — BUMETANIDE 0.25 MG/ML IJ SOLN
0.5000 mg | Freq: Two times a day (BID) | INTRAMUSCULAR | Status: AC
  Administered 2023-07-31 (×2): 0.5 mg via INTRAVENOUS
  Filled 2023-07-31 (×2): qty 4

## 2023-07-31 MED ORDER — ACETAMINOPHEN 325 MG PO TABS: 650.0000 mg | ORAL_TABLET | Freq: Four times a day (QID) | ORAL | Status: DC | PRN

## 2023-07-31 MED ORDER — ASPIRIN 81 MG PO CHEW
81.0000 mg | CHEWABLE_TABLET | Freq: Every day | ORAL | Status: DC
  Administered 2023-07-31 – 2023-08-04 (×5): 81 mg via ORAL
  Filled 2023-07-31 (×5): qty 1

## 2023-07-31 MED ORDER — ONDANSETRON HCL 4 MG PO TABS: 4.0000 mg | ORAL_TABLET | Freq: Four times a day (QID) | ORAL | Status: DC | PRN

## 2023-07-31 MED ORDER — ROSUVASTATIN CALCIUM 20 MG PO TABS: 40.0000 mg | ORAL_TABLET | Freq: Every day | ORAL | Status: DC

## 2023-07-31 MED ORDER — HYDRALAZINE HCL 10 MG PO TABS
10.0000 mg | ORAL_TABLET | Freq: Three times a day (TID) | ORAL | Status: DC
  Administered 2023-07-31 – 2023-08-04 (×12): 10 mg via ORAL
  Filled 2023-07-31 (×13): qty 1

## 2023-07-31 MED ORDER — ISOSORBIDE MONONITRATE ER 30 MG PO TB24
30.0000 mg | ORAL_TABLET | Freq: Every day | ORAL | Status: DC
  Administered 2023-07-31 – 2023-08-04 (×4): 30 mg via ORAL
  Filled 2023-07-31 (×4): qty 1

## 2023-07-31 NOTE — Progress Notes (Signed)
Pharmacy Antibiotic Note  Roberto Ingram is a 72 y.o. male admitted on 07/31/2023 with pneumonia.  Pharmacy has been consulted for levofloxacin dosing. Patient initiated on levofloxacin 500 mg PO q48h outpatient on 9/13 for CAP.  Plan: Continue patient on PTA levofloxacin 500 mg PO q48h, last day 9/20     Temp (24hrs), Avg:98.3 F (36.8 C), Min:98 F (36.7 C), Max:98.5 F (36.9 C)  Recent Labs  Lab 07/31/23 0024  WBC 28.6*  CREATININE 2.20*    Estimated Creatinine Clearance: 36.6 mL/min (A) (by C-G formula based on SCr of 2.2 mg/dL (H)).    Allergies  Allergen Reactions   Antihistamines, Chlorpheniramine-Type Swelling    Prostat   Diphenhydramine Hcl Swelling   Fluorescein Dermatitis, Hives, Itching and Other (See Comments)   Iodinated Contrast Media Other (See Comments)   Lisinopril Swelling   Ezetimibe Hives and Other (See Comments)   Furosemide Swelling   Metformin Hives and Other (See Comments)   Metoprolol Itching   Metoprolol Tartrate Itching   Nsaids Other (See Comments)    Does take aspirin   Sulfa Antibiotics Hives and Other (See Comments)   Brilinta [Ticagrelor] Other (See Comments)    Headaches, off balance, no appetite   Diphenhydramine Hcl Other (See Comments)    "The more I take, it makes my prostate swell..."   Other Itching and Other (See Comments)    FA Dye Prostate swelling FA Dye  Prostate swelling    Antimicrobials this admission: 9/13 levofloxacin >>9/20  Dose adjustments this admission: N/A  Microbiology results: None  Thank you for allowing pharmacy to be a part of this patient's care.  Merryl Hacker, PharmD Clinical Pharmacist 07/31/2023 11:50 AM

## 2023-07-31 NOTE — H&P (Addendum)
History and Physical    Roberto Ingram VWU:[id number] DOB: [birth year] DOA: 07/31/2023  PCP: Erasmo Downer, NP (Confirm with patient/family/NH records and if not entered, this has to be entered at [facility] point of entry) Patient coming from: Home  I have personally briefly reviewed patient's old medical records in [facility]  Chief Complaint: Fever, feeling weak  HPI: Roberto Ingram is a 72 y.o. male with medical history significant of CAD status post CABGx7 in 2007, recently diagnosed anaplastic large cell lymphoma, GIST tumor, CKD stage IV, HTN, gout, IDDM, COPD, BPH, chronic pain syndrome and narcotic dependence, presented with worsening of generalized weakness.  Patient started to have a cough last Thursday, when he coughs up light yellowish sputum, went to see his doctor at [facility] who diagnosed him with CAP and was started on Levaquin Q 48 hours x 4 doses and patient received a third dose last night.  Since yesterday patient has become weaker and family checked his temperature was 99.8 compared to his baseline temperature lower 98.  Family also reported that the patient has not been able to get out of bed since yesterday and when he did he had to use walker, even so he could only walk a few steps and had to retire.  Since Monday patient also has been having poor intake loss of taste.  No diarrhea but constipated.  Patient denies any pain including chest pain or abdominal pain or leg pain.  Last Wednesday, patient underwent lymph node dissection left axillary, and pathology came back positive for anaplastic large cell lymphoma for which patient is scheduled to see oncology to discuss about initiating chemotherapy.  Patient however experienced worsening of left upper arm swelling after the procedure and gradually getting worse.  July this year, patient was hospitalized for GIST tumor resection.  Since then patient has developed worsening of bilateral lower extremity edema.  Due to worsening  of kidney function his diuresis was held after July admission.  Patient was seen by nephrology last week and appears that Dr. Drexel Iha decided to continue to hold patient's diuresis given his kidney function remains above baseline.  Family reported that the patient continue to gain weight and along with worsening of peripheral edema, he gained 3 pounds in last 5 days.  ED Course: Temperature 98.5, blood pressure 134/62 O2 saturation 99% on room air.  Blood work showed sodium 127, creatinine 2.2 compared to baseline 2.0-2.2, bicarb 23K 4.8 hemoglobin 8.9, image study positive left pre-COVID pain DVT, negative bilateral lower extremity DVT.  Patient was started on Lovenox twice daily.  Patient was evaluated by general surgeon in the ED.  Review of Systems: As per HPI otherwise 14 point review of systems negative.    Past Medical History:  Diagnosis Date   Anemia    Anxiety    Generalized anxiety disorder   Asthmatic bronchitis    Atopic dermatitis    BPH (benign prostatic hyperplasia)    Cardiovascular disease    Carotid disease, bilateral (HCC)    Colon polyp    Constipation    Coronary artery disease    Diabetes mellitus without complication (HCC)    Diabetic retinopathy (HCC)    GERD (gastroesophageal reflux disease)    Glaucoma of left eye    Gout    History of GI bleed    Hypercholesterolemia    Hypertension    Hypomagnesemia    Hypothyroidism    Insomnia    Mixed hyperlipidemia    Myocardial infarction (  HCC)    Pneumonia    Renal disorder    Renal insufficiency    Sleep apnea    wears oxygen at night (unable to tolerate CPAP)   Stroke [facility])    Stroke [facility])    Thyroid disease    Vitamin D deficiency     Past Surgical History:  Procedure Laterality Date   COLONOSCOPY     COLONOSCOPY WITH PROPOFOL N/A 02/24/2018   Procedure: COLONOSCOPY WITH PROPOFOL;  Surgeon: Toledo, Boykin Nearing, MD;  Location: [facility] ENDOSCOPY;  Service: Gastroenterology;  Laterality: N/A;    COLONOSCOPY WITH PROPOFOL N/A 08/06/2022   Procedure: COLONOSCOPY WITH PROPOFOL;  Surgeon: Regis Bill, MD;  Location: [facility] ENDOSCOPY;  Service: Gastroenterology;  Laterality: N/A;   CORONARY ANGIOPLASTY     CORONARY ARTERY BYPASS GRAFT     ESOPHAGOGASTRODUODENOSCOPY     EXPLORATION POST OPERATIVE OPEN HEART     EYE SURGERY     Lens eye surgery   INGUINAL LYMPH NODE BIOPSY Left 07/22/2023   Procedure: LEFT AXILLARY LYMPH NODE BIOPSY;  Surgeon: Carolan Shiver, MD;  Location: [facility] ORS;  Service: General;  Laterality: Left;   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)   07/2023   NASAL POLYP EXCISION  1994   POLYPECTOMY     Quadruple bypass       reports that he quit smoking about 30 years ago. His smoking use included cigarettes. He has never used smokeless tobacco. He reports that he does not drink alcohol and does not use drugs.  Allergies  Allergen Reactions   Antihistamines, Chlorpheniramine-Type Swelling    Prostat   Diphenhydramine Hcl Swelling   Fluorescein Dermatitis, Hives, Itching and Other (See Comments)   Iodinated Contrast Media Other (See Comments)   Lisinopril Swelling   Ezetimibe Hives and Other (See Comments)   Furosemide Swelling   Metformin Hives and Other (See Comments)   Metoprolol Itching   Metoprolol Tartrate Itching   Nsaids Other (See Comments)    Does take aspirin   Sulfa Antibiotics Hives and Other (See Comments)   Brilinta [Ticagrelor] Other (See Comments)    Headaches, off balance, no appetite   Diphenhydramine Hcl Other (See Comments)    "The more I take, it makes my prostate swell..."   Other Itching and Other (See Comments)    FA Dye Prostate swelling FA Dye  Prostate swelling    Family History  Problem Relation Age of Onset   Atrial fibrillation Mother    Heart disease Mother    Stroke Mother    Bell's palsy Mother    Kidney disease Mother    Cancer Mother    Diabetes Father    Arthritis  Father    Stroke Brother    Diabetes Brother    Lung cancer Brother    Heart attack Brother    Cancer Brother    Heart disease Sister    COPD Sister    Throat cancer Sister      Prior to Admission medications   Medication Sig Start Date End Date Taking? Authorizing Provider  acetaminophen (TYLENOL) 325 MG tablet Take 650 mg by mouth every 6 (six) hours as needed. 06/09/23  Yes [provider]  albuterol (VENTOLIN HFA) 108 (90 Base) MCG/ACT inhaler Inhale 2 puffs into the lungs every 6 (six) hours as needed for wheezing or shortness of breath. 07/24/23  Yes Jonah Blue, MD  allopurinol (ZYLOPRIM) 100 MG tablet Take 100 mg by mouth daily.   Yes [provider]  ALPRAZolam (XANAX) 0.5 MG tablet Take 0.5 mg by mouth at bedtime as needed for sleep.    Yes [provider]  amLODipine (NORVASC) 10 MG tablet Take 10 mg by mouth daily.   Yes [provider]  ammonium lactate (AMLACTIN) 12 % cream Apply 1 Application topically 2 (two) times daily. Apply to arms, legs, and body one to two times daily 06/12/23  Yes [provider]  ascorbic acid (VITAMIN C) 500 MG tablet Take 500 mg by mouth daily.   Yes [provider]  aspirin 81 MG chewable tablet Chew 81 mg by mouth daily.    Yes [provider]  cloNIDine (CATAPRES) 0.1 MG tablet Take 0.1 mg by mouth 2 (two) times daily. 07/09/23  Yes [provider]  clopidogrel (PLAVIX) 75 MG tablet Take 75 mg by mouth daily.   Yes [provider]  colchicine 0.6 MG tablet Take 0.6 mg by mouth as needed (gout flares). 05/13/23  Yes [provider]  diphenhydrAMINE (BENADRYL ALLERGY) 25 mg capsule Take 1 capsule (25 mg total) by mouth every 6 (six) hours as needed. 05/23/23  Yes Dionne Bucy, MD  fluticasone (FLONASE) 50 MCG/ACT nasal spray Place 2 sprays into both nostrils daily as needed for allergies or rhinitis.   Yes [provider]  fluticasone-salmeterol  (ADVAIR) 250-50 MCG/ACT AEPB Inhale 1 puff into the lungs 2 (two) times daily.   Yes [provider]  insulin aspart (NOVOLOG) 100 UNIT/ML injection Inject 16-18 Units into the skin 3 (three) times daily before meals. 16 units with breakfast and lunch 18 units with dinner   Yes [provider]  insulin glargine-yfgn (SEMGLEE) 100 UNIT/ML injection Inject 0.15 mLs (15 Units total) into the skin daily. Patient taking differently: Inject 68 Units into the skin at bedtime. 07/25/23  Yes Jonah Blue, MD  levofloxacin (LEVAQUIN) 750 MG tablet Take 750 mg by mouth every other day. 07/25/23 08/02/23 Yes [provider]  levothyroxine (SYNTHROID) 50 MCG tablet Take 50 mcg by mouth daily before breakfast.  01/12/14  Yes [provider]  metoprolol tartrate (LOPRESSOR) 50 MG tablet Take 50 mg by mouth 2 (two) times daily. 03/28/14  Yes [provider]  Multiple Vitamin (MULTIVITAMIN WITH MINERALS) TABS tablet Take 1 tablet by mouth daily. 07/25/23  Yes Jonah Blue, MD  nitroGLYCERIN (NITROSTAT) 0.4 MG SL tablet Place 0.4 mg under the tongue every 5 (five) minutes as needed for chest pain.    Yes [provider]  Omega-3 Fatty Acids (FISH OIL) 1000 MG CAPS Take 1,000 mg by mouth daily.    Yes [provider]  omeprazole (PRILOSEC) 40 MG capsule Take 40 mg by mouth daily. 07/21/23  Yes [provider]  oxyCODONE (OXY IR/ROXICODONE) 5 MG immediate release tablet Take 1 tablet (5 mg total) by mouth every 4 (four) hours as needed for breakthrough pain. 07/24/23  Yes Jonah Blue, MD  rosuvastatin (CRESTOR) 40 MG tablet Take 40 mg by mouth daily.  03/29/14  Yes [provider]  Semaglutide, 2 MG/DOSE, (OZEMPIC, 2 MG/DOSE,) 8 MG/3ML SOPN Inject 2 mg into the skin every Thursday.   Yes [provider]  senna-docusate (SENOKOT-S) 8.6-50 MG tablet Take 1 tablet by mouth 2 (two) times daily. 07/24/23  Yes Jonah Blue, MD   tamsulosin (FLOMAX) 0.4 MG CAPS capsule Take 0.4 mg by mouth daily after supper.    Yes [provider]  Vitamin D, Ergocalciferol, (DRISDOL) 50000 units CAPS capsule Take  50,000 Units by mouth every 30 (thirty) days. Takes on 1st of each month   Yes [provider]  feeding supplement, GLUCERNA SHAKE, (GLUCERNA SHAKE) LIQD Take 237 mLs by mouth 3 (three) times daily between meals. 07/24/23 08/23/23  Jonah Blue, MD  pantoprazole (PROTONIX) 40 MG tablet Take 40 mg by mouth 2 (two) times daily. Patient not taking: Reported on 07/31/2023 06/21/23   [provider]  mometasone (NASONEX) 50 MCG/ACT nasal spray Place into the nose.  09/11/16  [provider]    Physical Exam: Vitals:   07/31/23 0532 07/31/23 0639 07/31/23 0730 07/31/23 0900  BP: 135/64 126/63 (!) 143/65 136/61  Pulse: 84 84 82 80  Resp:  (!) 22 (!) 29 (!) 25  Temp: 98.5 F (36.9 C)     TempSrc: Oral     SpO2: 96% 99% 97% 98%    Constitutional: NAD, calm, comfortable Vitals:   07/31/23 0532 07/31/23 0639 07/31/23 0730 07/31/23 0900  BP: 135/64 126/63 (!) 143/65 136/61  Pulse: 84 84 82 80  Resp:  (!) 22 (!) 29 (!) 25  Temp: 98.5 F (36.9 C)     TempSrc: Oral     SpO2: 96% 99% 97% 98%   Eyes: PERRL, lids and conjunctivae normal ENMT: Mucous membranes are moist. Posterior pharynx clear of any exudate or lesions.Normal dentition.  Neck: normal, supple, no masses, no thyromegaly Respiratory: clear to auscultation bilaterally, no wheezing, no crackles. Normal respiratory effort. No accessory muscle use.  Cardiovascular: Regular rate and rhythm, no murmurs / rubs / gallops.  2+ pitting edema. 2+ pedal pulses. No carotid bruits.  Left upper arm swelling and tender Abdomen: no tenderness, no masses palpated. No hepatosplenomegaly. Bowel sounds positive.  Musculoskeletal: no clubbing / cyanosis. No joint deformity upper and lower extremities. Good ROM, no contractures. Normal muscle tone.   Skin: no rashes, lesions, ulcers. No induration Neurologic: CN 2-12 grossly intact. Sensation intact, DTR normal. Strength 5/5 in all 4.  Psychiatric: Normal judgment and insight. Alert and oriented x 3. Normal mood.     Labs on Admission: I have personally reviewed following labs and imaging studies  CBC: Recent Labs  Lab 07/31/23 0024  WBC 28.6*  NEUTROABS 24.4*  HGB 8.9*  HCT 28.5*  MCV 84.3  PLT 484*   Basic Metabolic Panel: Recent Labs  Lab 07/31/23 0024  NA 127*  K 4.8  CL 95*  CO2 23  GLUCOSE 156*  BUN 33*  CREATININE 2.20*  CALCIUM 8.4*   GFR: Estimated Creatinine Clearance: 36.6 mL/min (A) (by C-G formula based on SCr of 2.2 mg/dL (H)). Liver Function Tests: Recent Labs  Lab 07/31/23 0024  AST 26  ALT 16  ALKPHOS 66  BILITOT 0.6  PROT 7.5  ALBUMIN 2.4*   No results for input(s): "LIPASE", "AMYLASE" in the last 168 hours. No results for input(s): "AMMONIA" in the last 168 hours. Coagulation Profile: No results for input(s): "INR", "PROTIME" in the last 168 hours. Cardiac Enzymes: No results for input(s): "CKTOTAL", "CKMB", "CKMBINDEX", "TROPONINI" in the last 168 hours. BNP (last 3 results) No results for input(s): "PROBNP" in the last 8760 hours. HbA1C: No results for input(s): "HGBA1C" in the last 72 hours. CBG: No results for input(s): "GLUCAP" in the last 168 hours. Lipid Profile: No results for input(s): "CHOL", "HDL", "LDLCALC", "TRIG", "CHOLHDL", "LDLDIRECT" in the last 72 hours. Thyroid Function Tests: No results for input(s): "TSH", "T4TOTAL", "FREET4", "T3FREE", "THYROIDAB" in the last 72 hours. Anemia Panel: No results for  input(s): "VITAMINB12", "FOLATE", "FERRITIN", "TIBC", "IRON", "RETICCTPCT" in the last 72 hours. Urine analysis:    Component Value Date/Time   COLORURINE YELLOW (A) 07/18/2023 1616   APPEARANCEUR CLOUDY (A) 07/18/2023 1616   LABSPEC 1.015 07/18/2023 1616   PHURINE 5.0 07/18/2023 1616   GLUCOSEU NEGATIVE  07/18/2023 1616   HGBUR NEGATIVE 07/18/2023 1616   BILIRUBINUR NEGATIVE 07/18/2023 1616   KETONESUR NEGATIVE 07/18/2023 1616   PROTEINUR 100 (A) 07/18/2023 1616   NITRITE NEGATIVE 07/18/2023 1616   LEUKOCYTESUR NEGATIVE 07/18/2023 1616    Radiological Exams on Admission: US Venous Img Lower Bilateral  Result Date: 07/31/2023 CLINICAL DATA:  Swelling. EXAM: Bilateral LOWER EXTREMITY VENOUS DOPPLER ULTRASOUND TECHNIQUE: Gray-scale sonography with compression, as well as color and duplex ultrasound, were performed to evaluate the deep venous system(s) from the level of the common femoral vein through the popliteal and proximal calf veins. COMPARISON:  None Available. FINDINGS: VENOUS Normal compressibility of the common femoral, superficial femoral, and popliteal veins, as well as the visualized calf veins. Visualized portions of profunda femoral vein and great saphenous vein unremarkable. No filling defects to suggest DVT on grayscale or color Doppler imaging. Doppler waveforms show normal direction of venous flow, normal respiratory plasticity and response to augmentation. Limited views of the contralateral common femoral vein are unremarkable. OTHER None. Limitations: none IMPRESSION: No evidence of bilateral lower extremity DVT Electronically Signed   By: Karen Kays M.D.   On: 07/31/2023 10:42   US Venous Img Upper Uni Left  Result Date: 07/31/2023 CLINICAL DATA:  Swelling. EXAM: Left UPPER EXTREMITY VENOUS DOPPLER ULTRASOUND TECHNIQUE: Gray-scale sonography with graded compression, as well as color Doppler and duplex ultrasound were performed to evaluate the upper extremity deep venous system from the level of the subclavian vein and including the jugular, axillary, basilic, radial, ulnar and upper cephalic vein. Spectral Doppler was utilized to evaluate flow at rest and with distal augmentation maneuvers. COMPARISON:  Chest CT 07/18/2023 FINDINGS: Contralateral Subclavian Vein: Respiratory  phasicity is normal and symmetric with the symptomatic side. No evidence of thrombus. Normal compressibility. Internal Jugular Vein: No evidence of thrombus. Normal compressibility, respiratory phasicity and response to augmentation. Subclavian Vein: No evidence of thrombus. Normal compressibility, respiratory phasicity and response to augmentation. Axillary Vein: No evidence of thrombus. Normal compressibility, respiratory phasicity and response to augmentation. Cephalic Vein: Poor flow on Doppler.  No compression. Basilic Vein: Poor flow on color and spectral Doppler. No compression. Brachial Veins: Areas of thrombus in the brachial vein are seen with poor flow on color and spectral Doppler and poor compression. Radial Veins: No evidence of thrombus. Normal compressibility, respiratory phasicity and response to augmentation. Ulnar Veins: No evidence of thrombus. Normal compressibility, respiratory phasicity and response to augmentation. Venous Reflux:  None visualized. Other Findings: Known mass lesions again identified in the axillary region measuring up to 6.5 cm as seen on prior CT scan. Critical Value/emergent results were called by telephone at the time of interpretation on 07/31/2023 at 7:40 am to provider Willy Eddy , who verbally acknowledged these results. IMPRESSION: Areas of thrombus in the left brachial vein, basilic vein and cephalic vein. Mass lesions identified in the axillary region. Please correlate with the prior CT Electronically Signed   By: Karen Kays M.D.   On: 07/31/2023 10:41   DG Chest Portable 1 View  Result Date: 07/31/2023 CLINICAL DATA:  Shortness of breath, edema.  Evaluate for effusion. EXAM: PORTABLE CHEST 1 VIEW COMPARISON:  Chest radiograph 06/22/2023. CT chest/abdomen/pelvis 07/18/2023. FINDINGS:  Low lung volumes accentuate the pulmonary vasculature and cardiomediastinal silhouette. No consolidation or pulmonary edema. Unchanged left basilar atelectasis. New small left  pleural effusion. IMPRESSION: New small left pleural effusion. Electronically Signed   By: Orvan Falconer M.D.   On: 07/31/2023 08:27    EKG: None  Assessment/Plan Principal Problem:   Impaired ambulation Active Problems:   Essential hypertension   Arm DVT (deep venous thromboembolism), acute, left (HCC)   CKD (chronic kidney disease), stage IV (HCC)   COVID-19 virus infection  (please populate well all problems here in Problem List. (For example, if patient is on BP meds at home and you resume or decide to hold them, it is a problem that needs to be her. Same for CAD, COPD, HLD and so on)  Left brachial vein DVT -Provoked, secondary to recent left axillary lymph node biopsy -Plan to treat with systemic anticoagulation 3-56-month -On Lovenox twice daily, if CBC stable, expect to switch to Eliquis tomorrow  COVID infection -Symptoms onset less than 5 days and given patient also has history of lymphoma with significant immune compromised along with severity of COVID infection with new onset of fever despite on Levaquin to treat a concurrent pneumonia, decided to initiate Paxlovid -Discussed with pharmacy, will hold off Plavix and statin for now while patient is on Paxlovid  Acute on chronic HFpEF decompensation -Significant fluid overload, significant fluid buildup over the last week and weight gaining -Creatinine appears to close to baseline, discussed with family agreed with trial of IV Bumex x 2 doses and recheck kidney function tomorrow if stable, consider continue diuresis until patient seen by nephrology.  Hyponatremia -Hypervolemic, overloaded likely secondary to HF decompensation -Diuresis and reevaluate -Hyponatremia lab sent  CAP -No hypoxia -Tomorrow last dose of Levaquin  CKD stage IV -With anasarca and fluid overload, creatinine level close to baseline, Bumex x 2 today reevaluate kidney function tomorrow  HTN -Discontinue clonidine and amlodipine given the history  of CHF as well as worsening of peripheral edema -Change metoprolol to Coreg -Start hydralazine and Imdur regimen  IDDM -Continue Lantus and sliding scale  Deconditioning -PT evaluation  Chronic iron deficiency anemia -Likely secondary to GIST tumor which was resected July of this year -Start iron supplement  CAD -No acute concern, continue aspirin, hold off Plavix given the interaction of Plavix and Paxlovid -Beta-blocker and statin  BPH -Stable  COPD -No acute concern, continue as needed DuoNebs and LABA and ICS  Chronic constipation -Failed MiraLAX and Colace -Trial of 3 times daily lactulose and Dulcolax suppository  DVT prophylaxis: Lovenox twice daily Code Status: Full code Family Communication: Wife at bedside Disposition Plan: Expect less than 2 midnight hospital stay Consults called: None Admission status: MedSurg observation   Emeline General MD [facility] Pager [phone]  07/31/2023, 12:10 PM

## 2023-07-31 NOTE — Consult Note (Signed)
SURGICAL CONSULTATION NOTE   HISTORY OF PRESENT ILLNESS (HPI):  72 y.o. male presented to [facility] ED for evaluation of swelling of the lower extremities.   Patient with history of anaplastic large cell lymphoma.  He was to have an appointment with me today for follow-up of the wound before starting chemotherapy.  Due to the swelling of the lower extremities patient came to the ED for further evaluation.  Surgery was consulted for evaluation of left axillary wound due to missing appointment with me today and   PAST MEDICAL HISTORY (PMH):  Past Medical History:  Diagnosis Date   Anemia    Anxiety    Generalized anxiety disorder   Asthmatic bronchitis    Atopic dermatitis    BPH (benign prostatic hyperplasia)    Cardiovascular disease    Carotid disease, bilateral (HCC)    Colon polyp    Constipation    Coronary artery disease    Diabetes mellitus without complication (HCC)    Diabetic retinopathy (HCC)    GERD (gastroesophageal reflux disease)    Glaucoma of left eye    Gout    History of GI bleed    Hypercholesterolemia    Hypertension    Hypomagnesemia    Hypothyroidism    Insomnia    Mixed hyperlipidemia    Myocardial infarction (HCC)    Pneumonia    Renal disorder    Renal insufficiency    Sleep apnea    wears oxygen at night (unable to tolerate CPAP)   Stroke (HCC)    Stroke [facility])    Thyroid disease    Vitamin D deficiency      PAST SURGICAL HISTORY (PSH):  Past Surgical History:  Procedure Laterality Date   COLONOSCOPY     COLONOSCOPY WITH PROPOFOL N/A 02/24/2018   Procedure: COLONOSCOPY WITH PROPOFOL;  Surgeon: Toledo, Boykin Nearing, MD;  Location: [facility] ENDOSCOPY;  Service: Gastroenterology;  Laterality: N/A;   COLONOSCOPY WITH PROPOFOL N/A 08/06/2022   Procedure: COLONOSCOPY WITH PROPOFOL;  Surgeon: Regis Bill, MD;  Location: [facility] ENDOSCOPY;  Service: Gastroenterology;  Laterality: N/A;   CORONARY ANGIOPLASTY     CORONARY ARTERY BYPASS GRAFT      ESOPHAGOGASTRODUODENOSCOPY     EXPLORATION POST OPERATIVE OPEN HEART     EYE SURGERY     Lens eye surgery   INGUINAL LYMPH NODE BIOPSY Left 07/22/2023   Procedure: LEFT AXILLARY LYMPH NODE BIOPSY;  Surgeon: Carolan Shiver, MD;  Location: [facility] ORS;  Service: General;  Laterality: Left;   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 Authorizing Provider  acetaminophen (TYLENOL) 325 MG tablet Take 650 mg by mouth every 6 (six) hours as needed. 06/09/23   [provider]  albuterol (VENTOLIN HFA) 108 (90 Base) MCG/ACT inhaler Inhale 2 puffs into the lungs every 6 (six) hours as needed for wheezing or shortness of breath. 07/24/23   Jonah Blue, MD  allopurinol (ZYLOPRIM) 100 MG tablet Take 100 mg by mouth daily as needed (gout).    [provider]  ALPRAZolam Prudy Feeler) 0.5 MG tablet Take 0.5 mg by mouth at bedtime as needed for sleep.     [provider]  amLODipine (NORVASC) 10 MG tablet Take 10 mg by mouth daily.    [provider]  ammonium lactate (AMLACTIN) 12 % cream Apply 1 Application topically 2 (two) times daily. Apply to arms, legs, and body one to two times daily 06/12/23   [provider]  ascorbic acid (VITAMIN C) 500 MG tablet Take 500 mg by mouth daily.    [provider]  aspirin 81 MG chewable tablet Chew 81 mg by mouth daily.     [provider]  cloNIDine (CATAPRES) 0.1 MG tablet Take 0.1 mg by mouth 2 (two) times daily. 07/09/23   [provider]  clopidogrel (PLAVIX) 75 MG tablet Take 75 mg by mouth daily.    [provider]  colchicine 0.6 MG tablet Take 0.6 mg by mouth as needed (gout flares). 05/13/23   [provider]  diphenhydrAMINE (BENADRYL ALLERGY) 25 mg capsule Take 1 capsule (25  mg total) by mouth every 6 (six) hours as needed. 05/23/23   Dionne Bucy, MD  feeding supplement, GLUCERNA SHAKE, (GLUCERNA SHAKE) LIQD Take 237 mLs by mouth 3 (three) times daily between meals. 07/24/23 08/23/23  Jonah Blue, MD  fluticasone (FLONASE) 50 MCG/ACT nasal spray Place 2 sprays into both nostrils daily as needed for allergies or rhinitis.    [provider]  fluticasone-salmeterol (ADVAIR) 250-50 MCG/ACT AEPB Inhale 1 puff into the lungs 2 (two) times daily.    [provider]  insulin aspart (NOVOLOG) 100 UNIT/ML injection Inject 16-18 Units into the skin 3 (three) times daily before meals. 16 units with breakfast and lunch 18 units with dinner    [provider]  insulin glargine-yfgn (SEMGLEE) 100 UNIT/ML injection Inject 0.15 mLs (15 Units total) into the skin daily. 07/25/23   Jonah Blue, MD  levothyroxine (SYNTHROID) 50 MCG tablet Take 50 mcg by mouth daily before breakfast.  01/12/14   [provider]  metoprolol tartrate (LOPRESSOR) 25 MG tablet Take 50 mg by mouth 2 (two) times daily. 03/28/14   [provider]  Multiple Vitamin (MULTIVITAMIN WITH MINERALS) TABS tablet Take 1 tablet by mouth daily. 07/25/23   Jonah Blue, MD  nitroGLYCERIN (NITROSTAT) 0.4 MG SL tablet Place 0.4 mg under the tongue every 5 (five) minutes as needed for chest pain.     [provider]  Omega-3 Fatty Acids (FISH OIL) 1000 MG CAPS Take 1,000 mg by mouth daily.     [provider]  oxyCODONE (OXY IR/ROXICODONE) 5 MG immediate release tablet Take 1 tablet (5 mg total) by mouth every 4 (four) hours as needed for breakthrough pain. 07/24/23   Jonah Blue, MD  pantoprazole (PROTONIX) 40 MG tablet Take 40 mg by mouth 2 (two) times daily. 06/21/23   [provider]  rosuvastatin (CRESTOR) 40 MG tablet Take 40 mg by mouth daily.  03/29/14   [provider]  Semaglutide, 2 MG/DOSE, (OZEMPIC, 2 MG/DOSE,) 8 MG/3ML SOPN  Inject 2 mg into the skin every Thursday.    [provider]  senna-docusate (SENOKOT-S) 8.6-50 MG tablet Take 1 tablet by mouth 2 (two) times daily. 07/24/23   Jonah Blue, MD  tamsulosin (FLOMAX) 0.4 MG CAPS capsule Take 0.4 mg by mouth daily after supper.     [provider]  Vitamin D, Ergocalciferol, (DRISDOL) 50000 units CAPS capsule Take 50,000 Units by mouth every 30 (thirty) days. Takes on 1st of each month    [provider]  mometasone (NASONEX) 50 MCG/ACT nasal spray Place into the nose.  09/11/16  [provider]     ALLERGIES:  Allergies  Allergen Reactions  Antihistamines, Chlorpheniramine-Type Swelling    Prostat   Diphenhydramine Hcl Swelling   Fluorescein Dermatitis, Hives, Itching and Other (See Comments)   Iodinated Contrast Media Other (See Comments)   Lisinopril Swelling   Ezetimibe Hives and Other (See Comments)   Furosemide Swelling   Metformin Hives and Other (See Comments)   Metoprolol Itching   Metoprolol Tartrate Itching   Nsaids Other (See Comments)    Does take aspirin   Sulfa Antibiotics Hives and Other (See Comments)   Brilinta [Ticagrelor] Other (See Comments)    Headaches, off balance, no appetite   Diphenhydramine Hcl Other (See Comments)    "The more I take, it makes my prostate swell..."   Other Itching and Other (See Comments)    FA Dye Prostate swelling FA Dye  Prostate swelling     SOCIAL HISTORY:  Social History   Socioeconomic History   Marital status: Married    Spouse name: Sheralyn Boatman   Number of children: 3   Years of education: 12   Highest education level: High school graduate  Occupational History   Occupation: retired  Tobacco Use   Smoking status: Former    Current packs/day: 0.00    Types: Cigarettes    Quit date: 06/11/1993    Years since quitting: 30.1   Smokeless tobacco: Never  Vaping Use   Vaping status: Never Used  Substance and Sexual Activity   Alcohol use: No   Drug  use: No   Sexual activity: Not on file    Comment: Married   Other Topics Concern   Not on file  Social History Narrative   Not on file   Social Determinants of Health   Financial Resource Strain: Medium Risk (06/10/2023)   Received from [facility]   Overall Financial Resource Strain (CARDIA)    Difficulty of Paying Living Expenses: Somewhat hard  Food Insecurity: No Food Insecurity (07/18/2023)   Hunger Vital Sign    Worried About Running Out of Food in the Last Year: Never true    Ran Out of Food in the Last Year: Never true  Transportation Needs: No Transportation Needs (07/18/2023)   PRAPARE - Administrator, Civil Service (Medical): No    Lack of Transportation (Non-Medical): No  Physical Activity: Insufficiently Active (03/04/2018)   Exercise Vital Sign    Days of Exercise per Week: 2 days    Minutes of Exercise per Session: 10 min  Stress: No Stress Concern Present (03/04/2018)   [employer] of Occupational Health - Occupational Stress Questionnaire    Feeling of Stress : Only a little  Social Connections: Socially Integrated (03/04/2018)   Social Connection and Isolation Panel [NHANES]    Frequency of Communication with Friends and Family: More than three times a week    Frequency of Social Gatherings with Friends and Family: Once a week    Attends Religious Services: More than 4 times per year    Active Member of [employer] or Organizations: Yes    Attends Engineer, structural: More than 4 times per year    Marital Status: Married  Catering manager Violence: Not At Risk (03/04/2018)   Humiliation, Afraid, Rape, and Kick questionnaire    Fear of Current or Ex-Partner: No    Emotionally Abused: No    Physically Abused: No    Sexually Abused: No      FAMILY HISTORY:  Family History  Problem Relation Age of Onset   Atrial fibrillation Mother  Heart disease Mother    Stroke Mother    Bell's palsy Mother    Kidney disease Mother     Cancer Mother    Diabetes Father    Arthritis Father    Stroke Brother    Diabetes Brother    Lung cancer Brother    Heart attack Brother    Cancer Brother    Heart disease Sister    COPD Sister    Throat cancer Sister      VITAL SIGNS:  Temp:  [98 F (36.7 C)-98.5 F (36.9 C)] 98.5 F (36.9 C) (09/19 0532) Pulse Rate:  [75-84] 82 (09/19 0730) Resp:  [18-29] 29 (09/19 0730) BP: (126-143)/(62-65) 143/65 (09/19 0730) SpO2:  [96 %-100 %] 97 % (09/19 0730)             PHYSICAL EXAM:  Constitutional:  -- Awake, alert, and oriented x3  -- Extremities: B/L UE and LE FROM, hands and feet warm, with bilateral lower extremity edema more on the left than on the right edema  --Left axillary wound healing well.  Small expected seroma without any complications.    Labs:     Latest Ref Rng & Units 07/31/2023   12:24 AM 07/24/2023    4:43 AM 07/19/2023   10:16 AM  CBC  WBC 4.0 - 10.5 K/uL 28.6  19.3  12.9   Hemoglobin 13.0 - 17.0 g/dL 8.9  8.4  8.9   Hematocrit 39.0 - 52.0 % 28.5  26.0  28.4   Platelets 150 - 400 K/uL 484  477  353       Latest Ref Rng & Units 07/31/2023   12:24 AM 07/24/2023    4:43 AM 07/23/2023    5:45 AM  CMP  Glucose 70 - 99 mg/dL 161  096  045   BUN 8 - 23 mg/dL 33  28  26   Creatinine 0.61 - 1.24 mg/dL 4.09  8.11  9.14   Sodium 135 - 145 mmol/L 127  132  133   Potassium 3.5 - 5.1 mmol/L 4.8  4.7  5.5   Chloride 98 - 111 mmol/L 95  99  104   CO2 22 - 32 mmol/L 23  26  24    Calcium 8.9 - 10.3 mg/dL 8.4  8.2  8.1   Total Protein 6.5 - 8.1 g/dL 7.5     Total Bilirubin 0.3 - 1.2 mg/dL 0.6     Alkaline Phos 38 - 126 U/L 66     AST 15 - 41 U/L 26     ALT 0 - 44 U/L 16       Assessment/Plan:  72 y.o. male with anaplastic large cell lymphoma  He is medical oncology requested treating for surgical standpoint to start chemotherapy regarding the healing of the left axillary wound.  The left axillary wound is well-healed.  No contraindication to start  chemotherapy from the left axillary wound standpoint  Continue further workup of lower extremity edema as per ED physician.   Gae Gallop, MD

## 2023-07-31 NOTE — ED Triage Notes (Signed)
Pt presents ambulatory to triage via POV with complaints of leg swelling that started several days ago, +1 pitting edema in his feet and legs. Rates the pain 7.5/10. He notes taking Oxycodone PTA with no improvement in his sx. A&Ox4 at this time. Denies CP or SOB.

## 2023-07-31 NOTE — ED Provider Notes (Signed)
[facility] Provider Note    Event Date/Time   First MD Initiated Contact with Patient 07/31/23 [phone]     (approximate)   History   Leg Swelling   HPI  Roberto Ingram is a 72 y.o. male with recent diagnosis of anaplastic lymphoma not currently on chemotherapy presents to the ER for evaluation of worsening swelling diffuse pain.  Had a recent lymph node biopsy.  Does have scheduled outpatient follow-up with oncology next week but waiting for clearance from surgery regarding initiating chemotherapy.  Is having diffuse pain and discomfort trying to manage with OxyContin at home.  Feels like the swelling is getting worse also having some shortness of breath.     Physical Exam   Triage Vital Signs: ED Triage Vitals  Encounter Vitals Group     BP 07/31/23 0026 134/62     Systolic BP Percentile --      Diastolic BP Percentile --      Pulse Rate 07/31/23 0026 75     Resp 07/31/23 0026 18     Temp 07/31/23 0026 98 F (36.7 C)     Temp Source 07/31/23 0026 Oral     SpO2 07/31/23 0026 100 %     Weight --      Height --      Head Circumference --      Peak Flow --      Pain Score 07/31/23 0021 7     Pain Loc --      Pain Education --      Exclude from Growth Chart --     Most recent vital signs: Vitals:   07/31/23 0730 07/31/23 0900  BP: (!) 143/65 136/61  Pulse: 82 80  Resp: (!) 29 (!) 25  Temp:    SpO2: 97% 98%     Constitutional: Alert  Eyes: Conjunctivae are normal.  Head: Atraumatic. Nose: No congestion/rhinnorhea. Mouth/Throat: Mucous membranes are moist.   Neck: Painless ROM.  Cardiovascular:   Good peripheral circulation. Respiratory: Normal respiratory effort.  No retractions.  Gastrointestinal: Soft and nontender.  Musculoskeletal:  no deformity, ble edema Neurologic:  MAE spontaneously. No gross focal neurologic deficits are appreciated.  Skin:  Skin is warm, dry and intact. No rash noted. Psychiatric: Mood and affect are  normal. Speech and behavior are normal.    ED Results / Procedures / Treatments   Labs (all labs ordered are listed, but only abnormal results are displayed) Labs Reviewed  SARS CORONAVIRUS 2 BY RT PCR - Abnormal; Notable for the following components:      Result Value   SARS Coronavirus 2 by RT PCR POSITIVE (*)    All other components within normal limits  CBC WITH DIFFERENTIAL/PLATELET - Abnormal; Notable for the following components:   WBC 28.6 (*)    RBC 3.38 (*)    Hemoglobin 8.9 (*)    HCT 28.5 (*)    RDW 17.7 (*)    Platelets 484 (*)    Neutro Abs 24.4 (*)    Monocytes Absolute 1.5 (*)    Abs Immature Granulocytes 0.23 (*)    All other components within normal limits  COMPREHENSIVE METABOLIC PANEL - Abnormal; Notable for the following components:   Sodium 127 (*)    Chloride 95 (*)    Glucose, Bld 156 (*)    BUN 33 (*)    Creatinine, Ser 2.20 (*)    Calcium 8.4 (*)    Albumin 2.4 (*)  GFR, Estimated 31 (*)    All other components within normal limits  BRAIN NATRIURETIC PEPTIDE - Abnormal; Notable for the following components:   B Natriuretic Peptide 259.1 (*)    All other components within normal limits  TROPONIN I (HIGH SENSITIVITY) - Abnormal; Notable for the following components:   Troponin I (High Sensitivity) 52 (*)    All other components within normal limits      RADIOLOGY Please see ED Course for my review and interpretation.  I personally reviewed all radiographic images ordered to evaluate for the above acute complaints and reviewed radiology reports and findings.  These findings were personally discussed with the patient.  Please see medical record for radiology report.    PROCEDURES:  Critical Care performed: No  Procedures   MEDICATIONS ORDERED IN ED: Medications  morphine (PF) 4 MG/ML injection 4 mg (4 mg Intravenous Given 07/31/23 0735)  ondansetron (ZOFRAN) injection 4 mg (4 mg Intravenous Given 07/31/23 0720)     IMPRESSION /  MDM / ASSESSMENT AND PLAN / ED COURSE  I reviewed the triage vital signs and the nursing notes.                              Differential diagnosis includes, but is not limited to, anasarca, lymphoma, malignancy, electrolyte abnormality, CHF, DVT  Patient presenting to the ER for evaluation of symptoms as described above.  Based on symptoms, risk factors and considered above differential, this presenting complaint could reflect a potentially life-threatening illness therefore the patient will be placed on continuous pulse oximetry and telemetry for monitoring.  Laboratory evaluation will be sent to evaluate for the above complaints.   Clinical Course as of 07/31/23 1054  Thu Jul 31, 2023  0806 I discussed the case in consultation with Dr. Maia Plan as the patient is a needing surgical clearance prior to initiation of chemotherapy.  He is currently agreed to see patient down in the ER to make recommendations. [PR]  [phone] Patient has been evaluated by Dr. Maia Plan who states the patient would be cleared for initiation of chemotherapy from his standpoint. [PR]  0920 Patient did test positive for COVID which could explain why he is having decreased p.o. intake shortness of breath cough. [PR]  1043 ReSound does show evidence of a left upper extremity DVT.  Will order Lovenox.  Patient significantly symptomatic unable to ambulate on his own now.  Will consult hospitalist for admission. [PR]    Clinical Course User Index [PR] Willy Eddy, MD     FINAL CLINICAL IMPRESSION(S) / ED DIAGNOSES   Final diagnoses:  Edema, unspecified type  Acute deep vein thrombosis (DVT) of brachial vein of left upper extremity (HCC)     Rx / DC Orders   ED Discharge Orders     None        Note:  This document was prepared using Dragon voice recognition software and may include unintentional dictation errors.    Willy Eddy, MD 07/31/23 1054

## 2023-07-31 NOTE — Progress Notes (Signed)
ANTICOAGULATION CONSULT NOTE - Initial Consult  Pharmacy Consult for Enoxaparin Indication: DVT  Allergies  Allergen Reactions   Antihistamines, Chlorpheniramine-Type Swelling    Prostat   Diphenhydramine Hcl Swelling   Fluorescein Dermatitis, Hives, Itching and Other (See Comments)   Iodinated Contrast Media Other (See Comments)   Lisinopril Swelling   Ezetimibe Hives and Other (See Comments)   Furosemide Swelling   Metformin Hives and Other (See Comments)   Metoprolol Itching   Metoprolol Tartrate Itching   Nsaids Other (See Comments)    Does take aspirin   Sulfa Antibiotics Hives and Other (See Comments)   Brilinta [Ticagrelor] Other (See Comments)    Headaches, off balance, no appetite   Diphenhydramine Hcl Other (See Comments)    "The more I take, it makes my prostate swell..."   Other Itching and Other (See Comments)    FA Dye Prostate swelling FA Dye  Prostate swelling      Vital Signs: Temp: 98.5 F (36.9 C) (09/19 0532) Temp Source: Oral (09/19 0532) BP: 136/61 (09/19 0900) Pulse Rate: 80 (09/19 0900)  Labs: Recent Labs    07/31/23 0024  HGB 8.9*  HCT 28.5*  PLT 484*  CREATININE 2.20*  TROPONINIHS 52*    Estimated Creatinine Clearance: 36.6 mL/min (A) (by C-G formula based on SCr of 2.2 mg/dL (H)).   Medical History: Past Medical History:  Diagnosis Date   Anemia    Anxiety    Generalized anxiety disorder   Asthmatic bronchitis    Atopic dermatitis    BPH (benign prostatic hyperplasia)    Cardiovascular disease    Carotid disease, bilateral (HCC)    Colon polyp    Constipation    Coronary artery disease    Diabetes mellitus without complication (HCC)    Diabetic retinopathy (HCC)    GERD (gastroesophageal reflux disease)    Glaucoma of left eye    Gout    History of GI bleed    Hypercholesterolemia    Hypertension    Hypomagnesemia    Hypothyroidism    Insomnia    Mixed hyperlipidemia    Myocardial infarction (HCC)     Pneumonia    Renal disorder    Renal insufficiency    Sleep apnea    wears oxygen at night (unable to tolerate CPAP)   Stroke (HCC)    Stroke [facility])    Thyroid disease    Vitamin D deficiency     Assessment: Patient is a 72 year old male with a recent diagnosis of anaplastic lymphoma not currently on chemotherapy diagnosed with a VTE. Pharmacy has been consulted to dose enoxaparin.   Goal of Therapy:  Anti-Xa level 0.6-1 units/ml 4hrs after LMWH dose given Monitor platelets by anticoagulation protocol: Yes   Plan:  Will initiate patient on enoxaparin 100 mg (1 mg/kg) SQ q12h Monitor CBC q72h per protocol  Anti-Xa level monitoring not indicated for this patient   Merryl Hacker, PharmD Clinical Pharmacist 07/31/2023,10:54 AM

## 2023-08-01 DIAGNOSIS — I5033 Acute on chronic diastolic (congestive) heart failure: Secondary | ICD-10-CM | POA: Diagnosis present

## 2023-08-01 DIAGNOSIS — I2510 Atherosclerotic heart disease of native coronary artery without angina pectoris: Secondary | ICD-10-CM | POA: Diagnosis present

## 2023-08-01 DIAGNOSIS — E11319 Type 2 diabetes mellitus with unspecified diabetic retinopathy without macular edema: Secondary | ICD-10-CM | POA: Diagnosis present

## 2023-08-01 DIAGNOSIS — E782 Mixed hyperlipidemia: Secondary | ICD-10-CM | POA: Diagnosis present

## 2023-08-01 DIAGNOSIS — D75839 Thrombocytosis, unspecified: Secondary | ICD-10-CM | POA: Diagnosis present

## 2023-08-01 DIAGNOSIS — D509 Iron deficiency anemia, unspecified: Secondary | ICD-10-CM | POA: Diagnosis present

## 2023-08-01 DIAGNOSIS — Z794 Long term (current) use of insulin: Secondary | ICD-10-CM | POA: Diagnosis not present

## 2023-08-01 DIAGNOSIS — I82612 Acute embolism and thrombosis of superficial veins of left upper extremity: Secondary | ICD-10-CM | POA: Diagnosis present

## 2023-08-01 DIAGNOSIS — E1122 Type 2 diabetes mellitus with diabetic chronic kidney disease: Secondary | ICD-10-CM | POA: Diagnosis present

## 2023-08-01 DIAGNOSIS — Z8249 Family history of ischemic heart disease and other diseases of the circulatory system: Secondary | ICD-10-CM | POA: Diagnosis not present

## 2023-08-01 DIAGNOSIS — U071 COVID-19: Secondary | ICD-10-CM | POA: Diagnosis present

## 2023-08-01 DIAGNOSIS — I82622 Acute embolism and thrombosis of deep veins of left upper extremity: Secondary | ICD-10-CM | POA: Diagnosis present

## 2023-08-01 DIAGNOSIS — N184 Chronic kidney disease, stage 4 (severe): Secondary | ICD-10-CM | POA: Diagnosis present

## 2023-08-01 DIAGNOSIS — N400 Enlarged prostate without lower urinary tract symptoms: Secondary | ICD-10-CM | POA: Diagnosis present

## 2023-08-01 DIAGNOSIS — Z79890 Hormone replacement therapy (postmenopausal): Secondary | ICD-10-CM | POA: Diagnosis not present

## 2023-08-01 DIAGNOSIS — C49A2 Gastrointestinal stromal tumor of stomach: Secondary | ICD-10-CM | POA: Diagnosis present

## 2023-08-01 DIAGNOSIS — Z7901 Long term (current) use of anticoagulants: Secondary | ICD-10-CM | POA: Diagnosis not present

## 2023-08-01 DIAGNOSIS — E039 Hypothyroidism, unspecified: Secondary | ICD-10-CM | POA: Diagnosis present

## 2023-08-01 DIAGNOSIS — I130 Hypertensive heart and chronic kidney disease with heart failure and stage 1 through stage 4 chronic kidney disease, or unspecified chronic kidney disease: Secondary | ICD-10-CM | POA: Diagnosis present

## 2023-08-01 DIAGNOSIS — J189 Pneumonia, unspecified organism: Secondary | ICD-10-CM | POA: Diagnosis present

## 2023-08-01 DIAGNOSIS — E1165 Type 2 diabetes mellitus with hyperglycemia: Secondary | ICD-10-CM | POA: Diagnosis present

## 2023-08-01 DIAGNOSIS — R531 Weakness: Secondary | ICD-10-CM | POA: Diagnosis present

## 2023-08-01 DIAGNOSIS — J440 Chronic obstructive pulmonary disease with acute lower respiratory infection: Secondary | ICD-10-CM | POA: Diagnosis present

## 2023-08-01 DIAGNOSIS — E871 Hypo-osmolality and hyponatremia: Secondary | ICD-10-CM | POA: Diagnosis present

## 2023-08-01 DIAGNOSIS — G894 Chronic pain syndrome: Secondary | ICD-10-CM | POA: Diagnosis present

## 2023-08-01 LAB — GLUCOSE, CAPILLARY
Glucose-Capillary: 130 mg/dL — ABNORMAL HIGH (ref 70–99)
Glucose-Capillary: 136 mg/dL — ABNORMAL HIGH (ref 70–99)
Glucose-Capillary: 91 mg/dL (ref 70–99)
Glucose-Capillary: 125 mg/dL — ABNORMAL HIGH (ref 70–99)

## 2023-08-01 LAB — CBC
HCT: 25.5 % — ABNORMAL LOW (ref 39.0–52.0)
Hemoglobin: 8.1 g/dL — ABNORMAL LOW (ref 13.0–17.0)
MCH: 26.5 pg (ref 26.0–34.0)
MCHC: 31.8 g/dL (ref 30.0–36.0)
MCV: 83.3 fL (ref 80.0–100.0)
Platelets: 449 10*3/uL — ABNORMAL HIGH (ref 150–400)
RBC: 3.06 MIL/uL — ABNORMAL LOW (ref 4.22–5.81)
RDW: 17.9 % — ABNORMAL HIGH (ref 11.5–15.5)
WBC: 25.2 10*3/uL — ABNORMAL HIGH (ref 4.0–10.5)
nRBC: 0 % (ref 0.0–0.2)

## 2023-08-01 LAB — OSMOLALITY: Osmolality: 292 mOsm/kg (ref 275–295)

## 2023-08-01 LAB — OSMOLALITY, URINE: Osmolality, Ur: 329 mOsm/kg (ref 300–900)

## 2023-08-01 LAB — BASIC METABOLIC PANEL
Anion gap: 10 (ref 5–15)
BUN: 33 mg/dL — ABNORMAL HIGH (ref 8–23)
CO2: 25 mmol/L (ref 22–32)
Calcium: 8.4 mg/dL — ABNORMAL LOW (ref 8.9–10.3)
Chloride: 97 mmol/L — ABNORMAL LOW (ref 98–111)
Creatinine, Ser: 2.34 mg/dL — ABNORMAL HIGH (ref 0.61–1.24)
GFR, Estimated: 29 mL/min — ABNORMAL LOW (ref >60)
Glucose, Bld: 134 mg/dL — ABNORMAL HIGH (ref 70–99)
Potassium: 4.5 mmol/L (ref 3.5–5.1)
Sodium: 132 mmol/L — ABNORMAL LOW (ref 135–145)

## 2023-08-01 LAB — SODIUM, URINE, RANDOM: Sodium, Ur: 36 mmol/L

## 2023-08-01 MED ORDER — AMMONIUM LACTATE 12 % EX LOTN
TOPICAL_LOTION | CUTANEOUS | Status: DC | PRN
  Filled 2023-08-01: qty 400

## 2023-08-01 MED ORDER — POLYVINYL ALCOHOL 1.4 % OP SOLN
1.0000 [drp] | OPHTHALMIC | Status: DC | PRN
  Administered 2023-08-01 – 2023-08-02 (×2): 1 [drp] via OPHTHALMIC
  Filled 2023-08-01: qty 15

## 2023-08-01 NOTE — Evaluation (Signed)
Physical Therapy Evaluation Patient Details Name: Roberto Ingram MRN: [id number] DOB: [birth year] Today's Date: 08/01/2023  History of Present Illness  Pt is a 72 y.o. male presenting to hospital 07/31/23 with c/o leg swelling, diffuse pain, and some SOB.  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  Clinical Impression  Prior to hospital admission, pt was ambulatory with RW; lives with his wife.  Currently pt is SBA with bed mobility; SBA with transfers; and CGA to ambulate with RW in hospital room.  Increased R lateral sway noted during ambulation (pt reports h/o this).  Pt would currently benefit from skilled PT to address noted impairments and functional limitations (see below for any additional details).  Upon hospital discharge, pt would benefit from ongoing therapy.     If plan is discharge home, recommend the following: A little help with walking and/or transfers;A little help with bathing/dressing/bathroom;Assistance with cooking/housework;Assist for transportation;Help with stairs or ramp for entrance   Can travel by private vehicle    Yes    Equipment Recommendations    Recommendations for Other Services       Functional Status Assessment Patient has had a recent decline in their functional status and demonstrates the ability to make significant improvements in function in a reasonable and predictable amount of time.     Precautions / Restrictions Precautions Precautions: Fall Restrictions Weight Bearing Restrictions: No      Mobility  Bed Mobility Overal bed mobility: Needs Assistance Bed Mobility: Supine to Sit     Supine to sit: Supervision, HOB elevated, Used rails Sit to supine: Supervision, HOB elevated   General bed mobility comments:  mild increased effort/time to perform on own    Transfers Overall transfer level: Needs assistance Equipment used: Rolling walker (2 wheels) Transfers: Sit to/from Stand Sit to Stand: Supervision           General transfer comment: x8 trials standing from bed; vc's for UE placement    Ambulation/Gait Ambulation/Gait assistance: Contact guard assist Gait Distance (Feet): 32 Feet Assistive device: Rolling walker (2 wheels) Gait Pattern/deviations: Step-through pattern, Decreased step length - right, Decreased step length - left Gait velocity: decreased     General Gait Details: increased R lateral sway noted (CGA provided for safety)  Stairs            Wheelchair Mobility     Tilt Bed    Modified Rankin (Stroke Patients Only)       Balance Overall balance assessment: Needs assistance Sitting-balance support: No upper extremity supported, Feet supported Sitting balance-Leahy Scale: Good Sitting balance - Comments: steady reaching within BOS   Standing balance support: During functional activity, Bilateral upper extremity supported, Reliant on assistive device for balance Standing balance-Leahy Scale: Fair Standing balance comment: steady static standing (no UE support)                             Pertinent Vitals/Pain Pain Assessment Pain Assessment: 0-10 Pain Score: 5  Pain Location: L shoulder Pain Descriptors / Indicators: Discomfort, Aching Pain Intervention(s): Limited activity within patient's tolerance, Monitored during session, Repositioned (RN notified) HR 85-98 bpm and SpO2 sats 99% or greater on 2 L O2 via nasal cannula during sessions activities.    Home Living Family/patient expects to be discharged  to:: Private residence Living Arrangements: Spouse/significant other Available Help at Discharge: Family;Available 24 hours/day Type of Home: House Home Access: Stairs to enter;Ramped entrance Entrance Stairs-Rails:  Right;Left Entrance Stairs-Number of Steps: 7-8 steps from front with B railings; ramp in back   Home Layout: One level Home Equipment: Agricultural consultant (2 wheels);BSC/3in1      Prior Function Prior Level of Function : Independent/Modified Independent;History of Falls (last six months)             Mobility Comments: Pt has been modified independent to supervision ambulating with RW last few weeks (since recent medical concerns). ADLs Comments: Per OT eval "Pt reports being Ind in self care but if needing some assistance wife is available to assist"     Extremity/Trunk Assessment   Upper Extremity Assessment Upper Extremity Assessment: Defer to OT evaluation LUE Deficits / Details: Per OT eval "increased edema and pain from prior lymph node procedure with decreasesd shoulder elevation to ~ 100 degrees"    Lower Extremity Assessment Lower Extremity Assessment: Generalized weakness       Communication   Communication Communication: No apparent difficulties Cueing Techniques: Verbal cues  Cognition Arousal: Alert Behavior During Therapy: Flat affect, WFL for tasks assessed/performed Overall Cognitive Status: Impaired/Different from baseline                                 General Comments: Increased time to process information        General Comments  Pt agreeable to PT session.  Pt's wife present during session.    Exercises     Assessment/Plan    PT Assessment Patient needs continued PT services  PT Problem List Decreased strength;Decreased activity tolerance;Decreased balance;Decreased mobility;Decreased knowledge of precautions;Pain       PT Treatment Interventions DME instruction;Gait training;Stair training;Functional mobility training;Therapeutic activities;Balance training;Patient/family education;Therapeutic exercise    PT Goals (Current goals can be found in the Care Plan section)  Acute Rehab PT Goals Patient Stated Goal: to go home PT  Goal Formulation: With patient/family Time For Goal Achievement: 08/15/23 Potential to Achieve Goals: Good    Frequency Min 1X/week     Co-evaluation               AM-PAC PT "6 Clicks" Mobility  Outcome Measure Help needed turning from your back to your side while in a flat bed without using bedrails?: A Little Help needed moving from lying on your back to sitting on the side of a flat bed without using bedrails?: A Little Help needed moving to and from a bed to a chair (including a wheelchair)?: A Little Help needed standing up from a chair using your arms (e.g., wheelchair or bedside chair)?: A Little Help needed to walk in hospital room?: A Little Help needed climbing 3-5 steps with a railing? : A Little 6 Click Score: 18    End of Session Equipment Utilized During Treatment: Gait belt Activity Tolerance: Patient tolerated treatment well Patient left: in bed;with call bell/phone within reach;with bed alarm set;with family/visitor present Nurse Communication: Mobility status;Precautions;Other (comment) (Pt's pain status) PT Visit Diagnosis: Unsteadiness on feet (R26.81);Muscle weakness (generalized) (M62.81);Other abnormalities of gait and mobility (R26.89);Pain Pain - Right/Left: Left Pain - part of body: Shoulder    Time: 1411-1436 PT Time Calculation (min) (ACUTE ONLY): 25 min   Charges:   PT Evaluation $PT Eval Low Complexity: 1 Low PT Treatments $Therapeutic Activity: 8-22 mins PT General Charges $$  ACUTE PT VISIT: 1 Visit        Hendricks Limes, PT 08/01/23, 3:17 PM

## 2023-08-01 NOTE — Progress Notes (Signed)
PROGRESS NOTE    Roberto Ingram  BMW:[id number] DOB: 1951/11/03 DOA: 07/31/2023 PCP: Erasmo Downer, NP    Assessment & Plan:   Principal Problem:   Impaired ambulation Active Problems:   Essential hypertension   Arm DVT (deep venous thromboembolism), acute, left (HCC)   CKD (chronic kidney disease), stage IV (HCC)   COVID-19 virus infection  Assessment and Plan: Left brachial vein DVT: continue on lovenox and will switch to eliquis and treat for 3-6 months. Recent left axillary lymph node biopsy    COVID infection: continue on paxlovid. Continue on airborne & contact precautions. Hx of lymphoma.    Acute on chronic diastolic CHF: holding bumex as Cr is trending up. Monitor I/Os.    Hyponatremia: trending up from day prior. In setting of hypervolemic from CHF exacerbation    CAP: continue on levaquin, bronchodilators & encourage incentive spirometry    CKDIV: Cr is trending up from day prior. Avoid nephrotoxic meds  HTN: continue on coreg, imdur, hydralazine. D/c clonidine, amlodipine w/ hx of CHF & worsening peripheral edema    DM2: likely poorly controlled. Continue on glargine, SSI w/ accuchecks   Deconditioning: PT/OT consulted     Chronic iron deficiency anemia: continue on iron supplement    Hx of CAD: continue on aspirin, coreg. Holding plavix, statin while on paxlovid.   BPH: continue on flomax   COPD: w/o exacerbation. Bronchodilators prn    Chronic constipation: continue on lactulose, dulcolax       DVT prophylaxis: lovenox  Code Status: full  Family Communication: discussed pt's care w/ pt's wife at bedside and answered her questions  Disposition Plan: depends on PT/OT recs  Level of care: Med-Surg Status is: Inpatient Remains inpatient appropriate because: severity of illness     Consultants:    Procedures:   Antimicrobials:   Subjective: Pt c/o malaise & shoulder pain   Objective: Vitals:   07/31/23 1500 07/31/23 1710  07/31/23 2203 07/31/23 2348  BP: (!) 115/57 136/63 138/63 (!) 130/59  Pulse: 84 (!) 110 97 90  Resp: (!) 21 17  18   Temp:  99.1 F (37.3 C)  98.6 F (37 C)  TempSrc:      SpO2: 97% 99% 99% 94%   No intake or output data in the 24 hours ending 08/01/23 0825 There were no vitals filed for this visit.  Examination:  General exam: Appears calm but uncomfortable  Respiratory system: diminished breath sounds b/l Cardiovascular system: S1 & S2+. No rubs, gallops or clicks. Gastrointestinal system: Abdomen is nondistended, soft and nontender. Normal bowel sounds heard. Central nervous system: Alert and oriented. Moves all extremities Psychiatry: Judgement and insight appear normal. Flat mood and affect      Data Reviewed: I have personally reviewed following labs and imaging studies  CBC: Recent Labs  Lab 07/31/23 0024 08/01/23 0441  WBC 28.6* 25.2*  NEUTROABS 24.4*  --   HGB 8.9* 8.1*  HCT 28.5* 25.5*  MCV 84.3 83.3  PLT 484* 449*   Basic Metabolic Panel: Recent Labs  Lab 07/31/23 0024 08/01/23 0441  NA 127* 132*  K 4.8 4.5  CL 95* 97*  CO2 23 25  GLUCOSE 156* 134*  BUN 33* 33*  CREATININE 2.20* 2.34*  CALCIUM 8.4* 8.4*   GFR: Estimated Creatinine Clearance: 34.4 mL/min (A) (by C-G formula based on SCr of 2.34 mg/dL (H)). Liver Function Tests: Recent Labs  Lab 07/31/23 0024  AST 26  ALT 16  ALKPHOS 66  BILITOT 0.6  PROT 7.5  ALBUMIN 2.4*   No results for input(s): "LIPASE", "AMYLASE" in the last 168 hours. No results for input(s): "AMMONIA" in the last 168 hours. Coagulation Profile: Recent Labs  Lab 07/31/23 1523  INR 1.4*   Cardiac Enzymes: No results for input(s): "CKTOTAL", "CKMB", "CKMBINDEX", "TROPONINI" in the last 168 hours. BNP (last 3 results) No results for input(s): "PROBNP" in the last 8760 hours. HbA1C: No results for input(s): "HGBA1C" in the last 72 hours. CBG: Recent Labs  Lab 07/31/23 1237 07/31/23 1711 07/31/23 2107  08/01/23 0822  GLUCAP 119* 165* 166* 130*   Lipid Profile: No results for input(s): "CHOL", "HDL", "LDLCALC", "TRIG", "CHOLHDL", "LDLDIRECT" in the last 72 hours. Thyroid Function Tests: Recent Labs    07/31/23 0044  TSH 4.276   Anemia Panel: No results for input(s): "VITAMINB12", "FOLATE", "FERRITIN", "TIBC", "IRON", "RETICCTPCT" in the last 72 hours. Sepsis Labs: No results for input(s): "PROCALCITON", "LATICACIDVEN" in the last 168 hours.  Recent Results (from the past 240 hour(s))  Fungus Culture With Stain     Status: None (Preliminary result)   Collection Time: 07/22/23  2:19 PM   Specimen: Path Tissue  Result Value Ref Range Status   Fungus Stain Final report  Final    Comment: (NOTE) Performed At: [facility] [address] [place], [place] [id number] Jolene Schimke MD ZD:[id number]    Fungus (Mycology) Culture PENDING  Incomplete   Fungal Source TISSUE  Final    Comment: Performed at [facility], [address] [place], [place] [address]  Aerobic/Anaerobic Culture w Gram Stain (surgical/deep wound)     Status: None   Collection Time: 07/22/23  2:19 PM   Specimen: Path Tissue  Result Value Ref Range Status   Specimen Description   Final    TISSUE AXILLA LYMPH NODE Performed at [facility], [address] [place], [place] [address]    Special Requests   Final    NONE Performed at [facility], [address] [place], [place] [address]    Gram Stain NO WBC SEEN NO ORGANISMS SEEN   Final   Culture   Final    No growth aerobically or anaerobically. Performed at [facility], [address] [place], [place] [address]    Report Status 07/27/2023 FINAL  Final  Acid Fast Smear (AFB)     Status: None   Collection Time: 07/22/23  2:19 PM   Specimen: Path Tissue  Result Value Ref Range Status   AFB Specimen Processing Comment  Final    Comment: Tissue Grinding and Digestion/Decontamination   Acid Fast Smear Negative   Final    Comment: (NOTE) Performed At: [facility] [address] [place], [place] [id number] Jolene Schimke MD UR:[id number]    Source (AFB) TISSUE  Final    Comment: Performed at [facility], [address] [place], [place] [address]  Fungus Culture Result     Status: None   Collection Time: 07/22/23  2:19 PM  Result Value Ref Range Status   Result 1 Comment  Final    Comment: (NOTE) KOH/Calcofluor preparation:  no fungus observed. Performed At: [facility] [address] [place], [place] [id number] Jolene Schimke MD GG:[id number]   SARS Coronavirus 2 by RT PCR (hospital order, performed in [facility] hospital lab) *cepheid single result test* Anterior Nasal Swab     Status: Abnormal   Collection Time: 07/31/23  7:39 AM   Specimen: Anterior Nasal Swab  Result  Value Ref Range Status   SARS Coronavirus 2 by RT PCR POSITIVE (A) NEGATIVE Final    Comment: (NOTE) SARS-CoV-2 target nucleic acids are DETECTED  SARS-CoV-2 RNA is generally detectable in upper respiratory specimens  during the acute phase of infection.  Positive results are indicative  of the presence of the identified virus, but do not rule out bacterial infection or co-infection with other pathogens not detected by the test.  Clinical correlation with patient history and  other diagnostic information is necessary to determine patient infection status.  The expected result is negative.  Fact Sheet for Patients:   RoadLapTop.co.za   Fact Sheet for Healthcare Providers:   http://kim-miller.com/    This test is not yet approved or cleared by the [place] FDA and  has been authorized for detection and/or diagnosis of SARS-CoV-2 by FDA under an Emergency Use Authorization (EUA).  This EUA will remain in effect (meaning this test can be used) for the duration of  the COVID-19 declaration under Section 564(b)(1)  of the Act, 21 U.S.C.  section 360-bbb-3(b)(1), unless the authorization is terminated or revoked sooner.   Performed at [facility], [address] [place], [place] [address]          Radiology Studies: US Venous Img Lower Bilateral  Result Date: 07/31/2023 CLINICAL DATA:  Swelling. EXAM: Bilateral LOWER EXTREMITY VENOUS DOPPLER ULTRASOUND TECHNIQUE: Gray-scale sonography with compression, as well as color and duplex ultrasound, were performed to evaluate the deep venous system(s) from the level of the common femoral vein through the popliteal and proximal calf veins. COMPARISON:  None Available. FINDINGS: VENOUS Normal compressibility of the common femoral, superficial femoral, and popliteal veins, as well as the visualized calf veins. Visualized portions of profunda femoral vein and great saphenous vein unremarkable. No filling defects to suggest DVT on grayscale or color Doppler imaging. Doppler waveforms show normal direction of venous flow, normal respiratory plasticity and response to augmentation. Limited views of the contralateral common femoral vein are unremarkable. OTHER None. Limitations: none IMPRESSION: No evidence of bilateral lower extremity DVT Electronically Signed   By: Karen Kays M.D.   On: 07/31/2023 10:42   US Venous Img Upper Uni Left  Result Date: 07/31/2023 CLINICAL DATA:  Swelling. EXAM: Left UPPER EXTREMITY VENOUS DOPPLER ULTRASOUND TECHNIQUE: Gray-scale sonography with graded compression, as well as color Doppler and duplex ultrasound were performed to evaluate the upper extremity deep venous system from the level of the subclavian vein and including the jugular, axillary, basilic, radial, ulnar and upper cephalic vein. Spectral Doppler was utilized to evaluate flow at rest and with distal augmentation maneuvers. COMPARISON:  Chest CT 07/18/2023 FINDINGS: Contralateral Subclavian Vein: Respiratory phasicity is normal and symmetric with the symptomatic side. No evidence of  thrombus. Normal compressibility. Internal Jugular Vein: No evidence of thrombus. Normal compressibility, respiratory phasicity and response to augmentation. Subclavian Vein: No evidence of thrombus. Normal compressibility, respiratory phasicity and response to augmentation. Axillary Vein: No evidence of thrombus. Normal compressibility, respiratory phasicity and response to augmentation. Cephalic Vein: Poor flow on Doppler.  No compression. Basilic Vein: Poor flow on color and spectral Doppler. No compression. Brachial Veins: Areas of thrombus in the brachial vein are seen with poor flow on color and spectral Doppler and poor compression. Radial Veins: No evidence of thrombus. Normal compressibility, respiratory phasicity and response to augmentation. Ulnar Veins: No evidence of thrombus. Normal compressibility, respiratory phasicity and response to augmentation. Venous Reflux:  None visualized. Other Findings: Known mass lesions again  identified in the axillary region measuring up to 6.5 cm as seen on prior CT scan. Critical Value/emergent results were called by telephone at the time of interpretation on 07/31/2023 at 7:40 am to provider Willy Eddy , who verbally acknowledged these results. IMPRESSION: Areas of thrombus in the left brachial vein, basilic vein and cephalic vein. Mass lesions identified in the axillary region. Please correlate with the prior CT Electronically Signed   By: Karen Kays M.D.   On: 07/31/2023 10:41   DG Chest Portable 1 View  Result Date: 07/31/2023 CLINICAL DATA:  Shortness of breath, edema.  Evaluate for effusion. EXAM: PORTABLE CHEST 1 VIEW COMPARISON:  Chest radiograph 06/22/2023. CT chest/abdomen/pelvis 07/18/2023. FINDINGS: Low lung volumes accentuate the pulmonary vasculature and cardiomediastinal silhouette. No consolidation or pulmonary edema. Unchanged left basilar atelectasis. New small left pleural effusion. IMPRESSION: New small left pleural effusion.  Electronically Signed   By: Orvan Falconer M.D.   On: 07/31/2023 08:27        Scheduled Meds:  aspirin  81 mg Oral Daily   carvedilol  3.125 mg Oral BID WC   enoxaparin (LOVENOX) injection  1 mg/kg Subcutaneous Q12H   ferrous sulfate  325 mg Oral Q breakfast   hydrALAZINE  10 mg Oral Q8H   insulin aspart  0-5 Units Subcutaneous QHS   insulin aspart  0-9 Units Subcutaneous TID WC   insulin glargine-yfgn  15 Units Subcutaneous Daily   isosorbide mononitrate  30 mg Oral Daily   lactulose  30 g Oral TID   levothyroxine  50 mcg Oral QAC breakfast   mometasone-formoterol  2 puff Inhalation BID   nirmatrelvir/ritonavir (renal dosing)  2 tablet Oral BID   pantoprazole  40 mg Oral BID   senna-docusate  1 tablet Oral BID   tamsulosin  0.4 mg Oral QPC supper   Continuous Infusions:   LOS: 0 days       Charise Killian, MD [facility] Pager [phone]  If 7PM-7AM, please contact night-coverage [email or web address] 08/01/2023, 8:25 AM

## 2023-08-01 NOTE — Plan of Care (Signed)
  Problem: Fluid Volume: Goal: Hemodynamic stability will improve Outcome: Progressing   Problem: Respiratory: Goal: Ability to maintain adequate ventilation will improve Outcome: Progressing   Problem: Fluid Volume: Goal: Ability to maintain a balanced intake and output will improve Outcome: Progressing   Problem: Health Behavior/Discharge Planning: Goal: Ability to identify and utilize available resources and services will improve Outcome: Progressing Goal: Ability to manage health-related needs will improve Outcome: Progressing   Problem: Skin Integrity: Goal: Risk for impaired skin integrity will decrease Outcome: Progressing   Problem: Tissue Perfusion: Goal: Adequacy of tissue perfusion will improve Outcome: Progressing   Problem: Coping: Goal: Psychosocial and spiritual needs will be supported Outcome: Progressing   Problem: Respiratory: Goal: Will maintain a patent airway Outcome: Progressing   Problem: Clinical Measurements: Goal: Cardiovascular complication will be avoided Outcome: Progressing   Problem: Activity: Goal: Risk for activity intolerance will decrease Outcome: Progressing   Problem: Coping: Goal: Level of anxiety will decrease Outcome: Progressing   Problem: Elimination: Goal: Will not experience complications related to bowel motility Outcome: Progressing Goal: Will not experience complications related to urinary retention Outcome: Progressing   Problem: Pain Managment: Goal: General experience of comfort will improve Outcome: Progressing   Problem: Safety: Goal: Ability to remain free from injury will improve Outcome: Progressing   Problem: Skin Integrity: Goal: Risk for impaired skin integrity will decrease Outcome: Progressing

## 2023-08-01 NOTE — Evaluation (Signed)
Occupational Therapy Evaluation Patient Details Name: Roberto Ingram MRN: [id number] DOB: Sep 09, 1951 Today's Date: 08/01/2023   History of Present Illness 72 y.o. male with medical history significant of CAD status post CABGx7 in 2007, recently diagnosed anaplastic large cell lymphoma, GIST tumor, CKD stage IV, HTN, gout, IDDM, COPD, BPH, chronic pain syndrome and narcotic dependence, presented with worsening of generalized weakness and covid +.   Clinical Impression   Patient presenting with decreased Ind in self care,balance, functional mobility/transfers, endurance, and safety awareness. Patient reports being Mod I at baseline with use of RW. He has been more supervision level since CA dx and needing increased time to process information per wife.Patient currently performing functional transfers and ambulation to bathroom with RW and supervision overall. Pt stands at sink for hand hygiene without physical assistance but does appear to fatigue quickly. He is on RA and noted that oxygen saturation is 84% on RA. MD present in room and pt placed on 2Ls via [place]. RN also notified. Pt seated on EOB with MD and wife present in room. Patient will benefit from acute OT to increase overall independence in the areas of ADLs, functional mobility, and safety awareness in order to safely discharge.      If plan is discharge home, recommend the following: A little help with walking and/or transfers;A little help with bathing/dressing/bathroom;Assistance with cooking/housework;Direct supervision/assist for medications management;Direct supervision/assist for financial management;Assist for transportation;Help with stairs or ramp for entrance    Functional Status Assessment  Patient has had a recent decline in their functional status and demonstrates the ability to make significant improvements in function in a reasonable and predictable amount of time.  Equipment Recommendations  None recommended by OT        Precautions / Restrictions Precautions Precautions: Fall      Mobility Bed Mobility Overal bed mobility: Needs Assistance Bed Mobility: Supine to Sit     Supine to sit: Supervision, HOB elevated, Used rails          Transfers Overall transfer level: Needs assistance Equipment used: Rolling walker (2 wheels) Transfers: Sit to/from Stand Sit to Stand: Supervision                  Balance Overall balance assessment: Needs assistance Sitting-balance support: No upper extremity supported, Feet supported Sitting balance-Leahy Scale: Good     Standing balance support: During functional activity, Bilateral upper extremity supported, Reliant on assistive device for balance Standing balance-Leahy Scale: Fair                             ADL either performed or assessed with clinical judgement   ADL Overall ADL's : Needs assistance/impaired     Grooming: Wash/dry hands;Standing;Supervision/safety                   Toilet Transfer: Supervision/safety;Rolling walker (2 wheels);Regular Toilet           Functional mobility during ADLs: Rolling walker (2 wheels);Cueing for safety;Supervision/safety       Vision Patient Visual Report: No change from baseline              Pertinent Vitals/Pain Pain Assessment Pain Assessment: 0-10 Pain Score: 4  Pain Location: L shoulder Pain Descriptors / Indicators: Discomfort, Aching Pain Intervention(s): Monitored during session, Repositioned     Extremity/Trunk Assessment Upper Extremity Assessment Upper Extremity Assessment: Generalized weakness;LUE deficits/detail LUE Deficits / Details: increased edema and pain from prior lymph  node procedure with decreasesd shoulder elevation to ~ 100 degrees   Lower Extremity Assessment Lower Extremity Assessment: Generalized weakness (B TED hose donned)       Communication Communication Communication: No apparent difficulties   Cognition Arousal:  Alert Behavior During Therapy: Flat affect, WFL for tasks assessed/performed Overall Cognitive Status: Impaired/Different from baseline                                 General Comments: Pt needs increased time to process information                Home Living Family/patient expects to be discharged to:: Private residence Living Arrangements: Spouse/significant other Available Help at Discharge: Family;Available 24 hours/day Type of Home: House Home Access: Stairs to enter;Ramped entrance Entrance Stairs-Number of Steps: 8 (steps in front and ramp in back) Entrance Stairs-Rails: Right;Left Home Layout: One level     Bathroom Shower/Tub: Chief Strategy Officer: Handicapped height     Home Equipment: Agricultural consultant (2 wheels);BSC/3in1      Lives With: Spouse    Prior Functioning/Environment Prior Level of Function : Independent/Modified Independent;History of Falls (last six months)             Mobility Comments: Pt has been mod I - supervision with RW in the last few weeks since GI procedure and CA dx ADLs Comments: Pt reports being Ind in self care but if needing some assistance wife is available to assist        OT Problem List: Decreased strength;Decreased activity tolerance;Decreased safety awareness;Impaired balance (sitting and/or standing);Decreased knowledge of use of DME or AE      OT Treatment/Interventions: Self-care/ADL training;Therapeutic exercise;Therapeutic activities;Energy conservation;DME and/or AE instruction;Patient/family education;Balance training    OT Goals(Current goals can be found in the care plan section) Acute Rehab OT Goals Patient Stated Goal: to feel better OT Goal Formulation: With patient/family Time For Goal Achievement: 08/15/23 Potential to Achieve Goals: Fair ADL Goals Pt Will Perform Grooming: with modified independence;standing Pt Will Perform Lower Body Dressing: with modified independence;sit  to/from stand Pt Will Transfer to Toilet: with modified independence;ambulating Pt Will Perform Toileting - Clothing Manipulation and hygiene: with modified independence;sit to/from stand  OT Frequency: Min 1X/week       AM-PAC OT "6 Clicks" Daily Activity     Outcome Measure Help from another person eating meals?: None Help from another person taking care of personal grooming?: None Help from another person toileting, which includes using toliet, bedpan, or urinal?: A Little Help from another person bathing (including washing, rinsing, drying)?: A Little Help from another person to put on and taking off regular upper body clothing?: A Little Help from another person to put on and taking off regular lower body clothing?: A Little 6 Click Score: 20   End of Session Equipment Utilized During Treatment: Rolling walker (2 wheels) Nurse Communication: Other (comment)  Activity Tolerance: Patient limited by fatigue Patient left: in bed;with call bell/phone within reach;with bed alarm set;with family/visitor present  OT Visit Diagnosis: Other abnormalities of gait and mobility (R26.89);Muscle weakness (generalized) (M62.81)                Time: 1610-9604 OT Time Calculation (min): 24 min Charges:  OT General Charges $OT Visit: 1 Visit OT Evaluation $OT Eval Moderate Complexity: 1 Mod OT Treatments $Self Care/Home Management : 8-22 mins  Jackquline Denmark, MS, OTR/L , CBIS ascom [phone]  08/01/23, 12:39 PM

## 2023-08-02 DIAGNOSIS — U071 COVID-19: Secondary | ICD-10-CM | POA: Diagnosis not present

## 2023-08-02 LAB — GLUCOSE, CAPILLARY
Glucose-Capillary: 95 mg/dL (ref 70–99)
Glucose-Capillary: 156 mg/dL — ABNORMAL HIGH (ref 70–99)
Glucose-Capillary: 107 mg/dL — ABNORMAL HIGH (ref 70–99)
Glucose-Capillary: 143 mg/dL — ABNORMAL HIGH (ref 70–99)

## 2023-08-02 MED ORDER — TERBINAFINE HCL 1 % EX CREA
TOPICAL_CREAM | Freq: Two times a day (BID) | CUTANEOUS | Status: DC | PRN
  Filled 2023-08-02 (×2): qty 12

## 2023-08-02 MED ORDER — BENZONATATE 100 MG PO CAPS
200.0000 mg | ORAL_CAPSULE | Freq: Three times a day (TID) | ORAL | Status: DC | PRN
  Administered 2023-08-02 – 2023-08-03 (×2): 200 mg via ORAL
  Filled 2023-08-02 (×2): qty 2

## 2023-08-02 MED ORDER — GLUCERNA SHAKE PO LIQD
237.0000 mL | Freq: Three times a day (TID) | ORAL | Status: DC
  Administered 2023-08-02 – 2023-08-04 (×7): 237 mL via ORAL

## 2023-08-02 NOTE — Progress Notes (Signed)
Physical Therapy Treatment Patient Details Name: Roberto Ingram MRN: [id number] DOB: [birth year] Today's Date: 08/02/2023   History of Present Illness Pt is a 72 y.o. male presenting to hospital 07/31/23 with c/o leg swelling, diffuse pain, and some SOB.  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    PT Comments  Pt is progressing performing bed mobility, tranfers, and gait at supervision level with RW for balance. Pt progressed gait distance to 33ft but reported fatigue when ready to turn around and head back to room (SPO2 91-93% RA after gait, cues for breathing technique).  Continued PT will assist pt towards increased strength and activity tolerance for greater safety and independence with mobility.   If plan is discharge home, recommend the following: A little help with walking and/or transfers;A little help with bathing/dressing/bathroom;Assistance with cooking/housework;Assist for transportation;Help with stairs or ramp for entrance   Can travel by private vehicle        Equipment Recommendations  BSC/3in1;Other (comment)    Recommendations for Other Services Rehab consult     Precautions / Restrictions Precautions Precautions: Fall Restrictions Weight Bearing Restrictions: No     Mobility  Bed Mobility Overal bed mobility: Modified Independent Bed Mobility: Supine to Sit, Sit to Supine     Supine to sit: Used rails, Supervision Sit to supine: Supervision   General bed mobility comments: mild increased effort/time to perform on own    Transfers Overall transfer level: Needs assistance Equipment used: Rolling walker (2 wheels) Transfers: Sit to/from Stand, Bed to chair/wheelchair/BSC Sit to Stand: Supervision   Step pivot transfers:  Supervision            Ambulation/Gait Ambulation/Gait assistance: Supervision Gait Distance (Feet): 70 Feet (+30) Assistive device: Rolling walker (2 wheels) Gait Pattern/deviations: Step-through pattern, Decreased step length - right, Decreased step length - left Gait velocity: decreased     General Gait Details: reported fatigue when ready to turn around.   Stairs             Wheelchair Mobility     Tilt Bed    Modified Rankin (Stroke Patients Only)       Balance Overall balance assessment: Needs assistance Sitting-balance support: No upper extremity supported, Feet supported Sitting balance-Leahy Scale: Good Sitting balance - Comments: steady reaching within BOS   Standing balance support: During functional activity, Bilateral upper extremity supported, Reliant on assistive device for balance Standing balance-Leahy Scale: Fair Standing balance comment: steady static standing (no UE support)                            Cognition Arousal: Alert Behavior During Therapy: Flat affect, WFL for tasks assessed/performed Overall Cognitive Status: Impaired/Different from baseline Area of Impairment: Safety/judgement, Following commands                       Following Commands: Follows one step commands with increased time Safety/Judgement: Decreased awareness of deficits   Problem Solving: Slow processing, Decreased initiation, Requires verbal cues General Comments: Increased time to process information        Exercises      General Comments        Pertinent Vitals/Pain Pain Assessment Pain Assessment: Faces Faces Pain Scale: Hurts little more Pain Location: L shoulder  Pain Descriptors / Indicators: Discomfort, Aching Pain Intervention(s): Repositioned    Home Living                          Prior Function            PT Goals (current goals can now be found in the care plan section) Acute Rehab PT  Goals Patient Stated Goal: to go home PT Goal Formulation: With patient/family Time For Goal Achievement: 08/15/23 Potential to Achieve Goals: Good Progress towards PT goals: Progressing toward goals    Frequency    Min 1X/week      PT Plan      Co-evaluation              AM-PAC PT "6 Clicks" Mobility   Outcome Measure  Help needed turning from your back to your side while in a flat bed without using bedrails?: None Help needed moving from lying on your back to sitting on the side of a flat bed without using bedrails?: A Little Help needed moving to and from a bed to a chair (including a wheelchair)?: A Little Help needed standing up from a chair using your arms (e.g., wheelchair or bedside chair)?: A Little Help needed to walk in hospital room?: A Little Help needed climbing 3-5 steps with a railing? : A Little 6 Click Score: 19    End of Session Equipment Utilized During Treatment: Gait belt Activity Tolerance: Patient tolerated treatment well Patient left: in bed;with call bell/phone within reach;with bed alarm set;with family/visitor present Nurse Communication: Mobility status PT Visit Diagnosis: Unsteadiness on feet (R26.81);Muscle weakness (generalized) (M62.81);Other abnormalities of gait and mobility (R26.89);Pain Pain - Right/Left: Left     Time: 4098-1191 PT Time Calculation (min) (ACUTE ONLY): 24 min  Charges:    $Gait Training: 8-22 mins $Therapeutic Activity: 8-22 mins PT General Charges $$ ACUTE PT VISIT: 1 Visit                     Hortencia Conradi, PTA  08/02/23, 12:35 PM

## 2023-08-02 NOTE — Plan of Care (Signed)
  Problem: Fluid Volume: Goal: Hemodynamic stability will improve Outcome: Progressing   Problem: Clinical Measurements: Goal: Diagnostic test results will improve Outcome: Progressing Goal: Signs and symptoms of infection will decrease Outcome: Progressing   Problem: Respiratory: Goal: Ability to maintain adequate ventilation will improve Outcome: Progressing   Problem: Education: Goal: Ability to describe self-care measures that may prevent or decrease complications (Diabetes Survival Skills Education) will improve Outcome: Progressing Goal: Individualized Educational Video(s) Outcome: Progressing   Problem: Coping: Goal: Ability to adjust to condition or change in health will improve Outcome: Progressing   Problem: Fluid Volume: Goal: Ability to maintain a balanced intake and output will improve Outcome: Progressing   Problem: Health Behavior/Discharge Planning: Goal: Ability to identify and utilize available resources and services will improve Outcome: Progressing Goal: Ability to manage health-related needs will improve Outcome: Progressing   Problem: Metabolic: Goal: Ability to maintain appropriate glucose levels will improve Outcome: Progressing   Problem: Nutritional: Goal: Maintenance of adequate nutrition will improve Outcome: Progressing Goal: Progress toward achieving an optimal weight will improve Outcome: Progressing   Problem: Skin Integrity: Goal: Risk for impaired skin integrity will decrease Outcome: Progressing   Problem: Tissue Perfusion: Goal: Adequacy of tissue perfusion will improve Outcome: Progressing   Problem: Education: Goal: Knowledge of risk factors and measures for prevention of condition will improve Outcome: Progressing   Problem: Coping: Goal: Psychosocial and spiritual needs will be supported Outcome: Progressing   Problem: Respiratory: Goal: Will maintain a patent airway Outcome: Progressing Goal: Complications related  to the disease process, condition or treatment will be avoided or minimized Outcome: Progressing   Problem: Education: Goal: Knowledge of General Education information will improve Description: Including pain rating scale, medication(s)/side effects and non-pharmacologic comfort measures Outcome: Progressing   Problem: Health Behavior/Discharge Planning: Goal: Ability to manage health-related needs will improve Outcome: Progressing   Problem: Clinical Measurements: Goal: Ability to maintain clinical measurements within normal limits will improve Outcome: Progressing Goal: Will remain free from infection Outcome: Progressing Goal: Diagnostic test results will improve Outcome: Progressing Goal: Respiratory complications will improve Outcome: Progressing Goal: Cardiovascular complication will be avoided Outcome: Progressing   Problem: Activity: Goal: Risk for activity intolerance will decrease Outcome: Progressing   Problem: Nutrition: Goal: Adequate nutrition will be maintained Outcome: Progressing   Problem: Coping: Goal: Level of anxiety will decrease Outcome: Progressing   Problem: Elimination: Goal: Will not experience complications related to bowel motility Outcome: Progressing Goal: Will not experience complications related to urinary retention Outcome: Progressing   Problem: Pain Managment: Goal: General experience of comfort will improve Outcome: Progressing   Problem: Safety: Goal: Ability to remain free from injury will improve Outcome: Progressing   Problem: Skin Integrity: Goal: Risk for impaired skin integrity will decrease Outcome: Progressing

## 2023-08-02 NOTE — Progress Notes (Signed)
PROGRESS NOTE    Roberto Ingram  UVO:[id number] DOB: Mar 27, 1951 DOA: 07/31/2023 PCP: Erasmo Downer, NP    Assessment & Plan:   Principal Problem:   Impaired ambulation Active Problems:   Essential hypertension   Arm DVT (deep venous thromboembolism), acute, left (HCC)   CKD (chronic kidney disease), stage IV (HCC)   COVID-19 virus infection   COVID-19  Assessment and Plan: Left brachial vein DVT: continue on lovenox and will switch to eliquis sometime prior to d/c and treat for 3-6 months. Recent left axillary lymph node biopsy    COVID infection: continue on paxlovid.Continue on airborne & contact precautions.   Hx of lymphoma: has not started chemo yet. Management per onco outpatient    Acute on chronic diastolic CHF: holding bumex. Monitor I/Os.     Hyponatremia: labile. Will continue to monitor    CAP: completed abx course. Continue bronchodilators & encourage incentive spirometry    CKDIV:  Cr is trending up from day prior. Avoid nephrotoxic meds.   HTN: continue on imdur, coreg, hydralazine. D/c clonidine, amlodipine w/ hx of CHF & worsening peripheral edema    DM2: poorly controlled, HbA1c 7.8. Continue on glargine, SSI w/ accuchecks   Deconditioning: PT/OT recs HH    Chronic iron deficiency anemia: continue on iron supplement    Hx of CAD: continue on coreg, aspirn. Holding statin, plavix while on paxlovid    BPH: continue on flomax   COPD: w/o exacerbation. Bronchodilators prn    Chronic constipation: continue on dulcolax, lactulose   Thrombocytosis: etiology unclear. Will continue to monitor   Leukocytosis: likely secondary to hx of lymphoma    DVT prophylaxis: lovenox  Code Status: full  Family Communication: discussed pt's care w/ pt's wife at bedside and answered her questions  Disposition Plan: likely d/c back home w/ home health   Level of care: Med-Surg Status is: Inpatient Remains inpatient appropriate because: severity of  illness     Consultants:    Procedures:   Antimicrobials:   Subjective: Pt c/o malaise   Objective: Vitals:   08/01/23 1551 08/01/23 2106 08/02/23 0122 08/02/23 0757  BP: (!) 104/50 (!) 106/54 137/86 (!) 132/52  Pulse: 78  96 93  Resp: 16  18 18   Temp: (!) 97.3 F (36.3 C)  98.9 F (37.2 C) 99 F (37.2 C)  TempSrc:      SpO2: 100%  96% 95%    Intake/Output Summary (Last 24 hours) at 08/02/2023 7425 Last data filed at 08/02/2023 0700 Gross per 24 hour  Intake 300 ml  Output 250 ml  Net 50 ml   There were no vitals filed for this visit.  Examination:  General exam: Appears comfortable  Respiratory system: decreased breath sounds b/l  Cardiovascular system: S1/S2+. No rubs or clicks  Gastrointestinal system: abd is soft, NT, ND & hypoactive bowel sounds  Central nervous system: alert & awake. Moves all extremities  Psychiatry: Judgement and insight appears at baseline. Flat mood and affect    Data Reviewed: I have personally reviewed following labs and imaging studies  CBC: Recent Labs  Lab 07/31/23 0024 08/01/23 0441  WBC 28.6* 25.2*  NEUTROABS 24.4*  --   HGB 8.9* 8.1*  HCT 28.5* 25.5*  MCV 84.3 83.3  PLT 484* 449*   Basic Metabolic Panel: Recent Labs  Lab 07/31/23 0024 08/01/23 0441  NA 127* 132*  K 4.8 4.5  CL 95* 97*  CO2 23 25  GLUCOSE 156* 134*  BUN 33* 33*  CREATININE 2.20* 2.34*  CALCIUM 8.4* 8.4*   GFR: Estimated Creatinine Clearance: 34.4 mL/min (A) (by C-G formula based on SCr of 2.34 mg/dL (H)). Liver Function Tests: Recent Labs  Lab 07/31/23 0024  AST 26  ALT 16  ALKPHOS 66  BILITOT 0.6  PROT 7.5  ALBUMIN 2.4*   No results for input(s): "LIPASE", "AMYLASE" in the last 168 hours. No results for input(s): "AMMONIA" in the last 168 hours. Coagulation Profile: Recent Labs  Lab 07/31/23 1523  INR 1.4*   Cardiac Enzymes: No results for input(s): "CKTOTAL", "CKMB", "CKMBINDEX", "TROPONINI" in the last 168 hours. BNP  (last 3 results) No results for input(s): "PROBNP" in the last 8760 hours. HbA1C: No results for input(s): "HGBA1C" in the last 72 hours. CBG: Recent Labs  Lab 08/01/23 0822 08/01/23 1137 08/01/23 1737 08/01/23 2150 08/02/23 0801  GLUCAP 130* 136* 91 125* 95   Lipid Profile: No results for input(s): "CHOL", "HDL", "LDLCALC", "TRIG", "CHOLHDL", "LDLDIRECT" in the last 72 hours. Thyroid Function Tests: Recent Labs    07/31/23 0044  TSH 4.276   Anemia Panel: No results for input(s): "VITAMINB12", "FOLATE", "FERRITIN", "TIBC", "IRON", "RETICCTPCT" in the last 72 hours. Sepsis Labs: No results for input(s): "PROCALCITON", "LATICACIDVEN" in the last 168 hours.  Recent Results (from the past 240 hour(s))  SARS Coronavirus 2 by RT PCR (hospital order, performed in [facility] hospital lab) *cepheid single result test* Anterior Nasal Swab     Status: Abnormal   Collection Time: 07/31/23  7:39 AM   Specimen: Anterior Nasal Swab  Result Value Ref Range Status   SARS Coronavirus 2 by RT PCR POSITIVE (A) NEGATIVE Final    Comment: (NOTE) SARS-CoV-2 target nucleic acids are DETECTED  SARS-CoV-2 RNA is generally detectable in upper respiratory specimens  during the acute phase of infection.  Positive results are indicative  of the presence of the identified virus, but do not rule out bacterial infection or co-infection with other pathogens not detected by the test.  Clinical correlation with patient history and  other diagnostic information is necessary to determine patient infection status.  The expected result is negative.  Fact Sheet for Patients:   RoadLapTop.co.za   Fact Sheet for Healthcare Providers:   http://kim-miller.com/    This test is not yet approved or cleared by the [place] FDA and  has been authorized for detection and/or diagnosis of SARS-CoV-2 by FDA under an Emergency Use Authorization (EUA).  This EUA  will remain in effect (meaning this test can be used) for the duration of  the COVID-19 declaration under Section 564(b)(1)  of the Act, 21 U.S.C. section 360-bbb-3(b)(1), unless the authorization is terminated or revoked sooner.   Performed at [facility], [address], [place], [place] [address]          Radiology Studies: US Venous Img Lower Bilateral  Result Date: 07/31/2023 CLINICAL DATA:  Swelling. EXAM: Bilateral LOWER EXTREMITY VENOUS DOPPLER ULTRASOUND TECHNIQUE: Gray-scale sonography with compression, as well as color and duplex ultrasound, were performed to evaluate the deep venous system(s) from the level of the common femoral vein through the popliteal and proximal calf veins. COMPARISON:  None Available. FINDINGS: VENOUS Normal compressibility of the common femoral, superficial femoral, and popliteal veins, as well as the visualized calf veins. Visualized portions of profunda femoral vein and great saphenous vein unremarkable. No filling defects to suggest DVT on grayscale or color Doppler imaging. Doppler waveforms show normal direction of venous flow, normal respiratory plasticity and response  to augmentation. Limited views of the contralateral common femoral vein are unremarkable. OTHER None. Limitations: none IMPRESSION: No evidence of bilateral lower extremity DVT Electronically Signed   By: Karen Kays M.D.   On: 07/31/2023 10:42   US Venous Img Upper Uni Left  Result Date: 07/31/2023 CLINICAL DATA:  Swelling. EXAM: Left UPPER EXTREMITY VENOUS DOPPLER ULTRASOUND TECHNIQUE: Gray-scale sonography with graded compression, as well as color Doppler and duplex ultrasound were performed to evaluate the upper extremity deep venous system from the level of the subclavian vein and including the jugular, axillary, basilic, radial, ulnar and upper cephalic vein. Spectral Doppler was utilized to evaluate flow at rest and with distal augmentation maneuvers. COMPARISON:   Chest CT 07/18/2023 FINDINGS: Contralateral Subclavian Vein: Respiratory phasicity is normal and symmetric with the symptomatic side. No evidence of thrombus. Normal compressibility. Internal Jugular Vein: No evidence of thrombus. Normal compressibility, respiratory phasicity and response to augmentation. Subclavian Vein: No evidence of thrombus. Normal compressibility, respiratory phasicity and response to augmentation. Axillary Vein: No evidence of thrombus. Normal compressibility, respiratory phasicity and response to augmentation. Cephalic Vein: Poor flow on Doppler.  No compression. Basilic Vein: Poor flow on color and spectral Doppler. No compression. Brachial Veins: Areas of thrombus in the brachial vein are seen with poor flow on color and spectral Doppler and poor compression. Radial Veins: No evidence of thrombus. Normal compressibility, respiratory phasicity and response to augmentation. Ulnar Veins: No evidence of thrombus. Normal compressibility, respiratory phasicity and response to augmentation. Venous Reflux:  None visualized. Other Findings: Known mass lesions again identified in the axillary region measuring up to 6.5 cm as seen on prior CT scan. Critical Value/emergent results were called by telephone at the time of interpretation on 07/31/2023 at 7:40 am to provider Willy Eddy , who verbally acknowledged these results. IMPRESSION: Areas of thrombus in the left brachial vein, basilic vein and cephalic vein. Mass lesions identified in the axillary region. Please correlate with the prior CT Electronically Signed   By: Karen Kays M.D.   On: 07/31/2023 10:41   DG Chest Portable 1 View  Result Date: 07/31/2023 CLINICAL DATA:  Shortness of breath, edema.  Evaluate for effusion. EXAM: PORTABLE CHEST 1 VIEW COMPARISON:  Chest radiograph 06/22/2023. CT chest/abdomen/pelvis 07/18/2023. FINDINGS: Low lung volumes accentuate the pulmonary vasculature and cardiomediastinal silhouette. No  consolidation or pulmonary edema. Unchanged left basilar atelectasis. New small left pleural effusion. IMPRESSION: New small left pleural effusion. Electronically Signed   By: Orvan Falconer M.D.   On: 07/31/2023 08:27        Scheduled Meds:  aspirin  81 mg Oral Daily   carvedilol  3.125 mg Oral BID WC   enoxaparin (LOVENOX) injection  1 mg/kg Subcutaneous Q12H   ferrous sulfate  325 mg Oral Q breakfast   hydrALAZINE  10 mg Oral Q8H   insulin aspart  0-5 Units Subcutaneous QHS   insulin aspart  0-9 Units Subcutaneous TID WC   insulin glargine-yfgn  15 Units Subcutaneous Daily   isosorbide mononitrate  30 mg Oral Daily   levothyroxine  50 mcg Oral QAC breakfast   mometasone-formoterol  2 puff Inhalation BID   nirmatrelvir/ritonavir (renal dosing)  2 tablet Oral BID   pantoprazole  40 mg Oral BID   senna-docusate  1 tablet Oral BID   tamsulosin  0.4 mg Oral QPC supper   Continuous Infusions:   LOS: 1 day       Charise Killian, MD [facility] Pager [phone]  If 7PM-7AM, please contact night-coverage [email or web address] 08/02/2023, 8:08 AM

## 2023-08-03 DIAGNOSIS — U071 COVID-19: Secondary | ICD-10-CM | POA: Diagnosis not present

## 2023-08-03 LAB — BASIC METABOLIC PANEL
Anion gap: 7 (ref 5–15)
BUN: 31 mg/dL — ABNORMAL HIGH (ref 8–23)
CO2: 25 mmol/L (ref 22–32)
Calcium: 8 mg/dL — ABNORMAL LOW (ref 8.9–10.3)
Chloride: 99 mmol/L (ref 98–111)
Creatinine, Ser: 2.42 mg/dL — ABNORMAL HIGH (ref 0.61–1.24)
GFR, Estimated: 28 mL/min — ABNORMAL LOW (ref >60)
Glucose, Bld: 143 mg/dL — ABNORMAL HIGH (ref 70–99)
Potassium: 4.6 mmol/L (ref 3.5–5.1)
Sodium: 131 mmol/L — ABNORMAL LOW (ref 135–145)

## 2023-08-03 LAB — CBC
HCT: 24.1 % — ABNORMAL LOW (ref 39.0–52.0)
Hemoglobin: 8 g/dL — ABNORMAL LOW (ref 13.0–17.0)
MCH: 26.6 pg (ref 26.0–34.0)
MCHC: 33.2 g/dL (ref 30.0–36.0)
MCV: 80.1 fL (ref 80.0–100.0)
Platelets: 398 10*3/uL (ref 150–400)
RBC: 3.01 MIL/uL — ABNORMAL LOW (ref 4.22–5.81)
RDW: 17.9 % — ABNORMAL HIGH (ref 11.5–15.5)
WBC: 24.1 10*3/uL — ABNORMAL HIGH (ref 4.0–10.5)
nRBC: 0 % (ref 0.0–0.2)

## 2023-08-03 LAB — GLUCOSE, CAPILLARY
Glucose-Capillary: 118 mg/dL — ABNORMAL HIGH (ref 70–99)
Glucose-Capillary: 171 mg/dL — ABNORMAL HIGH (ref 70–99)
Glucose-Capillary: 203 mg/dL — ABNORMAL HIGH (ref 70–99)
Glucose-Capillary: 221 mg/dL — ABNORMAL HIGH (ref 70–99)

## 2023-08-03 LAB — HEPARIN ANTI-XA: Heparin LMW: 0.93 IU/mL

## 2023-08-03 MED ORDER — ADULT MULTIVITAMIN W/MINERALS CH
1.0000 | ORAL_TABLET | Freq: Every day | ORAL | Status: DC
  Administered 2023-08-03 – 2023-08-04 (×2): 1 via ORAL
  Filled 2023-08-03 (×2): qty 1

## 2023-08-03 MED ORDER — SODIUM CHLORIDE 0.9 % IV SOLN: INTRAVENOUS | Status: DC

## 2023-08-03 NOTE — Plan of Care (Signed)
  Problem: Fluid Volume: Goal: Hemodynamic stability will improve Outcome: Progressing   Problem: Clinical Measurements: Goal: Diagnostic test results will improve Outcome: Progressing Goal: Signs and symptoms of infection will decrease Outcome: Progressing   Problem: Respiratory: Goal: Ability to maintain adequate ventilation will improve Outcome: Progressing   Problem: Education: Goal: Ability to describe self-care measures that may prevent or decrease complications (Diabetes Survival Skills Education) will improve Outcome: Progressing Goal: Individualized Educational Video(s) Outcome: Progressing   Problem: Coping: Goal: Ability to adjust to condition or change in health will improve Outcome: Progressing   Problem: Fluid Volume: Goal: Ability to maintain a balanced intake and output will improve Outcome: Progressing   Problem: Health Behavior/Discharge Planning: Goal: Ability to identify and utilize available resources and services will improve Outcome: Progressing Goal: Ability to manage health-related needs will improve Outcome: Progressing   Problem: Metabolic: Goal: Ability to maintain appropriate glucose levels will improve Outcome: Progressing   Problem: Nutritional: Goal: Maintenance of adequate nutrition will improve Outcome: Progressing Goal: Progress toward achieving an optimal weight will improve Outcome: Progressing   Problem: Skin Integrity: Goal: Risk for impaired skin integrity will decrease Outcome: Progressing   Problem: Tissue Perfusion: Goal: Adequacy of tissue perfusion will improve Outcome: Progressing   Problem: Education: Goal: Knowledge of risk factors and measures for prevention of condition will improve Outcome: Progressing   Problem: Coping: Goal: Psychosocial and spiritual needs will be supported Outcome: Progressing   Problem: Respiratory: Goal: Will maintain a patent airway Outcome: Progressing Goal: Complications related  to the disease process, condition or treatment will be avoided or minimized Outcome: Progressing   Problem: Education: Goal: Knowledge of General Education information will improve Description: Including pain rating scale, medication(s)/side effects and non-pharmacologic comfort measures Outcome: Progressing   Problem: Health Behavior/Discharge Planning: Goal: Ability to manage health-related needs will improve Outcome: Progressing   Problem: Clinical Measurements: Goal: Ability to maintain clinical measurements within normal limits will improve Outcome: Progressing Goal: Will remain free from infection Outcome: Progressing Goal: Diagnostic test results will improve Outcome: Progressing Goal: Respiratory complications will improve Outcome: Progressing Goal: Cardiovascular complication will be avoided Outcome: Progressing   Problem: Activity: Goal: Risk for activity intolerance will decrease Outcome: Progressing   Problem: Nutrition: Goal: Adequate nutrition will be maintained Outcome: Progressing   Problem: Coping: Goal: Level of anxiety will decrease Outcome: Progressing   Problem: Elimination: Goal: Will not experience complications related to bowel motility Outcome: Progressing Goal: Will not experience complications related to urinary retention Outcome: Progressing   Problem: Pain Managment: Goal: General experience of comfort will improve Outcome: Progressing   Problem: Safety: Goal: Ability to remain free from injury will improve Outcome: Progressing   Problem: Skin Integrity: Goal: Risk for impaired skin integrity will decrease Outcome: Progressing

## 2023-08-03 NOTE — Progress Notes (Signed)
PROGRESS NOTE    Roberto Ingram  ZOX:[id number] DOB: Jul 07, 1951 DOA: 07/31/2023 PCP: Erasmo Downer, NP    Assessment & Plan:   Principal Problem:   Impaired ambulation Active Problems:   Essential hypertension   Arm DVT (deep venous thromboembolism), acute, left (HCC)   CKD (chronic kidney disease), stage IV (HCC)   COVID-19 virus infection   COVID-19  Assessment and Plan: Left brachial vein DVT: continue on lovenox and will switch to eliquis sometime prior to d/c and treat for 3-6 months. Recent left axillary lymph node biopsy    COVID infection: continue on paxlovid. Continue on airborne & contact precautions  Hx of lymphoma: has not started chemo yet. Management per onco outpatient   Skin rash: present prior to admission. Needs to f/u outpatient w/ dermatology    Acute on chronic diastolic CHF: holding bumex. Monitor I/Os    Hyponatremia: labile. Will continue to monitor    CAP: completed abx course. Continue bronchodilators & encourage incentive spirometry    CKDIV:  Cr is trending up daily. If Cr continues to trend up will consult nephro. Avoid nephrotoxic meds   HTN: continue on imdur, coreg, hydralazine. D/c clonidine, amlodipine w/ hx of CHF & worsening peripheral edema    DM2: poorly controlled, HbA1c 7.8. Continue on glargine, SSI w/ accuchecks  Deconditioning: PT/OT recs HH    Chronic iron deficiency anemia: continue on iron supplements    Hx of CAD: continue on aspirin, coreg. Holding plavix, statin while on paxlovid    BPH: continue on flomax   COPD: w/o exacerbation. Bronchodilators prn    Chronic constipation: continue on lactulose, dulcolax   Thrombocytosis:  resolved   Leukocytosis: likely secondary to hx of lymphoma    DVT prophylaxis: lovenox  Code Status: full  Family Communication: discussed pt's care w/ pt's wife at bedside and answered her questions  Disposition Plan: likely d/c back home w/ home health   Level of care:  Med-Surg Status is: Inpatient Remains inpatient appropriate because: severity of illness, Cr is trending up daily      Consultants:    Procedures:   Antimicrobials:   Subjective: Pt c/o skin rash  Objective: Vitals:   08/02/23 1425 08/02/23 1700 08/03/23 0007 08/03/23 0759  BP: 131/61 138/67 (!) 142/55 (!) 135/55  Pulse: 92 96 99 96  Resp:  18 18 18   Temp:  99.1 F (37.3 C) 99 F (37.2 C) 98.8 F (37.1 C)  TempSrc:      SpO2: 96% 97% 96% 95%    Intake/Output Summary (Last 24 hours) at 08/03/2023 0849 Last data filed at 08/02/2023 2040 Gross per 24 hour  Intake 100 ml  Output 450 ml  Net -350 ml   There were no vitals filed for this visit.  Examination:  General exam: appears calm & comfortable  Respiratory system: diminished breath sounds b/l  Cardiovascular system: S1 & S2+. No rubs or clicks  Gastrointestinal system: abd is soft, NT, ND & hypoactive bowel sounds  Central nervous system: alert & awake. Moves all extremities  Psychiatry: Judgement and insight appears at baseline. Flat mood and affect     Data Reviewed: I have personally reviewed following labs and imaging studies  CBC: Recent Labs  Lab 07/31/23 0024 08/01/23 0441 08/03/23 0322  WBC 28.6* 25.2* 24.1*  NEUTROABS 24.4*  --   --   HGB 8.9* 8.1* 8.0*  HCT 28.5* 25.5* 24.1*  MCV 84.3 83.3 80.1  PLT 484* 449* 398   Basic  Metabolic Panel: Recent Labs  Lab 07/31/23 0024 08/01/23 0441 08/03/23 0322  NA 127* 132* 131*  K 4.8 4.5 4.6  CL 95* 97* 99  CO2 23 25 25   GLUCOSE 156* 134* 143*  BUN 33* 33* 31*  CREATININE 2.20* 2.34* 2.42*  CALCIUM 8.4* 8.4* 8.0*   GFR: Estimated Creatinine Clearance: 33.3 mL/min (A) (by C-G formula based on SCr of 2.42 mg/dL (H)). Liver Function Tests: Recent Labs  Lab 07/31/23 0024  AST 26  ALT 16  ALKPHOS 66  BILITOT 0.6  PROT 7.5  ALBUMIN 2.4*   No results for input(s): "LIPASE", "AMYLASE" in the last 168 hours. No results for input(s):  "AMMONIA" in the last 168 hours. Coagulation Profile: Recent Labs  Lab 07/31/23 1523  INR 1.4*   Cardiac Enzymes: No results for input(s): "CKTOTAL", "CKMB", "CKMBINDEX", "TROPONINI" in the last 168 hours. BNP (last 3 results) No results for input(s): "PROBNP" in the last 8760 hours. HbA1C: No results for input(s): "HGBA1C" in the last 72 hours. CBG: Recent Labs  Lab 08/02/23 0801 08/02/23 1226 08/02/23 1736 08/02/23 2116 08/03/23 0806  GLUCAP 95 156* 107* 143* 118*   Lipid Profile: No results for input(s): "CHOL", "HDL", "LDLCALC", "TRIG", "CHOLHDL", "LDLDIRECT" in the last 72 hours. Thyroid Function Tests: No results for input(s): "TSH", "T4TOTAL", "FREET4", "T3FREE", "THYROIDAB" in the last 72 hours.  Anemia Panel: No results for input(s): "VITAMINB12", "FOLATE", "FERRITIN", "TIBC", "IRON", "RETICCTPCT" in the last 72 hours. Sepsis Labs: No results for input(s): "PROCALCITON", "LATICACIDVEN" in the last 168 hours.  Recent Results (from the past 240 hour(s))  SARS Coronavirus 2 by RT PCR (hospital order, performed in [facility] hospital lab) *cepheid single result test* Anterior Nasal Swab     Status: Abnormal   Collection Time: 07/31/23  7:39 AM   Specimen: Anterior Nasal Swab  Result Value Ref Range Status   SARS Coronavirus 2 by RT PCR POSITIVE (A) NEGATIVE Final    Comment: (NOTE) SARS-CoV-2 target nucleic acids are DETECTED  SARS-CoV-2 RNA is generally detectable in upper respiratory specimens  during the acute phase of infection.  Positive results are indicative  of the presence of the identified virus, but do not rule out bacterial infection or co-infection with other pathogens not detected by the test.  Clinical correlation with patient history and  other diagnostic information is necessary to determine patient infection status.  The expected result is negative.  Fact Sheet for Patients:   RoadLapTop.co.za   Fact Sheet for  Healthcare Providers:   http://kim-miller.com/    This test is not yet approved or cleared by the [place] FDA and  has been authorized for detection and/or diagnosis of SARS-CoV-2 by FDA under an Emergency Use Authorization (EUA).  This EUA will remain in effect (meaning this test can be used) for the duration of  the COVID-19 declaration under Section 564(b)(1)  of the Act, 21 U.S.C. section 360-bbb-3(b)(1), unless the authorization is terminated or revoked sooner.   Performed at [facility], [address], [place], [place] [address]          Radiology Studies: No results found.      Scheduled Meds:  aspirin  81 mg Oral Daily   carvedilol  3.125 mg Oral BID WC   enoxaparin (LOVENOX) injection  1 mg/kg Subcutaneous Q12H   feeding supplement (GLUCERNA SHAKE)  237 mL Oral TID BM   ferrous sulfate  325 mg Oral Q breakfast   hydrALAZINE  10 mg Oral Q8H  insulin aspart  0-5 Units Subcutaneous QHS   insulin aspart  0-9 Units Subcutaneous TID WC   insulin glargine-yfgn  15 Units Subcutaneous Daily   isosorbide mononitrate  30 mg Oral Daily   levothyroxine  50 mcg Oral QAC breakfast   mometasone-formoterol  2 puff Inhalation BID   nirmatrelvir/ritonavir (renal dosing)  2 tablet Oral BID   pantoprazole  40 mg Oral BID   senna-docusate  1 tablet Oral BID   tamsulosin  0.4 mg Oral QPC supper   Continuous Infusions:  sodium chloride       LOS: 2 days       Charise Killian, MD [facility] Pager [phone]  If 7PM-7AM, please contact night-coverage [email or web address] 08/03/2023, 8:49 AM

## 2023-08-04 ENCOUNTER — Other Ambulatory Visit (HOSPITAL_COMMUNITY): Payer: Self-pay

## 2023-08-04 DIAGNOSIS — U071 COVID-19: Secondary | ICD-10-CM | POA: Diagnosis not present

## 2023-08-04 LAB — BASIC METABOLIC PANEL
Anion gap: 7 (ref 5–15)
BUN: 29 mg/dL — ABNORMAL HIGH (ref 8–23)
CO2: 25 mmol/L (ref 22–32)
Calcium: 7.9 mg/dL — ABNORMAL LOW (ref 8.9–10.3)
Chloride: 99 mmol/L (ref 98–111)
Creatinine, Ser: 1.92 mg/dL — ABNORMAL HIGH (ref 0.61–1.24)
GFR, Estimated: 36 mL/min — ABNORMAL LOW (ref >60)
Glucose, Bld: 146 mg/dL — ABNORMAL HIGH (ref 70–99)
Potassium: 4.7 mmol/L (ref 3.5–5.1)
Sodium: 131 mmol/L — ABNORMAL LOW (ref 135–145)

## 2023-08-04 LAB — CBC
HCT: 25 % — ABNORMAL LOW (ref 39.0–52.0)
Hemoglobin: 8 g/dL — ABNORMAL LOW (ref 13.0–17.0)
MCH: 26.1 pg (ref 26.0–34.0)
MCHC: 32 g/dL (ref 30.0–36.0)
MCV: 81.7 fL (ref 80.0–100.0)
Platelets: 369 10*3/uL (ref 150–400)
RBC: 3.06 MIL/uL — ABNORMAL LOW (ref 4.22–5.81)
RDW: 18 % — ABNORMAL HIGH (ref 11.5–15.5)
WBC: 24.2 10*3/uL — ABNORMAL HIGH (ref 4.0–10.5)
nRBC: 0 % (ref 0.0–0.2)

## 2023-08-04 LAB — GLUCOSE, CAPILLARY
Glucose-Capillary: 149 mg/dL — ABNORMAL HIGH (ref 70–99)
Glucose-Capillary: 183 mg/dL — ABNORMAL HIGH (ref 70–99)

## 2023-08-04 MED ORDER — APIXABAN 5 MG PO TABS: 5.0000 mg | ORAL_TABLET | Freq: Two times a day (BID) | ORAL | Status: DC

## 2023-08-04 MED ORDER — APIXABAN 5 MG PO TABS: ORAL_TABLET | ORAL | 1 refills | Status: AC

## 2023-08-04 MED ORDER — NIRMATRELVIR/RITONAVIR (PAXLOVID) TABLET (RENAL DOSING): 2.0000 | ORAL_TABLET | Freq: Two times a day (BID) | ORAL | Status: AC

## 2023-08-04 MED ORDER — ALUM & MAG HYDROXIDE-SIMETH 200-200-20 MG/5ML PO SUSP
30.0000 mL | ORAL | Status: DC | PRN
  Administered 2023-08-04: 30 mL via ORAL
  Filled 2023-08-04: qty 30

## 2023-08-04 MED ORDER — APIXABAN 5 MG PO TABS
10.0000 mg | ORAL_TABLET | Freq: Two times a day (BID) | ORAL | Status: DC
  Administered 2023-08-04: 10 mg via ORAL
  Filled 2023-08-04: qty 2

## 2023-08-04 MED ORDER — POLYETHYLENE GLYCOL 3350 17 G PO PACK
17.0000 g | PACK | Freq: Every day | ORAL | Status: DC
  Administered 2023-08-04: 17 g via ORAL
  Filled 2023-08-04: qty 1

## 2023-08-04 MED ORDER — ISOSORBIDE MONONITRATE ER 30 MG PO TB24: 30.0000 mg | ORAL_TABLET | Freq: Every day | ORAL | 0 refills | Status: DC

## 2023-08-04 MED ORDER — DOCUSATE SODIUM 100 MG PO CAPS
200.0000 mg | ORAL_CAPSULE | Freq: Two times a day (BID) | ORAL | Status: DC
  Administered 2023-08-04: 200 mg via ORAL
  Filled 2023-08-04: qty 2

## 2023-08-04 NOTE — Plan of Care (Signed)
Patient has been free of injury or falls. VSS. Patient reports pain at axilla from biopsy  relieved by medication. Patient airway has remained patent and patient's oxygen saturations are WNL. Nursing will continue to monitor progress towards goals.

## 2023-08-04 NOTE — TOC Benefit Eligibility Note (Signed)
Patient Product/process development scientist completed.    The patient is insured through [employer]. Patient has Medicare and is not eligible for a copay card, but may be able to apply for patient assistance, if available.    Ran test claim for Eliquis Starter Pack and the current 30 day co-pay is $172.10.   This test claim was processed through [facility]- copay amounts may vary at other pharmacies due to pharmacy/plan contracts, or as the patient moves through the different stages of their insurance plan.     Roland Earl, CPHT Pharmacy Technician III Certified Patient Advocate [facility] Patient Advocate Team Direct Number: [phone]  Fax: [phone]

## 2023-08-04 NOTE — Progress Notes (Addendum)
ANTICOAGULATION CONSULT NOTE  Pharmacy Consult for Lovenox (enoxaparin) to Eliquis (apixaban) transition Indication: DVT  Allergies  Allergen Reactions   Antihistamines, Chlorpheniramine-Type Swelling    Prostat   Diphenhydramine Hcl Swelling   Fluorescein Dermatitis, Hives, Itching and Other (See Comments)   Iodinated Contrast Media Other (See Comments)   Lisinopril Swelling   Ezetimibe Hives and Other (See Comments)   Furosemide Swelling   Metformin Hives and Other (See Comments)   Metoprolol Itching   Nsaids Other (See Comments)    Does take aspirin  Other Reaction(s): Kidney Disorder   Sulfa Antibiotics Hives and Other (See Comments)   Brilinta [Ticagrelor] Other (See Comments)    Headaches, off balance, no appetite   Diphenhydramine Hcl Other (See Comments)    "The more I take, it makes my prostate swell..."   Other Itching and Other (See Comments)    FA Dye Prostate swelling FA Dye  Prostate swelling    Patient Measurements:    Vital Signs: Temp: 99.5 F (37.5 C) (09/22 2157) BP: 145/60 (09/22 2157) Pulse Rate: 95 (09/22 2157)  Labs: Recent Labs    08/03/23 0322 08/03/23 1405 08/04/23 0525  HGB 8.0*  --  8.0*  HCT 24.1*  --  25.0*  PLT 398  --  369  HEPRLOWMOCWT  --  0.93  --   CREATININE 2.42*  --  1.92*    Estimated Creatinine Clearance: 42 mL/min (A) (by C-G formula based on SCr of 1.92 mg/dL (H)).   Medical History: Past Medical History:  Diagnosis Date   Anemia    Anxiety    Generalized anxiety disorder   Asthmatic bronchitis    Atopic dermatitis    BPH (benign prostatic hyperplasia)    Cardiovascular disease    Carotid disease, bilateral (HCC)    Colon polyp    Constipation    Coronary artery disease    Diabetes mellitus without complication (HCC)    Diabetic retinopathy (HCC)    GERD (gastroesophageal reflux disease)    Glaucoma of left eye    Gout    History of GI bleed    Hypercholesterolemia    Hypertension     Hypomagnesemia    Hypothyroidism    Insomnia    Mixed hyperlipidemia    Myocardial infarction (HCC)    Pneumonia    Renal disorder    Renal insufficiency    Sleep apnea    wears oxygen at night (unable to tolerate CPAP)   Stroke (HCC)    Stroke [facility])    Thyroid disease    Vitamin D deficiency     Assessment: 72 year old male with DVT being treated with Lovenox.   CBC stable.  Plan:  Give Eliquis10mg  at time of next Lovenox dose (9/23 @1000 ).  Eliquis 10mg  BID x 7days followed by Eliquis 5 mg BID thereafter.  Elliot Gurney, PharmD, BCPS Clinical Pharmacist  08/04/2023 8:22 AM

## 2023-08-04 NOTE — Discharge Summary (Addendum)
Physician Discharge Summary  Roberto Ingram ZOX:[id number] DOB: 1951-08-26 DOA: 07/31/2023  PCP: Erasmo Downer, NP  Admit date: 07/31/2023 Discharge date: 08/04/2023  Admitted From: home  Disposition:  home w/ home health   Recommendations for Outpatient Follow-up:  Follow up with PCP in 1-2 weeks F/u w/ onco w/in 1-2 weeks F/u w/ derm in 1-2 weeks   Home Health: yes Equipment/Devices:  Discharge Condition: stable  CODE STATUS: full  Diet recommendation: Heart Healthy / Carb Modified  Brief/Interim Summary: HPI was taken from Dr. Irving Burton: Roberto Ingram is a 72 y.o. male with medical history significant of CAD status post CABGx7 in 2007, recently diagnosed anaplastic large cell lymphoma, GIST tumor, CKD stage IV, HTN, gout, IDDM, COPD, BPH, chronic pain syndrome and narcotic dependence, presented with worsening of generalized weakness.   Patient started to have a cough last Thursday, when he coughs up light yellowish sputum, went to see his doctor at [facility] who diagnosed him with CAP and was started on Levaquin Q 48 hours x 4 doses and patient received a third dose last night.  Since yesterday patient has become weaker and family checked his temperature was 99.8 compared to his baseline temperature lower 98.  Family also reported that the patient has not been able to get out of bed since yesterday and when he did he had to use walker, even so he could only walk a few steps and had to retire.  Since Monday patient also has been having poor intake loss of taste.  No diarrhea but constipated.  Patient denies any pain including chest pain or abdominal pain or leg pain.   Last Wednesday, patient underwent lymph node dissection left axillary, and pathology came back positive for anaplastic large cell lymphoma for which patient is scheduled to see oncology to discuss about initiating chemotherapy.  Patient however experienced worsening of left upper arm swelling after the procedure and gradually  getting worse.   July this year, patient was hospitalized for GIST tumor resection.  Since then patient has developed worsening of bilateral lower extremity edema.  Due to worsening of kidney function his diuresis was held after July admission.  Patient was seen by nephrology last week and appears that Dr. Drexel Iha decided to continue to hold patient's diuresis given his kidney function remains above baseline.  Family reported that the patient continue to gain weight and along with worsening of peripheral edema, he gained 3 pounds in last 5 days.   ED Course: Temperature 98.5, blood pressure 134/62 O2 saturation 99% on room air.  Blood work showed sodium 127, creatinine 2.2 compared to baseline 2.0-2.2, bicarb 23K 4.8 hemoglobin 8.9, image study positive left pre-COVID pain DVT, negative bilateral lower extremity DVT.   Patient was started on Lovenox twice daily.  Patient was evaluated by general surgeon in the ED.  Discharge Diagnoses:  Principal Problem:   Impaired ambulation Active Problems:   Essential hypertension   Arm DVT (deep venous thromboembolism), acute, left (HCC)   CKD (chronic kidney disease), stage IV (HCC)   COVID-19 virus infection   COVID-19  Left brachial vein DVT: d/c lovenox and start eliquis & treat for 3-6 months. Recent left axillary lymph node biopsy    COVID infection: will have 1 day more of paxlovid. Continue on airborne & contact precautions   Hx of lymphoma: has not started chemo yet. Management per onco outpatient    Skin rash: present prior to admission. Needs to f/u outpatient w/ dermatology  Acute on chronic diastolic CHF: can restart bumex. Monitor I/Os    Hyponatremia: labile. Will continue to monitor    CAP: completed abx course. Continue bronchodilators & encourage incentive spirometry    CKDIV:  Cr is trending down today. Avoid nephrotoxic meds    HTN: continue on imdur, metoprolol, hydralazine. D/c clonidine, amlodipine w/ hx of CHF &  worsening peripheral edema    DM2: poorly controlled, HbA1c 7.8. Continue on glargine, SSI w/ accuchecks   Deconditioning: PT/OT recs HH    Chronic iron deficiency anemia: continue on iron supplements    Hx of CAD: continue on aspirin, metoprolol. D/c plavix as pt is on eliquis & aspirin now. Hold statin    BPH: continue on flomax    COPD: w/o exacerbation. Bronchodilators prn    Chronic constipation: continue on lactulose, dulcolax    Thrombocytosis:  resolved    Leukocytosis: likely secondary to hx of lymphoma     Discharge Instructions  Discharge Instructions     Diet - low sodium heart healthy   Complete by: As directed    Diet Carb Modified   Complete by: As directed    Discharge instructions   Complete by: As directed    F/u w/ PCP in 1-2 weeks. F/u w/ dermatology in 1-2 weeks. F/u w/ onco w/in 1-2 weeks   Increase activity slowly   Complete by: As directed       Allergies as of 08/04/2023       Reactions   Antihistamines, Chlorpheniramine-type Swelling   Prostat   Diphenhydramine Hcl Swelling   Fluorescein Dermatitis, Hives, Itching, Other (See Comments)   Iodinated Contrast Media Other (See Comments)   Lisinopril Swelling   Ezetimibe Hives, Other (See Comments)   Furosemide Swelling   Metformin Hives, Other (See Comments)   Metoprolol Itching   Nsaids Other (See Comments)   Does take aspirin Other Reaction(s): Kidney Disorder   Sulfa Antibiotics Hives, Other (See Comments)   Brilinta [ticagrelor] Other (See Comments)   Headaches, off balance, no appetite   Diphenhydramine Hcl Other (See Comments)   "The more I take, it makes my prostate swell..."   Other Itching, Other (See Comments)   FA Dye Prostate swelling FA Dye Prostate swelling        Medication List     STOP taking these medications    amLODipine 10 MG tablet Commonly known as: NORVASC   cloNIDine 0.1 MG tablet Commonly known as: CATAPRES   clopidogrel 75 MG tablet Commonly  known as: PLAVIX   levofloxacin 750 MG tablet Commonly known as: LEVAQUIN   pantoprazole 40 MG tablet Commonly known as: PROTONIX       TAKE these medications    acetaminophen 325 MG tablet Commonly known as: TYLENOL Take 650 mg by mouth every 6 (six) hours as needed.   albuterol 108 (90 Base) MCG/ACT inhaler Commonly known as: VENTOLIN HFA Inhale 2 puffs into the lungs every 6 (six) hours as needed for wheezing or shortness of breath.   allopurinol 100 MG tablet Commonly known as: ZYLOPRIM Take 100 mg by mouth daily.   ALPRAZolam 0.5 MG tablet Commonly known as: XANAX Take 0.5 mg by mouth at bedtime as needed for sleep.   ammonium lactate 12 % cream Commonly known as: AMLACTIN Apply 1 Application topically 2 (two) times daily. Apply to arms, legs, and body one to two times daily   apixaban 5 MG Tabs tablet Commonly known as: ELIQUIS Take 2 tablets (10 mg total) by  mouth 2 (two) times daily for 7 days, THEN 1 tablet (5 mg total) 2 (two) times daily. Start taking on: August 04, 2023   ascorbic acid 500 MG tablet Commonly known as: VITAMIN C Take 500 mg by mouth daily.   aspirin 81 MG chewable tablet Chew 81 mg by mouth daily.   colchicine 0.6 MG tablet Take 0.6 mg by mouth as needed (gout flares).   Crestor 40 MG tablet Generic drug: rosuvastatin Take 40 mg by mouth daily.   diphenhydrAMINE 25 mg capsule Commonly known as: Benadryl Allergy Take 1 capsule (25 mg total) by mouth every 6 (six) hours as needed.   feeding supplement (GLUCERNA SHAKE) Liqd Take 237 mLs by mouth 3 (three) times daily between meals.   Fish Oil 1000 MG Caps Take 1,000 mg by mouth daily.   fluticasone 50 MCG/ACT nasal spray Commonly known as: FLONASE Place 2 sprays into both nostrils daily as needed for allergies or rhinitis.   fluticasone-salmeterol 250-50 MCG/ACT Aepb Commonly known as: ADVAIR Inhale 1 puff into the lungs 2 (two) times daily.   insulin aspart 100 UNIT/ML  injection Commonly known as: novoLOG Inject 16-18 Units into the skin 3 (three) times daily before meals. 16 units with breakfast and lunch 18 units with dinner   insulin glargine-yfgn 100 UNIT/ML injection Commonly known as: SEMGLEE Inject 0.15 mLs (15 Units total) into the skin daily. What changed:  how much to take when to take this   isosorbide mononitrate 30 MG 24 hr tablet Commonly known as: IMDUR Take 1 tablet (30 mg total) by mouth daily. Start taking on: August 05, 2023   metoprolol tartrate 50 MG tablet Commonly known as: LOPRESSOR Take 50 mg by mouth 2 (two) times daily.   multivitamin with minerals Tabs tablet Take 1 tablet by mouth daily.   nirmatrelvir/ritonavir (renal dosing) 10 x 150 MG & 10 x 100MG  Tabs Commonly known as: PAXLOVID Take 2 tablets by mouth 2 (two) times daily for 5 days. Take nirmatrelvir (150 mg) one tablet twice daily for 5 days and ritonavir (100 mg) one tablet twice daily for 5 days.   nitroGLYCERIN 0.4 MG SL tablet Commonly known as: NITROSTAT Place 0.4 mg under the tongue every 5 (five) minutes as needed for chest pain.   omeprazole 40 MG capsule Commonly known as: PRILOSEC Take 40 mg by mouth daily.   oxyCODONE 5 MG immediate release tablet Commonly known as: Oxy IR/ROXICODONE Take 1 tablet (5 mg total) by mouth every 4 (four) hours as needed for breakthrough pain.   Ozempic (2 MG/DOSE) 8 MG/3ML Sopn Generic drug: Semaglutide (2 MG/DOSE) Inject 2 mg into the skin every Thursday.   senna-docusate 8.6-50 MG tablet Commonly known as: Senokot-S Take 1 tablet by mouth 2 (two) times daily.   Synthroid 50 MCG tablet Generic drug: levothyroxine Take 50 mcg by mouth daily before breakfast.   tamsulosin 0.4 MG Caps capsule Commonly known as: FLOMAX Take 0.4 mg by mouth daily after supper.   Vitamin D (Ergocalciferol) 1.25 MG (50000 UNIT) Caps capsule Commonly known as: DRISDOL Take 50,000 Units by mouth every 30 (thirty) days.  Takes on 1st of each month        Follow-up Information     [facility]. Go on 08/18/2023.   Specialty: Wound Care Why: First avail; sacral wound care;;  Appt @ 8:00 am Contact information: [address] [phone]  Allergies  Allergen Reactions   Antihistamines, Chlorpheniramine-Type Swelling    Prostat   Diphenhydramine Hcl Swelling   Fluorescein Dermatitis, Hives, Itching and Other (See Comments)   Iodinated Contrast Media Other (See Comments)   Lisinopril Swelling   Ezetimibe Hives and Other (See Comments)   Furosemide Swelling   Metformin Hives and Other (See Comments)   Metoprolol Itching   Nsaids Other (See Comments)    Does take aspirin  Other Reaction(s): Kidney Disorder   Sulfa Antibiotics Hives and Other (See Comments)   Brilinta [Ticagrelor] Other (See Comments)    Headaches, off balance, no appetite   Diphenhydramine Hcl Other (See Comments)    "The more I take, it makes my prostate swell..."   Other Itching and Other (See Comments)    FA Dye Prostate swelling FA Dye  Prostate swelling    Consultations:   Procedures/Studies: US Venous Img Lower Bilateral  Result Date: 07/31/2023 CLINICAL DATA:  Swelling. EXAM: Bilateral LOWER EXTREMITY VENOUS DOPPLER ULTRASOUND TECHNIQUE: Gray-scale sonography with compression, as well as color and duplex ultrasound, were performed to evaluate the deep venous system(s) from the level of the common femoral vein through the popliteal and proximal calf veins. COMPARISON:  None Available. FINDINGS: VENOUS Normal compressibility of the common femoral, superficial femoral, and popliteal veins, as well as the visualized calf veins. Visualized portions of profunda femoral vein and great saphenous vein unremarkable. No filling defects to suggest DVT on grayscale or color Doppler imaging. Doppler waveforms show normal direction of venous flow, normal  respiratory plasticity and response to augmentation. Limited views of the contralateral common femoral vein are unremarkable. OTHER None. Limitations: none IMPRESSION: No evidence of bilateral lower extremity DVT Electronically Signed   By: Karen Kays M.D.   On: 07/31/2023 10:42   US Venous Img Upper Uni Left  Result Date: 07/31/2023 CLINICAL DATA:  Swelling. EXAM: Left UPPER EXTREMITY VENOUS DOPPLER ULTRASOUND TECHNIQUE: Gray-scale sonography with graded compression, as well as color Doppler and duplex ultrasound were performed to evaluate the upper extremity deep venous system from the level of the subclavian vein and including the jugular, axillary, basilic, radial, ulnar and upper cephalic vein. Spectral Doppler was utilized to evaluate flow at rest and with distal augmentation maneuvers. COMPARISON:  Chest CT 07/18/2023 FINDINGS: Contralateral Subclavian Vein: Respiratory phasicity is normal and symmetric with the symptomatic side. No evidence of thrombus. Normal compressibility. Internal Jugular Vein: No evidence of thrombus. Normal compressibility, respiratory phasicity and response to augmentation. Subclavian Vein: No evidence of thrombus. Normal compressibility, respiratory phasicity and response to augmentation. Axillary Vein: No evidence of thrombus. Normal compressibility, respiratory phasicity and response to augmentation. Cephalic Vein: Poor flow on Doppler.  No compression. Basilic Vein: Poor flow on color and spectral Doppler. No compression. Brachial Veins: Areas of thrombus in the brachial vein are seen with poor flow on color and spectral Doppler and poor compression. Radial Veins: No evidence of thrombus. Normal compressibility, respiratory phasicity and response to augmentation. Ulnar Veins: No evidence of thrombus. Normal compressibility, respiratory phasicity and response to augmentation. Venous Reflux:  None visualized. Other Findings: Known mass lesions again identified in the axillary  region measuring up to 6.5 cm as seen on prior CT scan. Critical Value/emergent results were called by telephone at the time of interpretation on 07/31/2023 at 7:40 am to provider Willy Eddy , who verbally acknowledged these results. IMPRESSION: Areas of thrombus in the left brachial vein, basilic vein and cephalic vein. Mass lesions identified in the axillary region. Please  correlate with the prior CT Electronically Signed   By: Karen Kays M.D.   On: 07/31/2023 10:41   DG Chest Portable 1 View  Result Date: 07/31/2023 CLINICAL DATA:  Shortness of breath, edema.  Evaluate for effusion. EXAM: PORTABLE CHEST 1 VIEW COMPARISON:  Chest radiograph 06/22/2023. CT chest/abdomen/pelvis 07/18/2023. FINDINGS: Low lung volumes accentuate the pulmonary vasculature and cardiomediastinal silhouette. No consolidation or pulmonary edema. Unchanged left basilar atelectasis. New small left pleural effusion. IMPRESSION: New small left pleural effusion. Electronically Signed   By: Orvan Falconer M.D.   On: 07/31/2023 08:27   CT CHEST ABDOMEN PELVIS WO CONTRAST  Result Date: 07/18/2023 CLINICAL DATA:  Sepsis flu like symptoms abdominal surgery fever EXAM: CT CHEST, ABDOMEN AND PELVIS WITHOUT CONTRAST TECHNIQUE: Multidetector CT imaging of the chest, abdomen and pelvis was performed following the standard protocol without IV contrast. RADIATION DOSE REDUCTION: This exam was performed according to the departmental dose-optimization program which includes automated exposure control, adjustment of the mA and/or kV according to patient size and/or use of iterative reconstruction technique. COMPARISON:  CT 06/23/2023 FINDINGS: CT CHEST FINDINGS Cardiovascular: Limited assessment without intravenous contrast. Moderate aortic atherosclerosis. Post CABG changes. Coronary vascular calcifications. Borderline cardiomegaly. No pericardial effusion. Mediastinum/Nodes: Midline trachea. No thyroid mass. Enlarged prevascular lymph node  measuring 17 mm series 2, image 16 increased compared to prior. Mild diffuse enlargement of multiple prevascular nodes. Esophagus within normal limits. Multiple enlarged left axillary lymph nodes with dominant left subpectoral node measuring 5.7 x 3.9 cm, previously 4.3 x 3.5 cm. Interval enlargement of multiple additional left axillary nodes with associated soft tissue stranding. Incompletely visualized left supraclavicular mass measuring at least 6.9 by 5.3 cm, series 2, image 1, also increased compared to prior. Lungs/Pleura: Emphysema. No acute airspace disease, pleural effusion, or pneumothorax. No suspicious pulmonary nodules. Musculoskeletal: Sternotomy.  No acute osseous abnormality. CT ABDOMEN PELVIS FINDINGS Hepatobiliary: No focal liver abnormality is seen. No gallstones, gallbladder wall thickening, or biliary dilatation. Pancreas: Unremarkable. No pancreatic ductal dilatation or surrounding inflammatory changes. Spleen: Normal in size without focal abnormality. Adrenals/Urinary Tract: Adrenal glands are within normal limits. Moderate nonspecific perinephric fat stranding. No hydronephrosis. The bladder is unremarkable. Stomach/Bowel: Postsurgical changes of the stomach. No dilated small bowel. No acute bowel wall thickening. Negative appendix. Vascular/Lymphatic: Moderate aortic atherosclerosis. Increased size of right Peri aortic retroperitoneal lymph node on series 2, image 52 measuring 1.6 cm. Multiple subcentimeter Peri aortic nodes mildly increased. Slight interval increase in size of multiple subcentimeter iliac nodes. Multiple enlarged right pelvic sidewall and external iliac nodes, for example 2 cm lymph node on series 2, image 105. Right external iliac node measuring 3.1 cm on series 2, image 110, previously 2.3 cm. Ill-defined right groin mass measuring 4.1 cm, previously 4.6 cm. Multiple additional mildly enlarged left groin lymph nodes. Reproductive: Slightly enlarged prostate Other:  Negative for pelvic effusion or free air. Skin thickening and subcutaneous infiltration along the anterior abdominal wall possibly due to cutaneous injection. Musculoskeletal: No acute or suspicious osseous abnormality IMPRESSION: 1. Negative for acute airspace disease.  Emphysema. 2. Increased left axillary, mediastinal, and retroperitoneal adenopathy. Increased size of incompletely visualized left supraclavicular mass/suspected adenopathy. Persistent right pelvic, external iliac and right groin adenopathy. Findings could be secondary to metastatic disease or lymphoproliferative disease. Tissue sampling if not already performed is suggested. 3. Nonspecific bilateral perinephric stranding without hydronephrosis. No convincing CT evidence for acute intra-abdominal or pelvic abnormality. Aortic Atherosclerosis (ICD10-I70.0) and Emphysema (ICD10-J43.9). Electronically Signed   By: Selena Batten  Jake Samples M.D.   On: 07/18/2023 18:07   (Echo, Carotid, EGD, Colonoscopy, ERCP)    Subjective:   Discharge Exam: Vitals:   08/03/23 2157 08/04/23 0846  BP: (!) 145/60 (!) 146/67  Pulse: 95 100  Resp: 18 18  Temp: 99.5 F (37.5 C) 99 F (37.2 C)  SpO2: 96% 100%   Vitals:   08/03/23 1351 08/03/23 1514 08/03/23 2157 08/04/23 0846  BP: (!) 143/62 (!) 133/57 (!) 145/60 (!) 146/67  Pulse: 96 96 95 100  Resp:  18 18 18   Temp:  98.9 F (37.2 C) 99.5 F (37.5 C) 99 F (37.2 C)  TempSrc:      SpO2:  99% 96% 100%    General: Pt is alert, awake, not in acute distress Cardiovascular: RRR, S1/S2 +, no rubs, no gallops Respiratory: CTA bilaterally, no wheezing, no rhonchi Abdominal: Soft, NT, ND, bowel sounds + Extremities: no edema, no cyanosis    The results of significant diagnostics from this hospitalization (including imaging, microbiology, ancillary and laboratory) are listed below for reference.     Microbiology: Recent Results (from the past 240 hour(s))  SARS Coronavirus 2 by RT PCR (hospital order,  performed in [facility] hospital lab) *cepheid single result test* Anterior Nasal Swab     Status: Abnormal   Collection Time: 07/31/23  7:39 AM   Specimen: Anterior Nasal Swab  Result Value Ref Range Status   SARS Coronavirus 2 by RT PCR POSITIVE (A) NEGATIVE Final    Comment: (NOTE) SARS-CoV-2 target nucleic acids are DETECTED  SARS-CoV-2 RNA is generally detectable in upper respiratory specimens  during the acute phase of infection.  Positive results are indicative  of the presence of the identified virus, but do not rule out bacterial infection or co-infection with other pathogens not detected by the test.  Clinical correlation with patient history and  other diagnostic information is necessary to determine patient infection status.  The expected result is negative.  Fact Sheet for Patients:   RoadLapTop.co.za   Fact Sheet for Healthcare Providers:   http://kim-miller.com/    This test is not yet approved or cleared by the [place] FDA and  has been authorized for detection and/or diagnosis of SARS-CoV-2 by FDA under an Emergency Use Authorization (EUA).  This EUA will remain in effect (meaning this test can be used) for the duration of  the COVID-19 declaration under Section 564(b)(1)  of the Act, 21 U.S.C. section 360-bbb-3(b)(1), unless the authorization is terminated or revoked sooner.   Performed at [facility], [address] [place], [place] [address]      Labs: BNP (last 3 results) Recent Labs    05/23/23 1145 07/31/23 0024  BNP 193.3* 259.1*   Basic Metabolic Panel: Recent Labs  Lab 07/31/23 0024 08/01/23 0441 08/03/23 0322 08/04/23 0525  NA 127* 132* 131* 131*  K 4.8 4.5 4.6 4.7  CL 95* 97* 99 99  CO2 23 25 25 25   GLUCOSE 156* 134* 143* 146*  BUN 33* 33* 31* 29*  CREATININE 2.20* 2.34* 2.42* 1.92*  CALCIUM 8.4* 8.4* 8.0* 7.9*   Liver Function Tests: Recent Labs  Lab 07/31/23 0024   AST 26  ALT 16  ALKPHOS 66  BILITOT 0.6  PROT 7.5  ALBUMIN 2.4*   No results for input(s): "LIPASE", "AMYLASE" in the last 168 hours. No results for input(s): "AMMONIA" in the last 168 hours. CBC: Recent Labs  Lab 07/31/23 0024 08/01/23 0441 08/03/23 0322 08/04/23 0525  WBC 28.6* 25.2*  24.1* 24.2*  NEUTROABS 24.4*  --   --   --   HGB 8.9* 8.1* 8.0* 8.0*  HCT 28.5* 25.5* 24.1* 25.0*  MCV 84.3 83.3 80.1 81.7  PLT 484* 449* 398 369   Cardiac Enzymes: No results for input(s): "CKTOTAL", "CKMB", "CKMBINDEX", "TROPONINI" in the last 168 hours. BNP: Invalid input(s): "POCBNP" CBG: Recent Labs  Lab 08/03/23 0806 08/03/23 1201 08/03/23 1639 08/03/23 2155 08/04/23 0842  GLUCAP 118* 171* 203* 221* 149*   D-Dimer No results for input(s): "DDIMER" in the last 72 hours. Hgb A1c No results for input(s): "HGBA1C" in the last 72 hours. Lipid Profile No results for input(s): "CHOL", "HDL", "LDLCALC", "TRIG", "CHOLHDL", "LDLDIRECT" in the last 72 hours. Thyroid function studies No results for input(s): "TSH", "T4TOTAL", "T3FREE", "THYROIDAB" in the last 72 hours.  Invalid input(s): "FREET3" Anemia work up No results for input(s): "VITAMINB12", "FOLATE", "FERRITIN", "TIBC", "IRON", "RETICCTPCT" in the last 72 hours. Urinalysis    Component Value Date/Time   COLORURINE YELLOW (A) 07/18/2023 1616   APPEARANCEUR CLOUDY (A) 07/18/2023 1616   LABSPEC 1.015 07/18/2023 1616   PHURINE 5.0 07/18/2023 1616   GLUCOSEU NEGATIVE 07/18/2023 1616   HGBUR NEGATIVE 07/18/2023 1616   BILIRUBINUR NEGATIVE 07/18/2023 1616   KETONESUR NEGATIVE 07/18/2023 1616   PROTEINUR 100 (A) 07/18/2023 1616   NITRITE NEGATIVE 07/18/2023 1616   LEUKOCYTESUR NEGATIVE 07/18/2023 1616   Sepsis Labs Recent Labs  Lab 07/31/23 0024 08/01/23 0441 08/03/23 0322 08/04/23 0525  WBC 28.6* 25.2* 24.1* 24.2*   Microbiology Recent Results (from the past 240 hour(s))  SARS Coronavirus 2 by RT PCR (hospital  order, performed in [facility] hospital lab) *cepheid single result test* Anterior Nasal Swab     Status: Abnormal   Collection Time: 07/31/23  7:39 AM   Specimen: Anterior Nasal Swab  Result Value Ref Range Status   SARS Coronavirus 2 by RT PCR POSITIVE (A) NEGATIVE Final    Comment: (NOTE) SARS-CoV-2 target nucleic acids are DETECTED  SARS-CoV-2 RNA is generally detectable in upper respiratory specimens  during the acute phase of infection.  Positive results are indicative  of the presence of the identified virus, but do not rule out bacterial infection or co-infection with other pathogens not detected by the test.  Clinical correlation with patient history and  other diagnostic information is necessary to determine patient infection status.  The expected result is negative.  Fact Sheet for Patients:   RoadLapTop.co.za   Fact Sheet for Healthcare Providers:   http://kim-miller.com/    This test is not yet approved or cleared by the [place] FDA and  has been authorized for detection and/or diagnosis of SARS-CoV-2 by FDA under an Emergency Use Authorization (EUA).  This EUA will remain in effect (meaning this test can be used) for the duration of  the COVID-19 declaration under Section 564(b)(1)  of the Act, 21 U.S.C. section 360-bbb-3(b)(1), unless the authorization is terminated or revoked sooner.   Performed at [facility], [address] [place], [place] [address]      Time coordinating discharge: Over 30 minutes  SIGNED:   Charise Killian, MD  [facility] 08/04/2023, 12:33 PM Pager   If 7PM-7AM, please contact night-coverage [email or web address]

## 2023-08-04 NOTE — Care Management Important Message (Signed)
Important Message  Patient Details  Name: Roberto Ingram MRN: [id number] Date of Birth: [birth year]   Medicare Important Message Given:  Yes  Patient is in an isolation room so I called his room [phone]) to review the Important Message from Medicare with but his wife Sheralyn Boatman answered and said I could review it with her. She stated she understood these rights and that physician had just been in and planned to discharge him later today. I thanked her for her time and wished him a speedy recovery.    Olegario Messier A Devany Aja 08/04/2023, 10:01 AM

## 2023-08-11 LAB — SURGICAL PATHOLOGY

## 2023-08-18 ENCOUNTER — Ambulatory Visit: Payer: PRIVATE HEALTH INSURANCE | Admitting: Physician Assistant

## 2023-08-21 LAB — FUNGUS CULTURE WITH STAIN

## 2023-08-21 LAB — FUNGUS CULTURE RESULT

## 2023-08-21 LAB — FUNGAL ORGANISM REFLEX

## 2023-09-05 LAB — ACID FAST CULTURE WITH REFLEXED SENSITIVITIES (MYCOBACTERIA): Acid Fast Culture: NEGATIVE

## 2023-10-22 ENCOUNTER — Emergency Department: Payer: Medicare Other

## 2023-10-22 DIAGNOSIS — Z7901 Long term (current) use of anticoagulants: Secondary | ICD-10-CM

## 2023-10-22 DIAGNOSIS — E782 Mixed hyperlipidemia: Secondary | ICD-10-CM | POA: Diagnosis present

## 2023-10-22 DIAGNOSIS — Z713 Dietary counseling and surveillance: Secondary | ICD-10-CM

## 2023-10-22 DIAGNOSIS — I130 Hypertensive heart and chronic kidney disease with heart failure and stage 1 through stage 4 chronic kidney disease, or unspecified chronic kidney disease: Secondary | ICD-10-CM | POA: Diagnosis not present

## 2023-10-22 DIAGNOSIS — Z841 Family history of disorders of kidney and ureter: Secondary | ICD-10-CM

## 2023-10-22 DIAGNOSIS — I509 Heart failure, unspecified: Secondary | ICD-10-CM | POA: Diagnosis not present

## 2023-10-22 DIAGNOSIS — Z85831 Personal history of malignant neoplasm of soft tissue: Secondary | ICD-10-CM

## 2023-10-22 DIAGNOSIS — I5033 Acute on chronic diastolic (congestive) heart failure: Secondary | ICD-10-CM | POA: Diagnosis present

## 2023-10-22 DIAGNOSIS — Z6829 Body mass index (BMI) 29.0-29.9, adult: Secondary | ICD-10-CM

## 2023-10-22 DIAGNOSIS — J9611 Chronic respiratory failure with hypoxia: Secondary | ICD-10-CM | POA: Diagnosis present

## 2023-10-22 DIAGNOSIS — Z86718 Personal history of other venous thrombosis and embolism: Secondary | ICD-10-CM

## 2023-10-22 DIAGNOSIS — Z825 Family history of asthma and other chronic lower respiratory diseases: Secondary | ICD-10-CM

## 2023-10-22 DIAGNOSIS — Z9981 Dependence on supplemental oxygen: Secondary | ICD-10-CM

## 2023-10-22 DIAGNOSIS — C8330 Diffuse large B-cell lymphoma, unspecified site: Secondary | ICD-10-CM | POA: Diagnosis present

## 2023-10-22 DIAGNOSIS — Z794 Long term (current) use of insulin: Secondary | ICD-10-CM

## 2023-10-22 DIAGNOSIS — D72829 Elevated white blood cell count, unspecified: Secondary | ICD-10-CM | POA: Diagnosis present

## 2023-10-22 DIAGNOSIS — D631 Anemia in chronic kidney disease: Secondary | ICD-10-CM | POA: Diagnosis present

## 2023-10-22 DIAGNOSIS — E1122 Type 2 diabetes mellitus with diabetic chronic kidney disease: Secondary | ICD-10-CM | POA: Diagnosis present

## 2023-10-22 DIAGNOSIS — K219 Gastro-esophageal reflux disease without esophagitis: Secondary | ICD-10-CM | POA: Diagnosis present

## 2023-10-22 DIAGNOSIS — N1832 Chronic kidney disease, stage 3b: Secondary | ICD-10-CM | POA: Diagnosis present

## 2023-10-22 DIAGNOSIS — E876 Hypokalemia: Secondary | ICD-10-CM | POA: Diagnosis present

## 2023-10-22 DIAGNOSIS — Z5982 Transportation insecurity: Secondary | ICD-10-CM

## 2023-10-22 DIAGNOSIS — J4489 Other specified chronic obstructive pulmonary disease: Secondary | ICD-10-CM | POA: Diagnosis present

## 2023-10-22 DIAGNOSIS — Z8673 Personal history of transient ischemic attack (TIA), and cerebral infarction without residual deficits: Secondary | ICD-10-CM

## 2023-10-22 DIAGNOSIS — E559 Vitamin D deficiency, unspecified: Secondary | ICD-10-CM | POA: Diagnosis present

## 2023-10-22 DIAGNOSIS — Z8249 Family history of ischemic heart disease and other diseases of the circulatory system: Secondary | ICD-10-CM

## 2023-10-22 DIAGNOSIS — E11319 Type 2 diabetes mellitus with unspecified diabetic retinopathy without macular edema: Secondary | ICD-10-CM | POA: Diagnosis present

## 2023-10-22 DIAGNOSIS — E669 Obesity, unspecified: Secondary | ICD-10-CM | POA: Diagnosis present

## 2023-10-22 DIAGNOSIS — E039 Hypothyroidism, unspecified: Secondary | ICD-10-CM | POA: Diagnosis present

## 2023-10-22 DIAGNOSIS — Z7951 Long term (current) use of inhaled steroids: Secondary | ICD-10-CM

## 2023-10-22 DIAGNOSIS — G4730 Sleep apnea, unspecified: Secondary | ICD-10-CM | POA: Diagnosis present

## 2023-10-22 DIAGNOSIS — Z79899 Other long term (current) drug therapy: Secondary | ICD-10-CM

## 2023-10-22 DIAGNOSIS — Z9221 Personal history of antineoplastic chemotherapy: Secondary | ICD-10-CM

## 2023-10-22 DIAGNOSIS — T451X5A Adverse effect of antineoplastic and immunosuppressive drugs, initial encounter: Secondary | ICD-10-CM | POA: Diagnosis present

## 2023-10-22 DIAGNOSIS — R7989 Other specified abnormal findings of blood chemistry: Secondary | ICD-10-CM | POA: Diagnosis present

## 2023-10-22 DIAGNOSIS — I081 Rheumatic disorders of both mitral and tricuspid valves: Secondary | ICD-10-CM | POA: Diagnosis present

## 2023-10-22 DIAGNOSIS — N400 Enlarged prostate without lower urinary tract symptoms: Secondary | ICD-10-CM | POA: Diagnosis present

## 2023-10-22 DIAGNOSIS — Z833 Family history of diabetes mellitus: Secondary | ICD-10-CM

## 2023-10-22 DIAGNOSIS — I2489 Other forms of acute ischemic heart disease: Secondary | ICD-10-CM | POA: Diagnosis present

## 2023-10-22 DIAGNOSIS — Z8616 Personal history of COVID-19: Secondary | ICD-10-CM

## 2023-10-22 DIAGNOSIS — M109 Gout, unspecified: Secondary | ICD-10-CM | POA: Diagnosis present

## 2023-10-22 DIAGNOSIS — I252 Old myocardial infarction: Secondary | ICD-10-CM

## 2023-10-22 DIAGNOSIS — H409 Unspecified glaucoma: Secondary | ICD-10-CM | POA: Diagnosis present

## 2023-10-22 DIAGNOSIS — Z7982 Long term (current) use of aspirin: Secondary | ICD-10-CM

## 2023-10-22 DIAGNOSIS — G894 Chronic pain syndrome: Secondary | ICD-10-CM | POA: Diagnosis present

## 2023-10-22 DIAGNOSIS — Z7985 Long-term (current) use of injectable non-insulin antidiabetic drugs: Secondary | ICD-10-CM

## 2023-10-22 DIAGNOSIS — Z87891 Personal history of nicotine dependence: Secondary | ICD-10-CM

## 2023-10-22 DIAGNOSIS — F411 Generalized anxiety disorder: Secondary | ICD-10-CM | POA: Diagnosis present

## 2023-10-22 DIAGNOSIS — Z79890 Hormone replacement therapy (postmenopausal): Secondary | ICD-10-CM

## 2023-10-22 DIAGNOSIS — I2510 Atherosclerotic heart disease of native coronary artery without angina pectoris: Secondary | ICD-10-CM | POA: Diagnosis present

## 2023-10-22 DIAGNOSIS — Z951 Presence of aortocoronary bypass graft: Secondary | ICD-10-CM

## 2023-10-22 DIAGNOSIS — Z5941 Food insecurity: Secondary | ICD-10-CM

## 2023-10-22 LAB — BASIC METABOLIC PANEL
Anion gap: 12 (ref 5–15)
BUN: 27 mg/dL — ABNORMAL HIGH (ref 8–23)
CO2: 25 mmol/L (ref 22–32)
Calcium: 8.2 mg/dL — ABNORMAL LOW (ref 8.9–10.3)
Chloride: 101 mmol/L (ref 98–111)
Creatinine, Ser: 2.21 mg/dL — ABNORMAL HIGH (ref 0.61–1.24)
GFR, Estimated: 31 mL/min — ABNORMAL LOW (ref >60)
Glucose, Bld: 202 mg/dL — ABNORMAL HIGH (ref 70–99)
Potassium: 3.9 mmol/L (ref 3.5–5.1)
Sodium: 138 mmol/L (ref 135–145)

## 2023-10-22 LAB — CBC
HCT: 23 % — ABNORMAL LOW (ref 39.0–52.0)
Hemoglobin: 7.3 g/dL — ABNORMAL LOW (ref 13.0–17.0)
MCH: 31.3 pg (ref 26.0–34.0)
MCHC: 31.7 g/dL (ref 30.0–36.0)
MCV: 98.7 fL (ref 80.0–100.0)
Platelets: 264 10*3/uL (ref 150–400)
RBC: 2.33 MIL/uL — ABNORMAL LOW (ref 4.22–5.81)
RDW: 19.2 % — ABNORMAL HIGH (ref 11.5–15.5)
WBC: 20.1 10*3/uL — ABNORMAL HIGH (ref 4.0–10.5)
nRBC: 0.1 % (ref 0.0–0.2)

## 2023-10-22 LAB — TROPONIN I (HIGH SENSITIVITY): Troponin I (High Sensitivity): 28 ng/L — ABNORMAL HIGH (ref <18)

## 2023-10-22 LAB — BRAIN NATRIURETIC PEPTIDE: B Natriuretic Peptide: 779.4 pg/mL — ABNORMAL HIGH (ref 0.0–100.0)

## 2023-10-22 NOTE — ED Triage Notes (Signed)
Patient states bilateral feet and left arm swelling, chest pain and shortness of breath; started 2 days ago. Currently undergoing chemo for Lymphoma.

## 2023-10-23 ENCOUNTER — Inpatient Hospital Stay
Admission: EM | Admit: 2023-10-23 | Discharge: 2023-10-28 | DRG: 291 | Disposition: A | Discharge status: Home Health | Payer: Medicare Other | Attending: Internal Medicine | Admitting: Internal Medicine

## 2023-10-23 ENCOUNTER — Telehealth (HOSPITAL_COMMUNITY): Payer: Self-pay | Admitting: Pharmacy Technician

## 2023-10-23 ENCOUNTER — Inpatient Hospital Stay
Admit: 2023-10-23 | Discharge: 2023-10-23 | Disposition: A | Discharge status: Home / Self Care | Payer: Medicare Other | Attending: Family Medicine | Admitting: Family Medicine

## 2023-10-23 ENCOUNTER — Other Ambulatory Visit (HOSPITAL_COMMUNITY): Payer: Self-pay

## 2023-10-23 ENCOUNTER — Other Ambulatory Visit: Payer: Self-pay

## 2023-10-23 ENCOUNTER — Encounter: Payer: Self-pay | Admitting: Family Medicine

## 2023-10-23 DIAGNOSIS — E669 Obesity, unspecified: Secondary | ICD-10-CM | POA: Diagnosis present

## 2023-10-23 DIAGNOSIS — E119 Type 2 diabetes mellitus without complications: Secondary | ICD-10-CM | POA: Diagnosis not present

## 2023-10-23 DIAGNOSIS — I5033 Acute on chronic diastolic (congestive) heart failure: Secondary | ICD-10-CM

## 2023-10-23 DIAGNOSIS — G894 Chronic pain syndrome: Secondary | ICD-10-CM | POA: Diagnosis present

## 2023-10-23 DIAGNOSIS — J45909 Unspecified asthma, uncomplicated: Secondary | ICD-10-CM | POA: Insufficient documentation

## 2023-10-23 DIAGNOSIS — N1832 Chronic kidney disease, stage 3b: Secondary | ICD-10-CM | POA: Diagnosis present

## 2023-10-23 DIAGNOSIS — E876 Hypokalemia: Secondary | ICD-10-CM | POA: Diagnosis present

## 2023-10-23 DIAGNOSIS — Z8616 Personal history of COVID-19: Secondary | ICD-10-CM | POA: Diagnosis not present

## 2023-10-23 DIAGNOSIS — E11319 Type 2 diabetes mellitus with unspecified diabetic retinopathy without macular edema: Secondary | ICD-10-CM | POA: Diagnosis present

## 2023-10-23 DIAGNOSIS — I081 Rheumatic disorders of both mitral and tricuspid valves: Secondary | ICD-10-CM | POA: Diagnosis present

## 2023-10-23 DIAGNOSIS — I509 Heart failure, unspecified: Secondary | ICD-10-CM | POA: Diagnosis present

## 2023-10-23 DIAGNOSIS — I10 Essential (primary) hypertension: Secondary | ICD-10-CM | POA: Diagnosis not present

## 2023-10-23 DIAGNOSIS — Z7901 Long term (current) use of anticoagulants: Secondary | ICD-10-CM | POA: Diagnosis not present

## 2023-10-23 DIAGNOSIS — D72829 Elevated white blood cell count, unspecified: Secondary | ICD-10-CM | POA: Diagnosis present

## 2023-10-23 DIAGNOSIS — Z8673 Personal history of transient ischemic attack (TIA), and cerebral infarction without residual deficits: Secondary | ICD-10-CM

## 2023-10-23 DIAGNOSIS — R7989 Other specified abnormal findings of blood chemistry: Secondary | ICD-10-CM | POA: Diagnosis present

## 2023-10-23 DIAGNOSIS — G4730 Sleep apnea, unspecified: Secondary | ICD-10-CM | POA: Diagnosis present

## 2023-10-23 DIAGNOSIS — C8330 Diffuse large B-cell lymphoma, unspecified site: Secondary | ICD-10-CM | POA: Diagnosis present

## 2023-10-23 DIAGNOSIS — Z794 Long term (current) use of insulin: Secondary | ICD-10-CM | POA: Diagnosis not present

## 2023-10-23 DIAGNOSIS — E039 Hypothyroidism, unspecified: Secondary | ICD-10-CM | POA: Insufficient documentation

## 2023-10-23 DIAGNOSIS — I2510 Atherosclerotic heart disease of native coronary artery without angina pectoris: Secondary | ICD-10-CM | POA: Diagnosis present

## 2023-10-23 DIAGNOSIS — E1122 Type 2 diabetes mellitus with diabetic chronic kidney disease: Secondary | ICD-10-CM | POA: Diagnosis present

## 2023-10-23 DIAGNOSIS — E785 Hyperlipidemia, unspecified: Secondary | ICD-10-CM | POA: Diagnosis not present

## 2023-10-23 DIAGNOSIS — J4489 Other specified chronic obstructive pulmonary disease: Secondary | ICD-10-CM | POA: Diagnosis present

## 2023-10-23 DIAGNOSIS — D631 Anemia in chronic kidney disease: Secondary | ICD-10-CM | POA: Diagnosis present

## 2023-10-23 DIAGNOSIS — I130 Hypertensive heart and chronic kidney disease with heart failure and stage 1 through stage 4 chronic kidney disease, or unspecified chronic kidney disease: Secondary | ICD-10-CM | POA: Diagnosis present

## 2023-10-23 DIAGNOSIS — E782 Mixed hyperlipidemia: Secondary | ICD-10-CM | POA: Diagnosis present

## 2023-10-23 DIAGNOSIS — J9611 Chronic respiratory failure with hypoxia: Secondary | ICD-10-CM | POA: Diagnosis present

## 2023-10-23 DIAGNOSIS — I2489 Other forms of acute ischemic heart disease: Secondary | ICD-10-CM | POA: Diagnosis present

## 2023-10-23 LAB — ABO/RH: ABO/RH(D): A POS

## 2023-10-23 LAB — RETICULOCYTES
Immature Retic Fract: 35.2 % — ABNORMAL HIGH (ref 2.3–15.9)
RBC.: 2.11 MIL/uL — ABNORMAL LOW (ref 4.22–5.81)
Retic Count, Absolute: 102.1 10*3/uL (ref 19.0–186.0)
Retic Ct Pct: 4.8 % — ABNORMAL HIGH (ref 0.4–3.1)

## 2023-10-23 LAB — BASIC METABOLIC PANEL
Anion gap: 7 (ref 5–15)
BUN: 25 mg/dL — ABNORMAL HIGH (ref 8–23)
CO2: 28 mmol/L (ref 22–32)
Calcium: 8.2 mg/dL — ABNORMAL LOW (ref 8.9–10.3)
Chloride: 104 mmol/L (ref 98–111)
Creatinine, Ser: 2 mg/dL — ABNORMAL HIGH (ref 0.61–1.24)
GFR, Estimated: 35 mL/min — ABNORMAL LOW (ref >60)
Glucose, Bld: 119 mg/dL — ABNORMAL HIGH (ref 70–99)
Potassium: 3.5 mmol/L (ref 3.5–5.1)
Sodium: 139 mmol/L (ref 135–145)

## 2023-10-23 LAB — CBC
HCT: 20.5 % — ABNORMAL LOW (ref 39.0–52.0)
Hemoglobin: 6.6 g/dL — ABNORMAL LOW (ref 13.0–17.0)
MCH: 31.6 pg (ref 26.0–34.0)
MCHC: 32.2 g/dL (ref 30.0–36.0)
MCV: 98.1 fL (ref 80.0–100.0)
Platelets: 266 10*3/uL (ref 150–400)
RBC: 2.09 MIL/uL — ABNORMAL LOW (ref 4.22–5.81)
RDW: 19.4 % — ABNORMAL HIGH (ref 11.5–15.5)
WBC: 15.1 10*3/uL — ABNORMAL HIGH (ref 4.0–10.5)
nRBC: 0.1 % (ref 0.0–0.2)

## 2023-10-23 LAB — IRON AND TIBC
Iron: 32 ug/dL — ABNORMAL LOW (ref 45–182)
Saturation Ratios: 14 % — ABNORMAL LOW (ref 17.9–39.5)
TIBC: 230 ug/dL — ABNORMAL LOW (ref 250–450)
UIBC: 198 ug/dL

## 2023-10-23 LAB — PROCALCITONIN: Procalcitonin: 0.39 ng/mL

## 2023-10-23 LAB — ECHOCARDIOGRAM COMPLETE
AR max vel: 3.55 cm2
AV Area VTI: 3.32 cm2
AV Area mean vel: 2.64 cm2
AV Mean grad: 3 mm[Hg]
AV Peak grad: 4 mm[Hg]
Ao pk vel: 1 m/s
Area-P 1/2: 4.41 cm2
MV VTI: 2.78 cm2
S' Lateral: 3.9 cm

## 2023-10-23 LAB — URINALYSIS, W/ REFLEX TO CULTURE (INFECTION SUSPECTED)
Bilirubin Urine: NEGATIVE
Glucose, UA: NEGATIVE mg/dL
Hgb urine dipstick: NEGATIVE
Ketones, ur: NEGATIVE mg/dL
Leukocytes,Ua: NEGATIVE
Nitrite: NEGATIVE
Protein, ur: 100 mg/dL — AB
Specific Gravity, Urine: 1.006 (ref 1.005–1.030)
pH: 5 (ref 5.0–8.0)

## 2023-10-23 LAB — MAGNESIUM: Magnesium: 1.5 mg/dL — ABNORMAL LOW (ref 1.7–2.4)

## 2023-10-23 LAB — TROPONIN I (HIGH SENSITIVITY): Troponin I (High Sensitivity): 37 ng/L — ABNORMAL HIGH (ref <18)

## 2023-10-23 LAB — CK: Total CK: 40 U/L — ABNORMAL LOW (ref 49–397)

## 2023-10-23 LAB — CBG MONITORING, ED
Glucose-Capillary: 199 mg/dL — ABNORMAL HIGH (ref 70–99)
Glucose-Capillary: 200 mg/dL — ABNORMAL HIGH (ref 70–99)

## 2023-10-23 LAB — PHOSPHORUS: Phosphorus: 3.2 mg/dL (ref 2.5–4.6)

## 2023-10-23 LAB — FOLATE: Folate: 14.1 ng/mL (ref >5.9)

## 2023-10-23 LAB — VITAMIN B12: Vitamin B-12: 2643 pg/mL — ABNORMAL HIGH (ref 180–914)

## 2023-10-23 MED ORDER — ACETAMINOPHEN 650 MG RE SUPP: 650.0000 mg | Freq: Four times a day (QID) | RECTAL | Status: DC | PRN

## 2023-10-23 MED ORDER — SODIUM CHLORIDE 0.9% IV SOLUTION
Freq: Once | INTRAVENOUS | Status: AC
  Filled 2023-10-23: qty 250

## 2023-10-23 MED ORDER — PANTOPRAZOLE SODIUM 40 MG IV SOLR
40.0000 mg | Freq: Two times a day (BID) | INTRAVENOUS | Status: DC
  Administered 2023-10-23 (×2): 40 mg via INTRAVENOUS
  Filled 2023-10-23 (×2): qty 10

## 2023-10-23 MED ORDER — METOPROLOL TARTRATE 25 MG PO TABS
25.0000 mg | ORAL_TABLET | Freq: Two times a day (BID) | ORAL | Status: DC
Start: 2023-10-23 — End: 2023-10-23

## 2023-10-23 MED ORDER — LEVOTHYROXINE SODIUM 50 MCG PO TABS
50.0000 ug | ORAL_TABLET | Freq: Every day | ORAL | Status: DC
  Administered 2023-10-23 – 2023-10-28 (×6): 50 ug via ORAL
  Filled 2023-10-23 (×6): qty 1

## 2023-10-23 MED ORDER — INSULIN GLARGINE-YFGN 100 UNIT/ML ~~LOC~~ SOLN: 34.0000 [IU] | Freq: Every day | SUBCUTANEOUS | Status: DC

## 2023-10-23 MED ORDER — SENNOSIDES-DOCUSATE SODIUM 8.6-50 MG PO TABS
1.0000 | ORAL_TABLET | Freq: Two times a day (BID) | ORAL | Status: DC
  Administered 2023-10-23: 1 via ORAL
  Filled 2023-10-23: qty 1

## 2023-10-23 MED ORDER — ADULT MULTIVITAMIN W/MINERALS CH
1.0000 | ORAL_TABLET | Freq: Every day | ORAL | Status: DC
  Administered 2023-10-23 – 2023-10-28 (×6): 1 via ORAL
  Filled 2023-10-23 (×6): qty 1

## 2023-10-23 MED ORDER — APIXABAN 5 MG PO TABS: 5.0000 mg | ORAL_TABLET | Freq: Two times a day (BID) | ORAL | Status: DC

## 2023-10-23 MED ORDER — FLUTICASONE PROPIONATE 50 MCG/ACT NA SUSP: 2.0000 | Freq: Every day | NASAL | Status: DC | PRN

## 2023-10-23 MED ORDER — ACETAMINOPHEN 325 MG PO TABS
650.0000 mg | ORAL_TABLET | Freq: Four times a day (QID) | ORAL | Status: DC | PRN
  Administered 2023-10-27: 650 mg via ORAL

## 2023-10-23 MED ORDER — ALLOPURINOL 100 MG PO TABS
100.0000 mg | ORAL_TABLET | Freq: Every day | ORAL | Status: DC
  Administered 2023-10-23 – 2023-10-28 (×6): 100 mg via ORAL
  Filled 2023-10-23 (×6): qty 1

## 2023-10-23 MED ORDER — BISACODYL 10 MG RE SUPP: 10.0000 mg | Freq: Every day | RECTAL | Status: DC | PRN

## 2023-10-23 MED ORDER — ALBUTEROL SULFATE HFA 108 (90 BASE) MCG/ACT IN AERS: 2.0000 | INHALATION_SPRAY | Freq: Four times a day (QID) | RESPIRATORY_TRACT | Status: DC | PRN

## 2023-10-23 MED ORDER — FUROSEMIDE 10 MG/ML IJ SOLN
4.0000 mg/h | INTRAVENOUS | Status: DC
  Filled 2023-10-23: qty 20

## 2023-10-23 MED ORDER — VITAMIN C 500 MG PO TABS
500.0000 mg | ORAL_TABLET | Freq: Every day | ORAL | Status: DC
  Administered 2023-10-23 – 2023-10-28 (×6): 500 mg via ORAL
  Filled 2023-10-23 (×6): qty 1

## 2023-10-23 MED ORDER — INSULIN GLARGINE-YFGN 100 UNIT/ML ~~LOC~~ SOLN: 68.0000 [IU] | Freq: Every day | SUBCUTANEOUS | Status: DC

## 2023-10-23 MED ORDER — NITROGLYCERIN 0.4 MG SL SUBL: 0.4000 mg | SUBLINGUAL_TABLET | SUBLINGUAL | Status: DC | PRN

## 2023-10-23 MED ORDER — ONDANSETRON HCL 4 MG/2ML IJ SOLN: 4.0000 mg | Freq: Four times a day (QID) | INTRAMUSCULAR | Status: DC | PRN

## 2023-10-23 MED ORDER — PANTOPRAZOLE SODIUM 40 MG PO TBEC: 40.0000 mg | DELAYED_RELEASE_TABLET | Freq: Every day | ORAL | Status: DC

## 2023-10-23 MED ORDER — COLCHICINE 0.6 MG PO TABS
0.6000 mg | ORAL_TABLET | ORAL | Status: DC | PRN
Start: 2023-10-23 — End: 2023-10-28

## 2023-10-23 MED ORDER — MAGNESIUM HYDROXIDE 400 MG/5ML PO SUSP
30.0000 mL | Freq: Every day | ORAL | Status: DC | PRN
Start: 2023-10-23 — End: 2023-10-28

## 2023-10-23 MED ORDER — METOPROLOL SUCCINATE ER 25 MG PO TB24
25.0000 mg | ORAL_TABLET | Freq: Two times a day (BID) | ORAL | Status: DC
Start: 2023-10-23 — End: 2023-10-28
  Administered 2023-10-23 – 2023-10-28 (×10): 25 mg via ORAL
  Filled 2023-10-23 (×10): qty 1

## 2023-10-23 MED ORDER — OMEGA-3-ACID ETHYL ESTERS 1 G PO CAPS
1.0000 g | ORAL_CAPSULE | Freq: Every day | ORAL | Status: DC
  Administered 2023-10-23 – 2023-10-28 (×6): 1 g via ORAL
  Filled 2023-10-23 (×6): qty 1

## 2023-10-23 MED ORDER — ASPIRIN 81 MG PO CHEW: 81.0000 mg | CHEWABLE_TABLET | Freq: Every day | ORAL | Status: DC

## 2023-10-23 MED ORDER — OXYCODONE HCL 5 MG PO TABS: 5.0000 mg | ORAL_TABLET | ORAL | Status: DC | PRN

## 2023-10-23 MED ORDER — INSULIN ASPART 100 UNIT/ML IJ SOLN
0.0000 [IU] | Freq: Every day | INTRAMUSCULAR | Status: DC
  Administered 2023-10-26: 2 [IU] via SUBCUTANEOUS
  Filled 2023-10-23: qty 1

## 2023-10-23 MED ORDER — BISACODYL 5 MG PO TBEC: 5.0000 mg | DELAYED_RELEASE_TABLET | Freq: Every day | ORAL | Status: DC | PRN

## 2023-10-23 MED ORDER — INSULIN GLARGINE-YFGN 100 UNIT/ML ~~LOC~~ SOLN
14.0000 [IU] | Freq: Every day | SUBCUTANEOUS | Status: DC
  Administered 2023-10-23 – 2023-10-27 (×5): 14 [IU] via SUBCUTANEOUS
  Filled 2023-10-23 (×6): qty 0.14

## 2023-10-23 MED ORDER — ALPRAZOLAM 0.5 MG PO TABS
0.5000 mg | ORAL_TABLET | Freq: Every evening | ORAL | Status: DC | PRN
  Administered 2023-10-23 – 2023-10-27 (×5): 0.5 mg via ORAL
  Filled 2023-10-23 (×5): qty 1

## 2023-10-23 MED ORDER — FUROSEMIDE 10 MG/ML IJ SOLN
40.0000 mg | Freq: Once | INTRAMUSCULAR | Status: AC
  Administered 2023-10-23: 40 mg via INTRAVENOUS
  Filled 2023-10-23: qty 4

## 2023-10-23 MED ORDER — ISOSORBIDE MONONITRATE ER 60 MG PO TB24: 30.0000 mg | ORAL_TABLET | Freq: Every day | ORAL | Status: DC

## 2023-10-23 MED ORDER — FUROSEMIDE 10 MG/ML IJ SOLN
80.0000 mg | Freq: Two times a day (BID) | INTRAMUSCULAR | Status: DC
  Administered 2023-10-23 – 2023-10-26 (×7): 80 mg via INTRAVENOUS
  Filled 2023-10-23 (×7): qty 8

## 2023-10-23 MED ORDER — METOPROLOL TARTRATE 25 MG PO TABS: 50.0000 mg | ORAL_TABLET | Freq: Two times a day (BID) | ORAL | Status: DC

## 2023-10-23 MED ORDER — INSULIN ASPART 100 UNIT/ML IJ SOLN
0.0000 [IU] | Freq: Three times a day (TID) | INTRAMUSCULAR | Status: DC
Start: 2023-10-23 — End: 2023-10-28
  Administered 2023-10-23 – 2023-10-25 (×2): 2 [IU] via SUBCUTANEOUS
  Administered 2023-10-26: 1 [IU] via SUBCUTANEOUS
  Administered 2023-10-26: 3 [IU] via SUBCUTANEOUS
  Administered 2023-10-27 – 2023-10-28 (×4): 1 [IU] via SUBCUTANEOUS
  Filled 2023-10-23 (×8): qty 1

## 2023-10-23 MED ORDER — ROSUVASTATIN CALCIUM 10 MG PO TABS
40.0000 mg | ORAL_TABLET | Freq: Every day | ORAL | Status: DC
  Administered 2023-10-23 – 2023-10-28 (×6): 40 mg via ORAL
  Filled 2023-10-23: qty 4
  Filled 2023-10-23 (×2): qty 2
  Filled 2023-10-23 (×3): qty 4

## 2023-10-23 MED ORDER — DIPHENHYDRAMINE HCL 25 MG PO CAPS: 25.0000 mg | ORAL_CAPSULE | Freq: Four times a day (QID) | ORAL | Status: DC | PRN

## 2023-10-23 MED ORDER — POLYETHYLENE GLYCOL 3350 17 G PO PACK
17.0000 g | PACK | Freq: Two times a day (BID) | ORAL | Status: DC
  Administered 2023-10-23 – 2023-10-28 (×5): 17 g via ORAL
  Filled 2023-10-23 (×8): qty 1

## 2023-10-23 MED ORDER — ONDANSETRON HCL 4 MG PO TABS
4.0000 mg | ORAL_TABLET | Freq: Four times a day (QID) | ORAL | Status: DC | PRN
  Filled 2023-10-23: qty 1

## 2023-10-23 MED ORDER — METOPROLOL TARTRATE 25 MG PO TABS
50.0000 mg | ORAL_TABLET | Freq: Once | ORAL | Status: AC
  Administered 2023-10-23: 25 mg via ORAL
  Filled 2023-10-23: qty 2

## 2023-10-23 MED ORDER — MAGNESIUM SULFATE 2 GM/50ML IV SOLN
2.0000 g | Freq: Once | INTRAVENOUS | Status: AC
  Administered 2023-10-23: 2 g via INTRAVENOUS
  Filled 2023-10-23: qty 50

## 2023-10-23 MED ORDER — ALBUTEROL SULFATE (2.5 MG/3ML) 0.083% IN NEBU: 2.5000 mg | INHALATION_SOLUTION | Freq: Four times a day (QID) | RESPIRATORY_TRACT | Status: DC | PRN

## 2023-10-23 MED ORDER — TRAZODONE HCL 50 MG PO TABS
25.0000 mg | ORAL_TABLET | Freq: Every evening | ORAL | Status: DC | PRN
  Administered 2023-10-24: 25 mg via ORAL
  Filled 2023-10-23: qty 1

## 2023-10-23 NOTE — Telephone Encounter (Signed)
Pharmacy Patient Advocate Encounter  Insurance verification completed.    The patient is insured through [employer]. Patient has Medicare and is not eligible for a copay card, but may be able to apply for patient assistance, if available.    Ran test claim for Jardiance and the current 30 day co-pay is 143.45. an test claim for Marcelline Deist and the current 30 day co-pay is 15.00.   This test claim was processed through [facility]- copay amounts may vary at other pharmacies due to pharmacy/plan contracts, or as the patient moves through the different stages of their insurance plan.

## 2023-10-23 NOTE — Assessment & Plan Note (Signed)
-   This is based on a 2D echo with EF of 60 to 65% and grade 1 diastolic dysfunction in 2018. - The patient be admitted to a progressive unit. - Will continue diuresis with IV Lasix. - Will obtain a 2D echo for further assessment.

## 2023-10-23 NOTE — ED Provider Notes (Signed)
[facility] Provider Note    Event Date/Time   First MD Initiated Contact with Patient 10/23/23 0009     (approximate)   History   Leg Swelling   HPI  Roberto Ingram is a 72 y.o. male with history of diabetes, hypertension, hypothyroidism, stroke who presents to the emergency department complaints of chest discomfort, shortness of breath with exertion, orthopnea, increasing leg swelling.  States he is not sure if he has a history of CHF but is on Lasix 80 mg daily and has been taking it as prescribed.  States symptoms progressively worsening.  No fevers or cough.  Also has history of lymphoma.  Last chemotherapy was on November 25.   History provided by patient, wife.    Past Medical History:  Diagnosis Date   Anemia    Anxiety    Generalized anxiety disorder   Asthmatic bronchitis    Atopic dermatitis    BPH (benign prostatic hyperplasia)    Cardiovascular disease    Carotid disease, bilateral (HCC)    Colon polyp    Constipation    Coronary artery disease    Diabetes mellitus without complication (HCC)    Diabetic retinopathy (HCC)    GERD (gastroesophageal reflux disease)    Glaucoma of left eye    Gout    History of GI bleed    Hypercholesterolemia    Hypertension    Hypomagnesemia    Hypothyroidism    Insomnia    Mixed hyperlipidemia    Myocardial infarction (HCC)    Pneumonia    Renal disorder    Renal insufficiency    Sleep apnea    wears oxygen at night (unable to tolerate CPAP)   Stroke (HCC)    Stroke [facility])    Thyroid disease    Vitamin D deficiency     Past Surgical History:  Procedure Laterality Date   COLONOSCOPY     COLONOSCOPY WITH PROPOFOL N/A 02/24/2018   Procedure: COLONOSCOPY WITH PROPOFOL;  Surgeon: Toledo, Boykin Nearing, MD;  Location: [facility] ENDOSCOPY;  Service: Gastroenterology;  Laterality: N/A;   COLONOSCOPY WITH PROPOFOL N/A 08/06/2022   Procedure: COLONOSCOPY WITH PROPOFOL;  Surgeon: Regis Bill,  MD;  Location: [facility] ENDOSCOPY;  Service: Gastroenterology;  Laterality: N/A;   CORONARY ANGIOPLASTY     CORONARY ARTERY BYPASS GRAFT     ESOPHAGOGASTRODUODENOSCOPY     EXPLORATION POST OPERATIVE OPEN HEART     EYE SURGERY     Lens eye surgery   INGUINAL LYMPH NODE BIOPSY Left 07/22/2023   Procedure: LEFT AXILLARY LYMPH NODE BIOPSY;  Surgeon: Carolan Shiver, MD;  Location: [facility] ORS;  Service: General;  Laterality: Left;   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 Authorizing Provider  acetaminophen (TYLENOL) 325 MG tablet Take 650 mg by mouth every 6 (six) hours as needed. 06/09/23   [provider]  albuterol (VENTOLIN HFA) 108 (90 Base) MCG/ACT inhaler Inhale 2 puffs into the lungs every 6 (six) hours as needed for wheezing or shortness of breath. 07/24/23   Jonah Blue, MD  allopurinol (ZYLOPRIM) 100 MG tablet Take 100 mg by mouth daily.    [provider]  ALPRAZolam Prudy Feeler) 0.5 MG tablet Take 0.5 mg by mouth at bedtime as needed for  sleep.     [provider]  ammonium lactate (AMLACTIN) 12 % cream Apply 1 Application topically 2 (two) times daily. Apply to arms, legs, and body one to two times daily 06/12/23   [provider]  apixaban (ELIQUIS) 5 MG TABS tablet Take 2 tablets (10 mg total) by mouth 2 (two) times daily for 7 days, THEN 1 tablet (5 mg total) 2 (two) times daily. 08/04/23 11/09/23  Charise Killian, MD  ascorbic acid (VITAMIN C) 500 MG tablet Take 500 mg by mouth daily.    [provider]  aspirin 81 MG chewable tablet Chew 81 mg by mouth daily.     [provider]  colchicine 0.6 MG tablet Take 0.6 mg by mouth as needed (gout flares). 05/13/23   [provider]  diphenhydrAMINE (BENADRYL ALLERGY) 25 mg  capsule Take 1 capsule (25 mg total) by mouth every 6 (six) hours as needed. 05/23/23   Dionne Bucy, MD  fluticasone (FLONASE) 50 MCG/ACT nasal spray Place 2 sprays into both nostrils daily as needed for allergies or rhinitis.    [provider]  fluticasone-salmeterol (ADVAIR) 250-50 MCG/ACT AEPB Inhale 1 puff into the lungs 2 (two) times daily.    [provider]  insulin aspart (NOVOLOG) 100 UNIT/ML injection Inject 16-18 Units into the skin 3 (three) times daily before meals. 16 units with breakfast and lunch 18 units with dinner    [provider]  insulin glargine-yfgn (SEMGLEE) 100 UNIT/ML injection Inject 0.15 mLs (15 Units total) into the skin daily. Patient taking differently: Inject 68 Units into the skin at bedtime. 07/25/23   Jonah Blue, MD  isosorbide mononitrate (IMDUR) 30 MG 24 hr tablet Take 1 tablet (30 mg total) by mouth daily. 08/05/23 09/04/23  Charise Killian, MD  levothyroxine (SYNTHROID) 50 MCG tablet Take 50 mcg by mouth daily before breakfast.  01/12/14   [provider]  metoprolol tartrate (LOPRESSOR) 50 MG tablet Take 50 mg by mouth 2 (two) times daily. 03/28/14   [provider]  Multiple Vitamin (MULTIVITAMIN WITH MINERALS) TABS tablet Take 1 tablet by mouth daily. 07/25/23   Jonah Blue, MD  nitroGLYCERIN (NITROSTAT) 0.4 MG SL tablet Place 0.4 mg under the tongue every 5 (five) minutes as needed for chest pain.     [provider]  Omega-3 Fatty Acids (FISH OIL) 1000 MG CAPS Take 1,000 mg by mouth daily.     [provider]  omeprazole (PRILOSEC) 40 MG capsule Take 40 mg by mouth daily. 07/21/23   [provider]  oxyCODONE (OXY IR/ROXICODONE) 5 MG immediate release tablet Take 1 tablet (5 mg total) by mouth every 4 (four) hours as needed for breakthrough pain. 07/24/23   Jonah Blue, MD  rosuvastatin (CRESTOR) 40 MG tablet Take 40 mg by mouth daily.  03/29/14   [provider]  Semaglutide, 2 MG/DOSE, (OZEMPIC, 2 MG/DOSE,) 8 MG/3ML SOPN Inject 2 mg into the skin every Thursday.    [provider]  senna-docusate (SENOKOT-S) 8.6-50 MG tablet Take 1 tablet by mouth 2 (two) times daily. 07/24/23   Jonah Blue, MD  tamsulosin (FLOMAX) 0.4 MG CAPS capsule Take 0.4 mg by mouth daily after supper.     [provider]  Vitamin D, Ergocalciferol, (DRISDOL) 50000 units CAPS capsule Take 50,000 Units by mouth every 30 (thirty) days. Takes on 1st of each month    [provider]  mometasone (NASONEX) 50 MCG/ACT nasal spray Place into the nose.  09/11/16  [provider]    Physical Exam   Triage Vital Signs: ED Triage Vitals  Encounter Vitals Group     BP 10/22/23 1701 (!) 140/62     Systolic BP Percentile --      Diastolic BP Percentile --      Pulse Rate 10/22/23 1701 92     Resp 10/22/23 1701 18     Temp 10/22/23 1701 98.2 F (36.8 C)     Temp Source 10/22/23 1701 Oral     SpO2 10/22/23 1701 93 %     Weight 10/22/23 1659 218 lb (98.9 kg)     Height 10/22/23 1659 6' (1.829 m)     Head Circumference --      Peak Flow --      Pain Score --      Pain Loc --      Pain Education --      Exclude from Growth Chart --     Most recent vital signs: Vitals:   10/22/23 1701 10/23/23 0033  BP: (!) 140/62 132/65  Pulse: 92 90  Resp: 18 (!) 21  Temp: 98.2 F (36.8 C) 98.4 F (36.9 C)  SpO2: 93% 99%    CONSTITUTIONAL: Alert, responds appropriately to questions.  Elderly, chronically ill-appearing HEAD: Normocephalic, atraumatic EYES: Conjunctivae clear, pupils appear equal, sclera nonicteric ENT: normal nose; moist mucous membranes NECK: Supple, normal ROM CARD: RRR; S1 and S2 appreciated RESP: Normal chest excursion without splinting or tachypnea; breath sounds clear and equal bilaterally; no wheezes, no rhonchi, no rales, no hypoxia or respiratory distress, speaking full sentences ABD/GI: Non-distended; soft, non-tender,  no rebound, no guarding, no peritoneal signs BACK: The back appears normal EXT: Normal ROM in all joints; no deformity noted, edema in bilateral lower extremities SKIN: Normal color for age and race; warm; no rash on exposed skin NEURO: Moves all extremities equally, normal speech PSYCH: The patient's mood and manner are appropriate.   ED Results / Procedures / Treatments   LABS: (all labs ordered are listed, but only abnormal results are displayed) Labs Reviewed  BASIC METABOLIC PANEL - Abnormal; Notable for the following components:      Result Value   Glucose, Bld 202 (*)    BUN 27 (*)    Creatinine, Ser 2.21 (*)    Calcium 8.2 (*)    GFR, Estimated 31 (*)    All other components within normal limits  CBC - Abnormal; Notable for the following components:   WBC 20.1 (*)    RBC 2.33 (*)    Hemoglobin 7.3 (*)    HCT 23.0 (*)    RDW 19.2 (*)    All other components within normal limits  BRAIN NATRIURETIC PEPTIDE - Abnormal; Notable for the following components:   B Natriuretic Peptide 779.4 (*)    All other components within normal limits  TROPONIN I (HIGH SENSITIVITY) - Abnormal; Notable for the following components:   Troponin I (High Sensitivity) 28 (*)    All other components within normal limits  TROPONIN I (HIGH SENSITIVITY) - Abnormal; Notable for the following components:   Troponin I (High Sensitivity) 37 (*)    All other components within normal limits     EKG:  EKG Interpretation Date/Time:  Wednesday October 22 2023 17:10:51 EST Ventricular Rate:  89 PR Interval:  182 QRS Duration:  86 QT Interval:  350 QTC Calculation: 425 R Axis:   -16  Text Interpretation: Normal sinus rhythm Left ventricular hypertrophy with  repolarization abnormality ( R in aVL , Cornell product , Romhilt-Estes ) Lateral infarct (cited on or before 18-Jul-2023) Inferior infarct , age undetermined Abnormal ECG When compared with ECG of 18-Jul-2023 13:42, Significant changes have  occurred Confirmed by Rochele Raring [phone]) on 10/23/2023 12:09:43 AM         EKG Interpretation Date/Time:  Thursday October 23 2023 00:21:08 EST Ventricular Rate:  101 PR Interval:  162 QRS Duration:  86 QT Interval:  342 QTC Calculation: 443 R Axis:   -21  Text Interpretation: Sinus tachycardia with Premature atrial complexes Moderate voltage criteria for LVH, may be normal variant ( R in aVL , Cornell product ) Inferior infarct (cited on or before 18-Jul-2023) Anterolateral infarct (cited on or before 18-Jul-2023) Abnormal ECG When compared with ECG of 22-Oct-2023 17:10, Premature atrial complexes are now Present Nonspecific T wave abnormality has replaced inverted T waves in Inferior leads Confirmed by Rochele Raring [phone]) on 10/23/2023 3:01:40 AM         RADIOLOGY: My personal review and interpretation of imaging: Chest x-ray shows pulmonary edema.  I have personally reviewed all radiology reports.   DG Chest 2 View Result Date: 10/22/2023 CLINICAL DATA:  Chest pain EXAM: CHEST - 2 VIEW COMPARISON:  X-ray 07/31/2023. FINDINGS: Enlarged cardiopericardial silhouette with vascular congestion. Mild edema. Small bilateral pleural effusions. No pneumothorax. No consolidation. Degenerative changes seen along the spine. IMPRESSION: Postop chest with enlarged heart and vascular congestion. Mild edema suggested small effusions. Electronically Signed   By: Karen Kays M.D.   On: 10/22/2023 18:45     PROCEDURES:  Critical Care performed: No     .1-3 Lead EKG Interpretation  Performed by: Edwinna Rochette, Layla Maw, DO Authorized by: Javaun Dimperio, Layla Maw, DO     Interpretation: normal     ECG rate:  90   ECG rate assessment: normal     Rhythm: sinus rhythm     Ectopy: none     Conduction: normal       IMPRESSION / MDM / ASSESSMENT AND PLAN / ED COURSE  I reviewed the triage vital signs and the nursing notes.    Patient here with chest pain, shortness of breath.  Possible history  of CHF.  On Lasix and reports compliance but continues to have swelling.  The patient is on the cardiac monitor to evaluate for evidence of arrhythmia and/or significant heart rate changes.   DIFFERENTIAL DIAGNOSIS (includes but not limited to):   CHF exacerbation, nephrotic syndrome, renal failure, ACS, PE   Patient's presentation is most consistent with acute presentation with potential threat to life or bodily function.   PLAN: Patient's labs show chronic kidney disease which is stable.  Also has a leukocytosis which is stable.  Hemoglobin of 7.3 which is slowly downtrending.  He has no known history of CAD.  Troponin slightly elevated okay but flat.  BNP is 779.  Chest x-ray shows pulmonary edema and effusions.  Will give IV Lasix and admit.   MEDICATIONS GIVEN IN ED: Medications  furosemide (LASIX) injection 40 mg (40 mg Intravenous Given 10/23/23 0038)  metoprolol tartrate (LOPRESSOR) tablet 50 mg (25 mg Oral Given 10/23/23 0301)     ED COURSE: Repeat EKG shows ischemic changes in inferior leads that are old compared to previous.   CONSULTS:  Consulted and discussed patient's case with hospitalist, Dr. Arville Care.  I have recommended admission and consulting physician agrees and will place admission orders.  Patient (and family if present) agree with this plan.  I reviewed all nursing notes, vitals, pertinent previous records.  All labs, EKGs, imaging ordered have been independently reviewed and interpreted by myself.    OUTSIDE RECORDS REVIEWED: Reviewed previous records from [place].       FINAL CLINICAL IMPRESSION(S) / ED DIAGNOSES   Final diagnoses:  Acute on chronic congestive heart failure, unspecified heart failure type (HCC)     Rx / DC Orders   ED Discharge Orders     None        Note:  This document was prepared using Dragon voice recognition software and may include unintentional dictation errors.   Eberardo Demello, Layla Maw, DO 10/23/23 [phone]

## 2023-10-23 NOTE — Assessment & Plan Note (Signed)
-   We will continue Synthroid. 

## 2023-10-23 NOTE — H&P (Signed)
[place]   PATIENT NAME: Roberto Ingram    MR#:  [id number]  DATE OF BIRTH:  27-Sep-1951  DATE OF ADMISSION:  10/23/2023  PRIMARY CARE PHYSICIAN: Erasmo Downer, NP   Patient is coming from: Home  REQUESTING/REFERRING PHYSICIAN: Ward, Layla Maw, DO  CHIEF COMPLAINT:   Chief Complaint  Patient presents with   Leg Swelling    HISTORY OF PRESENT ILLNESS:  Roberto Ingram is a 72 y.o. African-American male with medical history significant for multiple medical problems that are mentioned below, who presented to the emergency room with onset of midsternal chest pain mild in intensity with no radiation as well as dyspnea, orthopnea and worsening lower extremity edema.  She admitted to dry cough with occasional wheezing.  No paroxysmal nocturnal dyspnea.  No fever or chills.  No dysuria, oliguria or hematuria or flank pain.  No bleeding diathesis.  ED Course: When she comes to the ER when she came to the ER BP was 140/62 with otherwise normal vital signs.  Labs revealed blood glucose of 202 and a BUN of 27 and creatinine of 2.21.  BNP was 779.4 with a sensitive troponin I of 28 and later 37.  CBC showed leukocytosis 20.1 and anemia slightly worse than previous levels. EKG as reviewed by me : ..  EKG showed sinus rhythm with rate of 89 with LVH, lateral T wave inversion with ST segment depression. Imaging: Two-view chest x-ray showed cardiomegaly and vascular congestion with mild edema and suggested small pleural effusions.  The patient was given 40 mg of IV Lasix and 50 mg p.o. Lopressor.  He will be admitted to a progressive bed for further evaluation and management. PAST MEDICAL HISTORY:   Past Medical History:  Diagnosis Date   Anemia    Anxiety    Generalized anxiety disorder   Asthmatic bronchitis    Atopic dermatitis    BPH (benign prostatic hyperplasia)    Cardiovascular disease    Carotid disease, bilateral (HCC)    Colon polyp    Constipation    Coronary artery  disease    Diabetes mellitus without complication (HCC)    Diabetic retinopathy (HCC)    GERD (gastroesophageal reflux disease)    Glaucoma of left eye    Gout    History of GI bleed    Hypercholesterolemia    Hypertension    Hypomagnesemia    Hypothyroidism    Insomnia    Mixed hyperlipidemia    Myocardial infarction (HCC)    Pneumonia    Renal disorder    Renal insufficiency    Sleep apnea    wears oxygen at night (unable to tolerate CPAP)   Stroke (HCC)    Stroke [facility])    Thyroid disease    Vitamin D deficiency     PAST SURGICAL HISTORY:   Past Surgical History:  Procedure Laterality Date   COLONOSCOPY     COLONOSCOPY WITH PROPOFOL N/A 02/24/2018   Procedure: COLONOSCOPY WITH PROPOFOL;  Surgeon: Toledo, Boykin Nearing, MD;  Location: [facility] ENDOSCOPY;  Service: Gastroenterology;  Laterality: N/A;   COLONOSCOPY WITH PROPOFOL N/A 08/06/2022   Procedure: COLONOSCOPY WITH PROPOFOL;  Surgeon: Regis Bill, MD;  Location: [facility] ENDOSCOPY;  Service: Gastroenterology;  Laterality: N/A;   CORONARY ANGIOPLASTY     CORONARY ARTERY BYPASS GRAFT     ESOPHAGOGASTRODUODENOSCOPY     EXPLORATION POST OPERATIVE OPEN HEART     EYE SURGERY     Lens eye surgery  INGUINAL LYMPH NODE BIOPSY Left 07/22/2023   Procedure: LEFT AXILLARY LYMPH NODE BIOPSY;  Surgeon: Carolan Shiver, MD;  Location: [facility] ORS;  Service: General;  Laterality: Left;   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)   07/2023   NASAL POLYP EXCISION  1994   POLYPECTOMY     Quadruple bypass      SOCIAL HISTORY:   Social History   Tobacco Use   Smoking status: Former    Current packs/day: 0.00    Types: Cigarettes    Quit date: 06/11/1993    Years since quitting: 30.3   Smokeless tobacco: Never  Substance Use Topics   Alcohol use: No    FAMILY HISTORY:   Family History  Problem Relation Age of Onset   Atrial fibrillation Mother    Heart disease Mother    Stroke Mother     Bell's palsy Mother    Kidney disease Mother    Cancer Mother    Diabetes Father    Arthritis Father    Stroke Brother    Diabetes Brother    Lung cancer Brother    Heart attack Brother    Cancer Brother    Heart disease Sister    COPD Sister    Throat cancer Sister     DRUG ALLERGIES:   Allergies  Allergen Reactions   Antihistamines, Chlorpheniramine-Type Swelling    Prostat   Diphenhydramine Hcl Swelling   Fluorescein Dermatitis, Hives, Itching and Other (See Comments)   Iodinated Contrast Media Other (See Comments)   Lisinopril Swelling   Ezetimibe Hives and Other (See Comments)   Furosemide Swelling   Metformin Hives and Other (See Comments)   Metoprolol Itching   Nsaids Other (See Comments)    Does take aspirin  Other Reaction(s): Kidney Disorder   Sulfa Antibiotics Hives and Other (See Comments)   Brilinta [Ticagrelor] Other (See Comments)    Headaches, off balance, no appetite   Diphenhydramine Hcl Other (See Comments)    "The more I take, it makes my prostate swell..."   Other Itching and Other (See Comments)    FA Dye Prostate swelling FA Dye  Prostate swelling    REVIEW OF SYSTEMS:   ROS As per history of present illness. All pertinent systems were reviewed above. Constitutional, HEENT, cardiovascular, respiratory, GI, GU, musculoskeletal, neuro, psychiatric, endocrine, integumentary and hematologic systems were reviewed and are otherwise negative/unremarkable except for positive findings mentioned above in the HPI.   MEDICATIONS AT HOME:   Prior to Admission medications   Medication Sig Start Date End Date Taking? Authorizing Provider  acetaminophen (TYLENOL) 325 MG tablet Take 650 mg by mouth every 6 (six) hours as needed. 06/09/23   [provider]  albuterol (VENTOLIN HFA) 108 (90 Base) MCG/ACT inhaler Inhale 2 puffs into the lungs every 6 (six) hours as needed for wheezing or shortness of breath. 07/24/23   Jonah Blue, MD   allopurinol (ZYLOPRIM) 100 MG tablet Take 100 mg by mouth daily.    [provider]  ALPRAZolam Prudy Feeler) 0.5 MG tablet Take 0.5 mg by mouth at bedtime as needed for sleep.     [provider]  ammonium lactate (AMLACTIN) 12 % cream Apply 1 Application topically 2 (two) times daily. Apply to arms, legs, and body one to two times daily 06/12/23   [provider]  apixaban (ELIQUIS) 5 MG TABS tablet Take 2 tablets (10 mg total) by mouth 2 (two) times daily for 7 days, THEN 1 tablet (5  mg total) 2 (two) times daily. 08/04/23 11/09/23  Charise Killian, MD  ascorbic acid (VITAMIN C) 500 MG tablet Take 500 mg by mouth daily.    [provider]  aspirin 81 MG chewable tablet Chew 81 mg by mouth daily.     [provider]  colchicine 0.6 MG tablet Take 0.6 mg by mouth as needed (gout flares). 05/13/23   [provider]  diphenhydrAMINE (BENADRYL ALLERGY) 25 mg capsule Take 1 capsule (25 mg total) by mouth every 6 (six) hours as needed. 05/23/23   Dionne Bucy, MD  fluticasone (FLONASE) 50 MCG/ACT nasal spray Place 2 sprays into both nostrils daily as needed for allergies or rhinitis.    [provider]  fluticasone-salmeterol (ADVAIR) 250-50 MCG/ACT AEPB Inhale 1 puff into the lungs 2 (two) times daily.    [provider]  insulin aspart (NOVOLOG) 100 UNIT/ML injection Inject 16-18 Units into the skin 3 (three) times daily before meals. 16 units with breakfast and lunch 18 units with dinner    [provider]  insulin glargine-yfgn (SEMGLEE) 100 UNIT/ML injection Inject 0.15 mLs (15 Units total) into the skin daily. Patient taking differently: Inject 68 Units into the skin at bedtime. 07/25/23   Jonah Blue, MD  isosorbide mononitrate (IMDUR) 30 MG 24 hr tablet Take 1 tablet (30 mg total) by mouth daily. 08/05/23 09/04/23  Charise Killian, MD  levothyroxine (SYNTHROID) 50 MCG tablet Take 50 mcg by mouth daily before  breakfast.  01/12/14   [provider]  metoprolol tartrate (LOPRESSOR) 50 MG tablet Take 50 mg by mouth 2 (two) times daily. 03/28/14   [provider]  Multiple Vitamin (MULTIVITAMIN WITH MINERALS) TABS tablet Take 1 tablet by mouth daily. 07/25/23   Jonah Blue, MD  nitroGLYCERIN (NITROSTAT) 0.4 MG SL tablet Place 0.4 mg under the tongue every 5 (five) minutes as needed for chest pain.     [provider]  Omega-3 Fatty Acids (FISH OIL) 1000 MG CAPS Take 1,000 mg by mouth daily.     [provider]  omeprazole (PRILOSEC) 40 MG capsule Take 40 mg by mouth daily. 07/21/23   [provider]  oxyCODONE (OXY IR/ROXICODONE) 5 MG immediate release tablet Take 1 tablet (5 mg total) by mouth every 4 (four) hours as needed for breakthrough pain. 07/24/23   Jonah Blue, MD  rosuvastatin (CRESTOR) 40 MG tablet Take 40 mg by mouth daily.  03/29/14   [provider]  Semaglutide, 2 MG/DOSE, (OZEMPIC, 2 MG/DOSE,) 8 MG/3ML SOPN Inject 2 mg into the skin every Thursday.    [provider]  senna-docusate (SENOKOT-S) 8.6-50 MG tablet Take 1 tablet by mouth 2 (two) times daily. 07/24/23   Jonah Blue, MD  tamsulosin (FLOMAX) 0.4 MG CAPS capsule Take 0.4 mg by mouth daily after supper.     [provider]  Vitamin D, Ergocalciferol, (DRISDOL) 50000 units CAPS capsule Take 50,000 Units by mouth every 30 (thirty) days. Takes on 1st of each month    [provider]  mometasone (NASONEX) 50 MCG/ACT nasal spray Place into the nose.  09/11/16  [provider]      VITAL SIGNS:  Blood pressure 123/64, pulse 82, temperature 98 F (36.7 C), temperature source Oral, resp. rate (!) 22, height 6' (1.829 m), weight 98.9 kg, SpO2 99%.  PHYSICAL EXAMINATION:  Physical Exam  GENERAL:  72 y.o.-year-old African-American patient semi-lying in the bed with mild respiratory distress with conversational dyspnea. EYES: Pupils equal,  round,  reactive to light and accommodation. No scleral icterus. Extraocular muscles intact.  HEENT: Head atraumatic, normocephalic. Oropharynx and nasopharynx clear.  NECK:  Supple, no jugular venous distention. No thyroid enlargement, no tenderness.  LUNGS: Diminished bibasal breath sounds with bibasal rales.  No use of accessory muscles of respiration.  CARDIOVASCULAR: Regular rate and rhythm, S1, S2 normal. No murmurs, rubs, or gallops.  ABDOMEN: Soft, nondistended, nontender. Bowel sounds present. No organomegaly or mass.  EXTREMITIES: 1+ bilateral lower extremity pitting edema with no cyanosis, or clubbing.  NEUROLOGIC: Cranial nerves II through XII are intact. Muscle strength 5/5 in all extremities. Sensation intact. Gait not checked.  PSYCHIATRIC: The patient is alert and oriented x 3.  Normal affect and good eye contact. SKIN: No obvious rash, lesion, or ulcer.   LABORATORY PANEL:   CBC Recent Labs  Lab 10/23/23 0500  WBC 15.1*  HGB 6.6*  HCT 20.5*  PLT 266   ------------------------------------------------------------------------------------------------------------------  Chemistries  Recent Labs  Lab 10/23/23 0500  NA 139  K 3.5  CL 104  CO2 28  GLUCOSE 119*  BUN 25*  CREATININE 2.00*  CALCIUM 8.2*   ------------------------------------------------------------------------------------------------------------------  Cardiac Enzymes No results for input(s): "TROPONINI" in the last 168 hours. ------------------------------------------------------------------------------------------------------------------  RADIOLOGY:  DG Chest 2 View Result Date: 10/22/2023 CLINICAL DATA:  Chest pain EXAM: CHEST - 2 VIEW COMPARISON:  X-ray 07/31/2023. FINDINGS: Enlarged cardiopericardial silhouette with vascular congestion. Mild edema. Small bilateral pleural effusions. No pneumothorax. No consolidation. Degenerative changes seen along the spine. IMPRESSION: Postop chest with enlarged  heart and vascular congestion. Mild edema suggested small effusions. Electronically Signed   By: Karen Kays M.D.   On: 10/22/2023 18:45      IMPRESSION AND PLAN:  Assessment and Plan: * Acute on chronic diastolic CHF (congestive heart failure) (HCC) - This is based on a 2D echo with EF of 60 to 65% and grade 1 diastolic dysfunction in 2018. - The patient be admitted to a progressive unit. - Will continue diuresis with IV Lasix. - Will obtain a 2D echo for further assessment.  Dyslipidemia - We will continue statin therapy.  Hypothyroidism - We will continue Synthroid.  Type 2 diabetes mellitus without complication, with long-term current use of insulin (HCC) - The patient will be placed on supplement coverage with NovoLog. - We will continue basal coverage.  Asthma, chronic - We will continue his albuterol and hold off long-acting beta agonist.  Essential hypertension - We will continue antihypertensive therapy.       DVT prophylaxis: Lovenox. Advanced Care Planning:  Code Status: full code. Family Communication:  The plan of care was discussed in details with the patient (and family). I answered all questions. The patient agreed to proceed with the above mentioned plan. Further management will depend upon hospital course. Disposition Plan: Back to previous home environment Consults called: none.  All the records are reviewed and case discussed with ED provider.  Status is: Inpatient   At the time of the admission, it appears that the appropriate admission status for this patient is inpatient.  This is judged to be reasonable and necessary in order to provide the required intensity of service to ensure the patient's safety given the presenting symptoms, physical exam findings and initial radiographic and laboratory data in the context of comorbid conditions.  The patient requires inpatient status due to high intensity of service, high risk of further deterioration and  high frequency of surveillance required.  I certify that at the time  of admission, it is my clinical judgment that the patient will require inpatient hospital care extending more than 2 midnights.                            Dispo: The patient is from: Home              Anticipated d/c is to: Home              Patient currently is not medically stable to d/c.              Difficult to place patient: No  Hannah Beat M.D on 10/23/2023 at 9:02 AM  [facility]   From 7 PM-7 AM, contact night-coverage [email or web address]  CC: Primary care physician; Erasmo Downer, NP

## 2023-10-23 NOTE — Progress Notes (Signed)
[facility] Progress Note  Patient: Roberto Ingram    ZDG:[id number]  DOA: 10/23/2023     Date of Service: the patient was seen and examined on 10/23/2023  Chief Complaint  Patient presents with   Leg Swelling   Brief hospital course: Roberto Ingram is a 72 y.o. African-American male with PMH of CAD status post CABGx7 in 2007, recently diagnosed anaplastic large cell lymphoma, GIST tumor, CKD stage IV, HTN, gout, IDDM, COPD, BPH, chronic pain syndrome and narcotic dependence, as reviewed from EMR, presented to [facility] ED with c/o chest pain mild in intensity with no radiation as well as dyspnea, orthopnea and worsening lower extremity edema.  Denied any other complaints, no GI bleeding.   ED Course: When she comes to the ER when she came to the ER BP was 140/62 with otherwise normal vital signs.  Labs revealed blood glucose of 202 and a BUN of 27 and creatinine of 2.21.  BNP was 779.4 with a sensitive troponin I of 28 and later 37.  CBC showed leukocytosis 20.1 and anemia slightly worse than previous levels. EKG as reviewed by me : ..  EKG showed sinus rhythm with rate of 89 with LVH, lateral T wave inversion with ST segment depression. Imaging: Two-view chest x-ray showed cardiomegaly and vascular congestion with mild edema and suggested small pleural effusions.   The patient was given 40 mg of IV Lasix and 50 mg p.o. Lopressor.  He will be admitted to a progressive bed for further evaluation and management.   Assessment and Plan:  Acute on chronic diastolic CHF (congestive heart failure) (HCC) - This is based on a 2D echo with EF of 60 to 65% and grade 1 diastolic dysfunction in 2018. - continue diuresis with IV Lasix 80 BID - obtain a 2D echo for further assessment. 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 twice daily Patient will probably need anticoagulation lifelong due to history of GIST tumor, remains at high risk for recurrent DVT Held Eliquis for now due to low hemoglobin possible GI bleed Recommend to follow-up with hematology as an outpatient for duration of anticoagulation. Resume Eliquis when H&H stable  CAD s/p CABG 2007 Continue statin Held aspirin for now due to low hemoglobin Resume aspirin when H&H stable  Hypomagnesemia, mag repleted. Monitor electrolytes and replete as needed.  Dyslipidemia: continue statin therapy.   Hypothyroidism: continue Synthroid.   Type 2 diabetes mellitus without complication, with long-term current use of insulin (HCC) Resumed home dose basal 14 units nightly NovoLog sliding scale, monitor CBG, continue diabetic diet  Asthma, chronic - We will continue his albuterol and hold off long-acting beta agonist.   Essential hypertension - We will continue antihypertensive therapy.   Body mass index is 29.57 kg/m.  Interventions:  Diet: Heart healthy diet and diabetic diet.  Fluid restriction 1.5 L/day DVT Prophylaxis: SCD, pharmacological prophylaxis contraindicated due to low hemoglobin    Advance goals of care discussion: Full code  Family Communication: family was present at bedside, at the time of interview.  The pt provided permission to discuss medical plan with the family.  Opportunity was given to ask question and all questions were answered satisfactorily.   Disposition:  Pt is from home, admitted with CHF exacerbation, anasarca, anemia, still has edema on IV Lasix, which precludes a safe discharge. Discharge to home, when stable and cleared by cardiology..  Subjective: No significant  events overnight, patient has significant edema in the lower extremities, mild shortness of breath, no chest pain.  Physical Exam: General: NAD, lying comfortably Appear in no distress, affect appropriate Eyes: PERRLA ENT: Oral Mucosa Clear, moist  Neck: no JVD,  Cardiovascular: S1 and S2 Present, no Murmur,  Respiratory: Equal air entry bilaterally, no wheezing, mild bibasilar crackles Abdomen: Bowel Sound present, Soft and no tenderness,  Skin: no rashes Extremities: 4+ pedal edema, no calf tenderness Neurologic: without any new focal findings Gait not checked due to patient safety concerns  Vitals:   10/23/23 1118 10/23/23 1134 10/23/23 1357 10/23/23 1419  BP: 138/67 131/66 132/68 128/66  Pulse: 82 84 88 88  Resp: 20 20 (!) 28 20  Temp: 98.1 F (36.7 C) 98.2 F (36.8 C) 98 F (36.7 C) 98 F (36.7 C)  TempSrc: Oral Oral  Bladder  SpO2:  100% 98% 98%  Weight:      Height:        Intake/Output Summary (Last 24 hours) at 10/23/2023 1621 Last data filed at 10/23/2023 1118 Gross per 24 hour  Intake 430 ml  Output 620 ml  Net -190 ml   Filed Weights   10/22/23 1659  Weight: 98.9 kg    Data Reviewed: I have personally reviewed and interpreted daily labs, tele strips, imagings as discussed above. I reviewed all nursing notes, pharmacy notes, vitals, pertinent old records I have discussed plan of care as described above with RN and patient/family.  CBC: Recent Labs  Lab 10/22/23 1707 10/23/23 0500  WBC 20.1* 15.1*  HGB 7.3* 6.6*  HCT 23.0* 20.5*  MCV 98.7 98.1  PLT 264 266   Basic Metabolic Panel: Recent Labs  Lab 10/22/23 1707 10/23/23 0500  NA 138 139  K 3.9 3.5  CL 101 104  CO2 25 28  GLUCOSE 202* 119*  BUN 27* 25*  CREATININE 2.21* 2.00*  CALCIUM 8.2* 8.2*  MG  --  1.5*  PHOS  --  3.2    Studies: ECHOCARDIOGRAM COMPLETE Result Date: 10/23/2023    ECHOCARDIOGRAM REPORT   Patient Name:   Roberto Ingram Helseth Date of Exam: 10/23/2023 Medical Rec #:   [id number]      Height:       72.0 in Accession #:    [id number]     Weight:       218.0 lb Date of Birth:  [birth year]      BSA:          2.210 m Patient Age:    72 years       BP:           131/66 mmHg Patient Gender: M              HR:           84 bpm. Exam Location:  [facility] Procedure: 2D Echo, Color Doppler and Cardiac Doppler Indications:     CHF- acute diastolic I50.31  History:         Patient has prior history of Echocardiogram examinations, most                  recent 01/24/2017. CAD and Previous Myocardial Infarction,  Stroke; Risk Factors:Diabetes.  Sonographer:     Cristela Blue Referring Phys:  [id number] JAN A MANSY Diagnosing Phys: Alwyn Pea MD  Sonographer Comments: Technically challenging study due to limited acoustic windows, no parasternal window and no subcostal window. IMPRESSIONS  1. Left ventricular ejection fraction, by estimation, is 55 to 60%. The left ventricle has normal function. The left ventricle has no regional wall motion abnormalities. The left ventricular internal cavity size was moderately dilated. Left ventricular diastolic parameters were normal.  2. Right ventricular systolic function is normal. The right ventricular size is normal.  3. The mitral valve is normal in structure. Moderate to severe mitral valve regurgitation.  4. Tricuspid valve regurgitation is mild to moderate.  5. The aortic valve is normal in structure. Aortic valve regurgitation is mild. FINDINGS  Left Ventricle: Left ventricular ejection fraction, by estimation, is 55 to 60%. The left ventricle has normal function. The left ventricle has no regional wall motion abnormalities. The left ventricular internal cavity size was moderately dilated. There is borderline left ventricular hypertrophy. Left ventricular diastolic parameters were normal. Right Ventricle: The right ventricular size is normal. No increase in right ventricular wall thickness. Right ventricular systolic function is normal. Left  Atrium: Left atrial size was normal in size. Right Atrium: Right atrial size was normal in size. Pericardium: There is no evidence of pericardial effusion. Mitral Valve: The mitral valve is normal in structure. Moderate to severe mitral valve regurgitation. MV peak gradient, 6.2 mmHg. The mean mitral valve gradient is 3.0 mmHg. Tricuspid Valve: The tricuspid valve is grossly normal. Tricuspid valve regurgitation is mild to moderate. Aortic Valve: The aortic valve is normal in structure. Aortic valve regurgitation is mild. Aortic valve mean gradient measures 3.0 mmHg. Aortic valve peak gradient measures 4.0 mmHg. Aortic valve area, by VTI measures 3.32 cm. Pulmonic Valve: The pulmonic valve was normal in structure. Pulmonic valve regurgitation is trivial. Aorta: The ascending aorta was not well visualized. IAS/Shunts: No atrial level shunt detected by color flow Doppler.  LEFT VENTRICLE PLAX 2D LVIDd:         5.60 cm   Diastology LVIDs:         3.90 cm   LV e' medial:    6.31 cm/s LV PW:         1.00 cm   LV E/e' medial:  17.6 LV IVS:        1.20 cm   LV e' lateral:   9.57 cm/s LVOT diam:     2.00 cm   LV E/e' lateral: 11.6 LV SV:         65 LV SV Index:   29 LVOT Area:     3.14 cm  RIGHT VENTRICLE RV Basal diam:  3.50 cm RV Mid diam:    3.10 cm LEFT ATRIUM             Index        RIGHT ATRIUM           Index LA diam:        3.90 cm 1.76 cm/m   RA Area:     19.60 cm LA Vol (A2C):   65.4 ml 29.59 ml/m  RA Volume:   59.30 ml  26.83 ml/m LA Vol (A4C):   65.3 ml 29.55 ml/m LA Biplane Vol: 69.2 ml 31.31 ml/m  AORTIC VALVE AV Area (Vmax):    3.55 cm AV Area (Vmean):   2.64 cm AV Area (VTI):     3.32  cm AV Vmax:           100.00 cm/s AV Vmean:          75.100 cm/s AV VTI:            0.195 m AV Peak Grad:      4.0 mmHg AV Mean Grad:      3.0 mmHg LVOT Vmax:         113.00 cm/s LVOT Vmean:        63.100 cm/s LVOT VTI:          0.206 m LVOT/AV VTI ratio: 1.06  AORTA Ao Root diam: 3.80 cm MITRAL VALVE                 TRICUSPID VALVE MV Area (PHT): 4.41 cm     TR Peak grad:   6.8 mmHg MV Area VTI:   2.78 cm     TR Vmax:        130.00 cm/s MV Peak grad:  6.2 mmHg MV Mean grad:  3.0 mmHg     SHUNTS MV Vmax:       1.24 m/s     Systemic VTI:  0.21 m MV Vmean:      82.0 cm/s    Systemic Diam: 2.00 cm MV Decel Time: 172 msec MV E velocity: 111.00 cm/s MV A velocity: 79.10 cm/s MV E/A ratio:  1.40 Alwyn Pea MD Electronically signed by Alwyn Pea MD Signature Date/Time: 10/23/2023/1:43:44 PM    Final    DG Chest 2 View Result Date: 10/22/2023 CLINICAL DATA:  Chest pain EXAM: CHEST - 2 VIEW COMPARISON:  X-ray 07/31/2023. FINDINGS: Enlarged cardiopericardial silhouette with vascular congestion. Mild edema. Small bilateral pleural effusions. No pneumothorax. No consolidation. Degenerative changes seen along the spine. IMPRESSION: Postop chest with enlarged heart and vascular congestion. Mild edema suggested small effusions. Electronically Signed   By: Karen Kays M.D.   On: 10/22/2023 18:45    Scheduled Meds:  allopurinol  100 mg Oral Daily   ascorbic acid  500 mg Oral Daily   aspirin  81 mg Oral Daily   furosemide  80 mg Intravenous BID   insulin aspart  0-5 Units Subcutaneous QHS   insulin aspart  0-9 Units Subcutaneous TID WC   insulin glargine-yfgn  14 Units Subcutaneous QHS   levothyroxine  50 mcg Oral Q0600   metoprolol succinate  25 mg Oral BID   multivitamin with minerals  1 tablet Oral Daily   omega-3 acid ethyl esters  1 g Oral Daily   pantoprazole (PROTONIX) IV  40 mg Intravenous Q12H   rosuvastatin  40 mg Oral Daily   senna-docusate  1 tablet Oral BID   Continuous Infusions:  magnesium sulfate bolus IVPB     PRN Meds: acetaminophen **OR** acetaminophen, albuterol, ALPRAZolam, colchicine, fluticasone, magnesium hydroxide, nitroGLYCERIN, ondansetron **OR** ondansetron (ZOFRAN) IV, oxyCODONE, traZODone  Time spent: 55 minutes  Author: Gillis Santa. MD [facility] 10/23/2023  4:21 PM  To reach On-call, see care teams to locate the attending and reach out to them via [email or web address]. If 7PM-7AM, please contact night-coverage If you still have difficulty reaching the attending provider, please page the [facility] (Director on Call) for [facility] on amion for assistance.

## 2023-10-23 NOTE — ED Notes (Signed)
 Pt admitted to [facility]

## 2023-10-23 NOTE — Progress Notes (Signed)
Heart Failure Stewardship Pharmacy Note  PCP: Erasmo Downer, NP PCP-Cardiologist: None  HPI: Roberto Ingram is a 72 y.o. male with CAD s/p PTCA in 1994 and CABG in 2007, arthritis, BPH, anaplastic large cell lymphoma, gastric wedge resection of stromal tumor, CVA, T2DM, glaucoma, gout, HLD, OSA on CPAP, seizures, hypothyroidism, CKD, and diastolic heart failure who presented with midsternal chest pain, dyspnea, orthopnea, and LEE.   Pertinent cardiac history: AMI with balloon angioplasty D1 in 1994. CABG x 4 in 2007. Echo in 2007 with normal EF and G1DD. CVA in 2019 and 2021. Echo in 01/2017 with LVEF 60-65%, mild LVH, grade I diastolic dysfunction. Echo this admission pending.  Pertinent Lab Values: Creatinine, Ser  Date Value Ref Range Status  10/23/2023 2.00 (H) 0.61 - 1.24 mg/dL Final   BUN  Date Value Ref Range Status  10/23/2023 25 (H) 8 - 23 mg/dL Final   Potassium  Date Value Ref Range Status  10/23/2023 3.5 3.5 - 5.1 mmol/L Final   Sodium  Date Value Ref Range Status  10/23/2023 139 135 - 145 mmol/L Final   B Natriuretic Peptide  Date Value Ref Range Status  10/22/2023 779.4 (H) 0.0 - 100.0 pg/mL Final    Comment:    Performed at [facility], [address] [place], [place] [address]   Magnesium  Date Value Ref Range Status  10/23/2023 1.5 (L) 1.7 - 2.4 mg/dL Final    Comment:    Performed at [facility], [address] [place], [place] [address]   Hgb A1c MFr Bld  Date Value Ref Range Status  07/19/2023 7.8 (H) 4.8 - 5.6 % Final    Comment:    (NOTE) Pre diabetes:          5.7%-6.4%  Diabetes:              >6.4%  Glycemic control for   <7.0% adults with diabetes    TSH  Date Value Ref Range Status  07/31/2023 4.276 0.350 - 4.500 uIU/mL Final    Comment:    Performed by a 3rd Generation assay with a functional sensitivity of <=0.01 uIU/mL. Performed at [facility], [address] [place], [place] [address]     LDH  Date Value Ref Range Status  07/21/2023 179 98 - 192 U/L Final    Comment:    Performed at [facility], [address] [place], [place] [address]    Vital Signs:  Temp:  [97.9 F (36.6 C)-98.4 F (36.9 C)] 98 F (36.7 C) (12/12 0901) Pulse Rate:  [71-92] 82 (12/12 0901) Cardiac Rhythm: Normal sinus rhythm (12/12 0417) Resp:  [16-23] 22 (12/12 0901) BP: (113-140)/(62-72) 123/64 (12/12 0901) SpO2:  [93 %-100 %] 99 % (12/12 0901) Weight:  [98.9 kg (218 lb)] 98.9 kg (218 lb) (12/11 1659)  Intake/Output Summary (Last 24 hours) at 10/23/2023 1115 Last data filed at 10/23/2023 2956 Gross per 24 hour  Intake 150 ml  Output 620 ml  Net -470 ml    Current Heart Failure Medications:  Loop diuretic: furosemide 4mg /hr infusion Beta-Blocker: metoprolol tartrate 50 mg bid ACEI/ARB/ARNI: none MRA: none SGLT2i: none Other: isosorbide 30 mg daily  Prior to admission Heart Failure Medications:  Loop diuretic: furosemide 80 mg daily Beta-Blocker: metoprolol tartrate 50 mg bid ACEI/ARB/ARNI: none MRA: none SGLT2i: none Other: isosorbide 30 mg daily, Ozempic 2 mg  Assessment: 1. Acute on chronic diastolic heart failure (LVEF most recently 60-65%) with grade I diastolic dysfunction,  due to unknown etiology. NYHA class III symptoms.  -Symptoms: Reports shortness of breath on exertion and LEE. -Volume: Hypervolemic on exam. -Hemodynamics: BP elevated today. Taking amlodipine 10 mg daily at home -SGLT2i: Consider adding Farxiga 10 mg daily for HFpEF and CKD.  -MRA: Not a good candidate for spironolactone at this time given creatinine. -ARNI: Not an excellent candidate for ARNI at this time.  Plan: 1) Medication changes recommended at this time: -Consider changing furosemide to 80 mg IV BID -Consider starting Farxiga 10 mg daily  2) Patient assistance: -Jardiance copay is $143.45 and Marcelline Deist is $15.00   3) Education: - Patient has been educated on current HF  medications and potential additions to HF medication regimen - Patient verbalizes understanding that over the next few months, these medication doses may change and more medications may be added to optimize HF regimen - Patient has been educated on basic disease state pathophysiology and goals of therapy  Medication Assistance / Insurance Benefits Check: Does the patient have prescription insurance?    Type of insurance plan:  Does the patient qualify for medication assistance through manufacturers or grants? Pending  Eligible grants and/or patient assistance programs: pending  Medication assistance applications in progress: pending  Medication assistance applications approved: pending Approved medication assistance renewals will be completed by: pending  Outpatient Pharmacy: Prior to admission outpatient pharmacy: [facility]     Please do not hesitate to reach out with questions or concerns,  Enos Fling, PharmD, CPP, BCPS Heart Failure Pharmacist  Phone - [phone] 10/23/2023 2:37 PM

## 2023-10-23 NOTE — Inpatient Diabetes Management (Signed)
Inpatient Diabetes Program Recommendations  AACE/ADA: New Consensus Statement on Inpatient Glycemic Control (2015)  Target Ranges:  Prepandial:   less than 140 mg/dL      Peak postprandial:   less than 180 mg/dL (1-2 hours)      Critically ill patients:  140 - 180 mg/dL   Lab Results  Component Value Date   GLUCAP 183 (H) 08/04/2023   HGBA1C 7.8 (H) 07/19/2023    Review of Glycemic Control  Latest Reference Range & Units 10/22/23 17:07 10/23/23 05:00  Glucose 70 - 99 mg/dL 409 (H) 811 (H)  (H): Data is abnormally high  Diabetes history: DM2 Outpatient Diabetes medications: Semglee 68 units at bedtime, Novolog 16 units with BF, 16 units with lunch and 18 units with dinner, Ozempic 2 mg weekly Current orders for Inpatient glycemic control: Semglee 68 units at bedtime, Novolog 0-9 units TID and 0-5 units QHS  Inpatient Diabetes Program Recommendations:    Please consider reducing basal while in the hospital to 50% home dose; titrate as needed.    Semglee 34 units at bedtime  Will continue to follow while inpatient.  Thank you, Dulce Sellar, MSN, CDCES Diabetes Coordinator Inpatient Diabetes Program [phone] (team pager from 8a-5p)

## 2023-10-23 NOTE — Progress Notes (Signed)
*  PRELIMINARY RESULTS* Echocardiogram 2D Echocardiogram has been performed.  Roberto Ingram 10/23/2023, 1:21 PM

## 2023-10-23 NOTE — Assessment & Plan Note (Signed)
-   We will continue his albuterol and hold off long-acting beta agonist.

## 2023-10-23 NOTE — Consult Note (Signed)
[facility] CLINIC CARDIOLOGY CONSULT NOTE       Patient ID: Roberto Ingram MRN: [id number] DOB/AGE: 73-15-1952 73 y.o.  Admit date: 10/23/2023 Referring Physician Dr. Gillis Santa Primary Physician [place] Ouch, Alleen Borne, NP  Primary Cardiologist Dr. Marcina Millard Reason for Consultation AoCHF  HPI: Roberto Ingram is a 72 y.o. male  with a past medical history of coronary artery disease s/p CABGx4 in 2007, hypertension, hyperlipidemia, type II diabetes, hx CVA, hypothyroidism, asthma, chronic kidney disease stage IIIa, anaplastic large cell lymphoma, malignant gastrointestinal stromal tumor who presented to the ED on 10/23/2023 for leg swelling, shortness of breath. Cardiology was consulted for further evaluation.   Patient reports that for the last 1 week he has noticed worsening shortness of breath with exertion.  Also reports that he has had worsening lower extremity edema.  His wife reports that he has been feeling more fatigued recently.  Was in the hospital at [facility] in October and required blood transfusion due to anemia.  Given his worsening shortness of breath and lower extremity edema symptoms he was told to come to the ED for further evaluation.  Workup in the ED notable for creatinine 2.21, potassium 3.9, hemoglobin 7.3, WBC 20.1.  Hemoglobin 7.3, WBC 20.1.  BNP elevated at 779.  Troponins mildly elevated and flat trending at 28 > 37.  EKG demonstrated normal sinus rhythm with nonspecific ST-T changes.  Chest x-ray notable for pulmonary vascular congestion.  He was given a dose of IV Lasix in the ED.  At the time of my evaluation this afternoon patient is resting comfortably in ED stretcher with wife present at bedside.  We discussed his recent symptoms in further detail.  He also endorses a burning sensation in his chest/twinges which have been an ongoing issue for many years.  He reports they may have become more frequent recently.  He denies any exertional chest pain or other anginal  symptoms.  States that he has had some dizziness associated with fatigue recently.  Denies any episodes of palpitations or syncope.  Review of systems complete and found to be negative unless listed above    Past Medical History:  Diagnosis Date   Anemia    Anxiety    Generalized anxiety disorder   Asthmatic bronchitis    Atopic dermatitis    BPH (benign prostatic hyperplasia)    Cardiovascular disease    Carotid disease, bilateral (HCC)    Colon polyp    Constipation    Coronary artery disease    Diabetes mellitus without complication (HCC)    Diabetic retinopathy (HCC)    GERD (gastroesophageal reflux disease)    Glaucoma of left eye    Gout    History of GI bleed    Hypercholesterolemia    Hypertension    Hypomagnesemia    Hypothyroidism    Insomnia    Mixed hyperlipidemia    Myocardial infarction (HCC)    Pneumonia    Renal disorder    Renal insufficiency    Sleep apnea    wears oxygen at night (unable to tolerate CPAP)   Stroke (HCC)    Stroke [facility])    Thyroid disease    Vitamin D deficiency     Past Surgical History:  Procedure Laterality Date   COLONOSCOPY     COLONOSCOPY WITH PROPOFOL N/A 02/24/2018   Procedure: COLONOSCOPY WITH PROPOFOL;  Surgeon: Toledo, Boykin Nearing, MD;  Location: [facility] ENDOSCOPY;  Service: Gastroenterology;  Laterality: N/A;   COLONOSCOPY WITH PROPOFOL N/A 08/06/2022  Procedure: COLONOSCOPY WITH PROPOFOL;  Surgeon: Regis Bill, MD;  Location: [facility] ENDOSCOPY;  Service: Gastroenterology;  Laterality: N/A;   CORONARY ANGIOPLASTY     CORONARY ARTERY BYPASS GRAFT     ESOPHAGOGASTRODUODENOSCOPY     EXPLORATION POST OPERATIVE OPEN HEART     EYE SURGERY     Lens eye surgery   INGUINAL LYMPH NODE BIOPSY Left 07/22/2023   Procedure: LEFT AXILLARY LYMPH NODE BIOPSY;  Surgeon: Carolan Shiver, MD;  Location: [facility] ORS;  Service: General;  Laterality: Left;   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)    07/2023   NASAL POLYP EXCISION  1994   POLYPECTOMY     Quadruple bypass      (Not in a hospital admission)  Social History   Socioeconomic History   Marital status: Married    Spouse name: Sheralyn Boatman   Number of children: 3   Years of education: 12   Highest education level: High school graduate  Occupational History   Occupation: retired  Tobacco Use   Smoking status: Former    Current packs/day: 0.00    Types: Cigarettes    Quit date: 06/11/1993    Years since quitting: 30.3   Smokeless tobacco: Never  Vaping Use   Vaping status: Never Used  Substance and Sexual Activity   Alcohol use: No   Drug use: No   Sexual activity: Not Currently    Comment: Married   Other Topics Concern   Not on file  Social History Narrative   Not on file   Social Drivers of Health   Financial Resource Strain: Low Risk  (09/16/2023)   Received from [facility]   Overall Financial Resource Strain (CARDIA)    Difficulty of Paying Living Expenses: Not very hard  Food Insecurity: Food Insecurity Present (09/16/2023)   Received from [facility]   Hunger Vital Sign    Worried About Running Out of Food in the Last Year: Never true    Ran Out of Food in the Last Year: Sometimes true  Transportation Needs: No Transportation Needs (09/17/2023)   Received from [facility] - Transportation    In the past 12 months, has lack of transportation kept you from medical appointments or from getting medications?: No    Lack of Transportation (Non-Medical): No  Recent Concern: Transportation Needs - Unmet Transportation Needs (08/31/2023)   Received from [facility] - Transportation    In the past 12 months, has lack of transportation kept you from medical appointments or from getting medications?: Yes    Lack of Transportation (Non-Medical): No  Physical Activity: Insufficiently Active (03/04/2018)   Exercise Vital Sign     Days of Exercise per Week: 2 days    Minutes of Exercise per Session: 10 min  Stress: No Stress Concern Present (03/04/2018)   [employer] of Occupational Health - Occupational Stress Questionnaire    Feeling of Stress : Only a little  Social Connections: Socially Integrated (03/04/2018)   Social Connection and Isolation Panel [NHANES]    Frequency of Communication with Friends and Family: More than three times a week    Frequency of Social Gatherings with Friends and Family: Once a week    Attends Religious Services: More than 4 times per year    Active Member of [employer] or Organizations: Yes    Attends Banker Meetings: More than 4 times per year  Marital Status: Married  Catering manager Violence: Not At Risk (03/04/2018)   Humiliation, Afraid, Rape, and Kick questionnaire    Fear of Current or Ex-Partner: No    Emotionally Abused: No    Physically Abused: No    Sexually Abused: No    Family History  Problem Relation Age of Onset   Atrial fibrillation Mother    Heart disease Mother    Stroke Mother    Bell's palsy Mother    Kidney disease Mother    Cancer Mother    Diabetes Father    Arthritis Father    Stroke Brother    Diabetes Brother    Lung cancer Brother    Heart attack Brother    Cancer Brother    Heart disease Sister    COPD Sister    Throat cancer Sister      Vitals:   10/23/23 0901 10/23/23 1115 10/23/23 1118 10/23/23 1134  BP: 123/64  138/67 131/66  Pulse: 82 92 82 84  Resp: (!) 22 20 20 20   Temp: 98 F (36.7 C) 98.1 F (36.7 C) 98.1 F (36.7 C) 98.2 F (36.8 C)  TempSrc: Oral Oral Oral Oral  SpO2: 99% 98%  100%  Weight:      Height:        PHYSICAL EXAM General: Well-appearing elderly male, well nourished, in no acute distress sitting upright in ED stretcher with wife present at bedside. HEENT: Normocephalic and atraumatic. Neck: No JVD.  Lungs: Normal respiratory effort on room air. Clear bilaterally to auscultation.   Bilateral crackles. Heart: HRRR. Normal S1 and S2, + systolic murmur. Abdomen: Non-distended appearing.  Msk: Normal strength and tone for age. Extremities: Warm and well perfused. No clubbing, cyanosis.  2+ pitting edema.  Neuro: Alert and oriented X 3. Psych: Answers questions appropriately.   Labs: Basic Metabolic Panel: Recent Labs    10/22/23 1707 10/23/23 0500  NA 138 139  K 3.9 3.5  CL 101 104  CO2 25 28  GLUCOSE 202* 119*  BUN 27* 25*  CREATININE 2.21* 2.00*  CALCIUM 8.2* 8.2*  MG  --  1.5*  PHOS  --  3.2   Liver Function Tests: No results for input(s): "AST", "ALT", "ALKPHOS", "BILITOT", "PROT", "ALBUMIN" in the last 72 hours. No results for input(s): "LIPASE", "AMYLASE" in the last 72 hours. CBC: Recent Labs    10/22/23 1707 10/23/23 0500  WBC 20.1* 15.1*  HGB 7.3* 6.6*  HCT 23.0* 20.5*  MCV 98.7 98.1  PLT 264 266   Cardiac Enzymes: Recent Labs    10/22/23 1707 10/23/23 0029 10/23/23 0500  CKTOTAL  --   --  40*  TROPONINIHS 28* 37*  --    BNP: Recent Labs    10/22/23 1707  BNP 779.4*   D-Dimer: No results for input(s): "DDIMER" in the last 72 hours. Hemoglobin A1C: No results for input(s): "HGBA1C" in the last 72 hours. Fasting Lipid Panel: No results for input(s): "CHOL", "HDL", "LDLCALC", "TRIG", "CHOLHDL", "LDLDIRECT" in the last 72 hours. Thyroid Function Tests: No results for input(s): "TSH", "T4TOTAL", "T3FREE", "THYROIDAB" in the last 72 hours.  Invalid input(s): "FREET3" Anemia Panel: Recent Labs    10/23/23 0500  FOLATE 14.1  TIBC 230*  IRON 32*     Radiology: DG Chest 2 View Result Date: 10/22/2023 CLINICAL DATA:  Chest pain EXAM: CHEST - 2 VIEW COMPARISON:  X-ray 07/31/2023. FINDINGS: Enlarged cardiopericardial silhouette with vascular congestion. Mild edema. Small bilateral pleural effusions. No pneumothorax. No consolidation.  Degenerative changes seen along the spine. IMPRESSION: Postop chest with enlarged heart and  vascular congestion. Mild edema suggested small effusions. Electronically Signed   By: Karen Kays M.D.   On: 10/22/2023 18:45    ECHO pending  TELEMETRY reviewed by me 10/23/2023: Sinus rhythm rate 90s  EKG reviewed by me: NSR rate 89 bpm, nonspecific ST-T changes  Data reviewed by me 10/23/2023: last 24h vitals tele labs imaging I/O ED provider note, admission H&P  Principal Problem:   Acute on chronic diastolic CHF (congestive heart failure) (HCC) Active Problems:   Essential hypertension   Asthma, chronic   Type 2 diabetes mellitus without complication, with long-term current use of insulin (HCC)   Hypothyroidism   Dyslipidemia    ASSESSMENT AND PLAN:  Roberto Ingram is a 72 y.o. male  with a past medical history of coronary artery disease s/p CABGx4 in 2007, hypertension, hyperlipidemia, type II diabetes, hx CVA, hypothyroidism, asthma, chronic kidney disease stage IIIa, malignant gastrointestinal stromal tumor who presented to the ED on 10/23/2023 for leg swelling, shortness of breath. Cardiology was consulted for further evaluation.   # Acute on chronic HFpEF # Hypertension Patient with worsening LE edema and SOB x 1 week.  BNP elevated at 779.  Chest x-ray with pulmonary vascular congestion.  Last echo 08/2023 with EF 50%. -Echo pending -Will plan for diuresis with IV Lasix 80 mg twice daily.  Patient takes p.o. Lasix 80 mg once daily at home. -Plan to start SGLT2 inhibitor prior to discharge, patient previously prescribed Farxiga but reported that this was expensive.  Will work with pharmacy on cost evaluation and patient assistance if available. -Will switch metoprolol to succinate 25 mg twice daily.  Consider further additions to GDMT pending BP and renal function.  # Coronary artery disease s/p CABG x4 2007 # Hyperlipidemia Endorses twinges in chest which he has had for many years, denies any exertional chest pain.  Opponens mildly elevated and flat at 28 >  37. -Continue home rosuvastatin 40 mg daily and aspirin 81 mg daily. -Mildly elevated troponin most consistent with demand/supply mismatch and not ACS in the setting of acute heart failure. -No plan for further cardiac diagnostics at this time.   This patient's plan of care was discussed and created with Dr. Juliann Pares and he is in agreement.  Signed: Gale Journey, PA-C  10/23/2023, 1:26 PM [facility]

## 2023-10-23 NOTE — Consult Note (Signed)
Consultation  Referring Provider:  Hospitalist  Admit date: 10/23/2023 Consult date: 10/23/2023         Reason for Consultation: Anemia              HPI:   Roberto Ingram is a 72 y.o. 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 He was recently diagnosed with anaplastic large cell lymphoma and has received three prior treatments that have had to be dose reduced due to side effects. He had an unremarkable colonoscopy last year and had an EGD this year with GIST tumor found that required a wedge resection. At a recent hospitalization due to side effects from his chemotherapy he was started on a DOAC for upper extremity blood clot. He has no overt GI bleeding, No NSAID use. His hemoglobin at baseline varies recently due to chemotherapy and has required multiple pRBC's due to low hemoglobin from chemotherapy. He has no melena or hematochezia.  Past Medical History:  Diagnosis Date   Anemia    Anxiety    Generalized anxiety disorder   Asthmatic bronchitis    Atopic dermatitis    BPH (benign prostatic hyperplasia)    Cardiovascular disease    Carotid disease, bilateral (HCC)    Colon polyp    Constipation    Coronary artery disease    Diabetes mellitus without complication (HCC)    Diabetic retinopathy (HCC)    GERD (gastroesophageal reflux disease)    Glaucoma of left eye    Gout    History of GI bleed    Hypercholesterolemia    Hypertension    Hypomagnesemia    Hypothyroidism    Insomnia    Mixed hyperlipidemia    Myocardial infarction (HCC)    Pneumonia    Renal disorder    Renal insufficiency    Sleep apnea    wears oxygen at night (unable to tolerate CPAP)   Stroke (HCC)    Stroke [facility])    Thyroid disease    Vitamin D deficiency     Past Surgical History:  Procedure  Laterality Date   COLONOSCOPY     COLONOSCOPY WITH PROPOFOL N/A 02/24/2018   Procedure: COLONOSCOPY WITH PROPOFOL;  Surgeon: Toledo, Boykin Nearing, MD;  Location: [facility] ENDOSCOPY;  Service: Gastroenterology;  Laterality: N/A;   COLONOSCOPY WITH PROPOFOL N/A 08/06/2022   Procedure: COLONOSCOPY WITH PROPOFOL;  Surgeon: Regis Bill, MD;  Location: [facility] ENDOSCOPY;  Service: Gastroenterology;  Laterality: N/A;   CORONARY ANGIOPLASTY     CORONARY ARTERY BYPASS GRAFT     ESOPHAGOGASTRODUODENOSCOPY     EXPLORATION POST OPERATIVE OPEN HEART     EYE SURGERY     Lens eye surgery   INGUINAL LYMPH NODE BIOPSY Left 07/22/2023   Procedure: LEFT AXILLARY LYMPH NODE BIOPSY;  Surgeon: Carolan Shiver, MD;  Location: [facility] ORS;  Service: General;  Laterality: Left;   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)   07/2023   NASAL POLYP EXCISION  1994   POLYPECTOMY     Quadruple bypass      Family History  Problem Relation Age of Onset   Atrial fibrillation Mother    Heart disease Mother    Stroke Mother    Bell's palsy Mother    Kidney disease Mother    Cancer Mother    Diabetes Father  Arthritis Father    Stroke Brother    Diabetes Brother    Lung cancer Brother    Heart attack Brother    Cancer Brother    Heart disease Sister    COPD Sister    Throat cancer Sister      Social History   Tobacco Use   Smoking status: Former    Current packs/day: 0.00    Types: Cigarettes    Quit date: 06/11/1993    Years since quitting: 30.3   Smokeless tobacco: Never  Vaping Use   Vaping status: Never Used  Substance Use Topics   Alcohol use: No   Drug use: No    Prior to Admission medications   Medication Sig Start Date End Date Taking? Authorizing Provider  acetaminophen (TYLENOL) 325 MG tablet Take 650 mg by mouth every 6 (six) hours as needed. 06/09/23  Yes [provider]  acyclovir (ZOVIRAX) 400 MG tablet Take 400 mg by mouth 2 (two) times  daily. Given at 9 am and 9 pm 09/29/23  Yes [provider]  albuterol (VENTOLIN HFA) 108 (90 Base) MCG/ACT inhaler Inhale 2 puffs into the lungs every 6 (six) hours as needed for wheezing or shortness of breath. Patient taking differently: Inhale 2 puffs into the lungs every 4 (four) hours as needed for wheezing or shortness of breath. 07/24/23  Yes Jonah Blue, MD  allopurinol (ZYLOPRIM) 100 MG tablet Take 100 mg by mouth daily. Taken in the evening   Yes [provider]  ALPRAZolam Prudy Feeler) 0.5 MG tablet Take 0.5 mg by mouth at bedtime as needed for sleep.    Yes [provider]  amLODipine (NORVASC) 10 MG tablet Take 1 tablet by mouth daily. 09/29/23 09/28/24 Yes [provider]  ammonium lactate (AMLACTIN) 12 % cream Apply 1 Application topically 2 (two) times daily. Apply to arms, legs, and body one to two times daily 06/12/23  Yes [provider]  apixaban (ELIQUIS) 5 MG TABS tablet Take 2 tablets (10 mg total) by mouth 2 (two) times daily for 7 days, THEN 1 tablet (5 mg total) 2 (two) times daily. Patient taking differently: Taken 2x daily, taken at 9 am and 9 pm  08/04/23 11/09/23 Yes Charise Killian, MD  aspirin 81 MG chewable tablet Chew 81 mg by mouth daily.    Yes [provider]  benzonatate (TESSALON) 100 MG capsule Take 100 mg by mouth 2 (two) times daily as needed for cough. 09/29/23  Yes [provider]  budesonide-formoterol (SYMBICORT) 160-4.5 MCG/ACT inhaler Inhale 2 puffs into the lungs every 12 (twelve) hours. 09/29/23 09/28/24 Yes [provider]  bumetanide (BUMEX) 0.5 MG tablet Take 0.5 mg by mouth daily as needed. 08/13/23  Yes [provider]  Carboxymethylcellulose Sod PF 0.5 % SOLN Apply 1 drop to eye 3 (three) times daily as needed. 09/29/23  Yes [provider]  diphenhydrAMINE (BENADRYL ALLERGY) 25 mg capsule Take 1 capsule (25 mg total) by mouth every 6 (six) hours as  needed. Patient taking differently: Take 25 mg by mouth every 6 (six) hours as needed for itching, allergies or sleep. Not used often b/c it causes his prostate to swell per spouse 05/23/23  Yes Dionne Bucy, MD  docusate sodium (COLACE) 100 MG capsule Take 100 mg by mouth daily as needed for mild constipation or moderate constipation.   Yes [provider]  fluconazole (DIFLUCAN) 100 MG tablet Take 100 mg by mouth daily. 10/17/23 10/24/23 Yes [provider]  fluticasone (FLONASE) 50 MCG/ACT nasal spray Place 2 sprays into both nostrils daily as needed for allergies or rhinitis.   Yes [provider]  fluticasone-salmeterol (ADVAIR) 250-50 MCG/ACT AEPB Inhale 1 puff into the lungs 2 (two) times daily as needed.   Yes [provider]  furosemide (LASIX) 80 MG tablet Take 80 mg by mouth daily. 09/29/23 09/28/24 Yes [provider]  insulin aspart (NOVOLOG) 100 UNIT/ML injection Inject 5 Units into the skin 3 (three) times daily before meals.   Yes [provider]  insulin glargine-yfgn (SEMGLEE) 100 UNIT/ML injection Inject 0.15 mLs (15 Units total) into the skin daily. Patient taking differently: Inject 14 Units into the skin at bedtime. 07/25/23  Yes Jonah Blue, MD  levothyroxine (SYNTHROID) 50 MCG tablet Take 50 mcg by mouth daily before breakfast.  01/12/14  Yes [provider]  LORazepam (ATIVAN) 1 MG tablet Take 1 mg by mouth every 8 (eight) hours as needed. 10/03/23  Yes [provider]  metoprolol tartrate (LOPRESSOR) 25 MG tablet Take 25 mg by mouth QID. Taken at 6 am, 12 noon, 6 pm and 12 midnight 09/29/23 09/28/24 Yes [provider]  mirtazapine (REMERON) 7.5 MG tablet Take 7.5 mg by mouth at bedtime. 09/29/23 09/28/24 Yes [provider]  Multiple Vitamin (MULTIVITAMIN WITH MINERALS) TABS tablet Take 1 tablet by mouth daily. 07/25/23  Yes Jonah Blue, MD  nitroGLYCERIN (NITROSTAT) 0.4 MG SL  tablet Place 0.4 mg under the tongue every 5 (five) minutes as needed for chest pain.    Yes [provider]  nystatin (MYCOSTATIN) 100000 UNIT/ML suspension Take 5 mLs by mouth 4 (four) times daily as needed. 10/13/23  Yes [provider]  oxyCODONE (OXY IR/ROXICODONE) 5 MG immediate release tablet Take 1 tablet (5 mg total) by mouth every 4 (four) hours as needed for breakthrough pain. 07/24/23  Yes Jonah Blue, MD  pantoprazole (PROTONIX) 40 MG tablet Take 40 mg by mouth daily. 09/29/23  Yes [provider]  potassium chloride SA (KLOR-CON M) 20 MEQ tablet Take 20 mEq by mouth daily. 09/30/23 09/29/24 Yes [provider]  predniSONE (DELTASONE) 50 MG tablet Take 50 mg by mouth as directed. Following chemo per spouse 10/03/23  Yes [provider]  rosuvastatin (CRESTOR) 40 MG tablet Take 40 mg by mouth at bedtime. 03/29/14  Yes [provider]  tamsulosin (FLOMAX) 0.4 MG CAPS capsule Take 0.4 mg by mouth daily. 09/29/23  Yes [provider]  Vitamin D, Ergocalciferol, (DRISDOL) 50000 units CAPS capsule Take 50,000 Units by mouth every 30 (thirty) days. Takes on 1st of each month   Yes [provider]  VITAMIN D-1000 MAX ST 25 MCG (1000 UT) tablet Take 1,000 Units by mouth daily. 09/29/23  Yes [provider]  colchicine 0.6 MG tablet Take 0.6 mg by mouth as needed (gout flares). Patient not taking: Reported on 10/23/2023 05/13/23   [provider]  isosorbide mononitrate (IMDUR) 30 MG 24 hr tablet Take 1 tablet (30 mg total) by mouth daily. Patient not taking: Reported on 10/23/2023 08/05/23 09/04/23  Charise Killian, MD  Omega-3 Fatty Acids (FISH OIL) 1000 MG CAPS Take 1,000 mg by mouth daily.  Patient not taking: Reported on 10/23/2023    [provider]  Semaglutide, 2 MG/DOSE, (OZEMPIC, 2 MG/DOSE,) 8 MG/3ML SOPN Inject 2 mg into the skin every Thursday. Patient not taking: Reported on  10/23/2023    [provider]  traZODone (DESYREL) 50 MG tablet  Take 1 tablet by mouth at bedtime. Patient not taking: Reported on 10/23/2023 09/29/23 09/28/24  [provider]  mometasone (NASONEX) 50 MCG/ACT nasal spray Place into the nose.  09/11/16  [provider]    Current Facility-Administered Medications  Medication Dose Route Frequency Provider Last Rate Last Admin   acetaminophen (TYLENOL) tablet 650 mg  650 mg Oral Q6H PRN Mansy, Jan A, MD       Or   acetaminophen (TYLENOL) suppository 650 mg  650 mg Rectal Q6H PRN Mansy, Jan A, MD       albuterol (PROVENTIL) (2.5 MG/3ML) 0.083% nebulizer solution 2.5 mg  2.5 mg Nebulization Q6H PRN Otelia Sergeant, RPH       allopurinol (ZYLOPRIM) tablet 100 mg  100 mg Oral Daily Mansy, Jan A, MD       ALPRAZolam Prudy Feeler) tablet 0.5 mg  0.5 mg Oral QHS PRN Mansy, Jan A, MD       ascorbic acid (VITAMIN C) tablet 500 mg  500 mg Oral Daily Mansy, Jan A, MD   500 mg at 10/23/23 8295   aspirin chewable tablet 81 mg  81 mg Oral Daily Mansy, Jan A, MD       colchicine tablet 0.6 mg  0.6 mg Oral PRN Mansy, Jan A, MD       fluticasone (FLONASE) 50 MCG/ACT nasal spray 2 spray  2 spray Each Nare Daily PRN Mansy, Jan A, MD       furosemide (LASIX) injection 80 mg  80 mg Intravenous BID Hudson, Caralyn, PA-C       insulin aspart (novoLOG) injection 0-5 Units  0-5 Units Subcutaneous QHS Mansy, Jan A, MD       insulin aspart (novoLOG) injection 0-9 Units  0-9 Units Subcutaneous TID WC Mansy, Jan A, MD       insulin glargine-yfgn [facility]) injection 14 Units  14 Units Subcutaneous QHS Lowella Bandy, RPH       levothyroxine (SYNTHROID) tablet 50 mcg  50 mcg Oral Q0600 Mansy, Jan A, MD   50 mcg at 10/23/23 0913   magnesium hydroxide (MILK OF MAGNESIA) suspension 30 mL  30 mL Oral Daily PRN Mansy, Jan A, MD       magnesium sulfate IVPB 2 g 50 mL  2 g Intravenous Once Gillis Santa, MD       metoprolol succinate (TOPROL-XL) 24 hr tablet  25 mg  25 mg Oral BID Hudson, Caralyn, PA-C       multivitamin with minerals tablet 1 tablet  1 tablet Oral Daily Mansy, Jan A, MD   1 tablet at 10/23/23 0913   nitroGLYCERIN (NITROSTAT) SL tablet 0.4 mg  0.4 mg Sublingual Q5 min PRN Mansy, Jan A, MD       omega-3 acid ethyl esters (LOVAZA) capsule 1 g  1 g Oral Daily Mansy, Jan A, MD   1 g at 10/23/23 0913   ondansetron (ZOFRAN) tablet 4 mg  4 mg Oral Q6H PRN Mansy, Jan A, MD       Or   ondansetron [facility] [place]) injection 4 mg  4 mg Intravenous Q6H PRN Mansy, Jan A, MD       oxyCODONE (Oxy IR/ROXICODONE) immediate release tablet 5 mg  5 mg Oral Q4H PRN Mansy, Jan A, MD       pantoprazole (PROTONIX) injection 40 mg  40 mg Intravenous Q12H Gillis Santa, MD   40 mg at 10/23/23 0913   rosuvastatin (CRESTOR) tablet 40 mg  40 mg  Oral Daily Mansy, Jan A, MD   40 mg at 10/23/23 0913   senna-docusate (Senokot-S) tablet 1 tablet  1 tablet Oral BID Mansy, Jan A, MD   1 tablet at 10/23/23 0913   traZODone (DESYREL) tablet 25 mg  25 mg Oral QHS PRN Mansy, Vernetta Honey, MD       Current Outpatient Medications  Medication Sig Dispense Refill   acetaminophen (TYLENOL) 325 MG tablet Take 650 mg by mouth every 6 (six) hours as needed.     acyclovir (ZOVIRAX) 400 MG tablet Take 400 mg by mouth 2 (two) times daily. Given at 9 am and 9 pm     albuterol (VENTOLIN HFA) 108 (90 Base) MCG/ACT inhaler Inhale 2 puffs into the lungs every 6 (six) hours as needed for wheezing or shortness of breath. (Patient taking differently: Inhale 2 puffs into the lungs every 4 (four) hours as needed for wheezing or shortness of breath.) 1 each 1   allopurinol (ZYLOPRIM) 100 MG tablet Take 100 mg by mouth daily. Taken in the evening     ALPRAZolam (XANAX) 0.5 MG tablet Take 0.5 mg by mouth at bedtime as needed for sleep.      amLODipine (NORVASC) 10 MG tablet Take 1 tablet by mouth daily.     ammonium lactate (AMLACTIN) 12 % cream Apply 1 Application topically 2 (two) times daily. Apply to arms,  legs, and body one to two times daily     apixaban (ELIQUIS) 5 MG TABS tablet Take 2 tablets (10 mg total) by mouth 2 (two) times daily for 7 days, THEN 1 tablet (5 mg total) 2 (two) times daily. (Patient taking differently: Taken 2x daily, taken at 9 am and 9 pm ) 90 tablet 1   aspirin 81 MG chewable tablet Chew 81 mg by mouth daily.      benzonatate (TESSALON) 100 MG capsule Take 100 mg by mouth 2 (two) times daily as needed for cough.     budesonide-formoterol (SYMBICORT) 160-4.5 MCG/ACT inhaler Inhale 2 puffs into the lungs every 12 (twelve) hours.     bumetanide (BUMEX) 0.5 MG tablet Take 0.5 mg by mouth daily as needed.     Carboxymethylcellulose Sod PF 0.5 % SOLN Apply 1 drop to eye 3 (three) times daily as needed.     diphenhydrAMINE (BENADRYL ALLERGY) 25 mg capsule Take 1 capsule (25 mg total) by mouth every 6 (six) hours as needed. (Patient taking differently: Take 25 mg by mouth every 6 (six) hours as needed for itching, allergies or sleep. Not used often b/c it causes his prostate to swell per spouse) 30 capsule 0   docusate sodium (COLACE) 100 MG capsule Take 100 mg by mouth daily as needed for mild constipation or moderate constipation.     fluconazole (DIFLUCAN) 100 MG tablet Take 100 mg by mouth daily.     fluticasone (FLONASE) 50 MCG/ACT nasal spray Place 2 sprays into both nostrils daily as needed for allergies or rhinitis.     fluticasone-salmeterol (ADVAIR) 250-50 MCG/ACT AEPB Inhale 1 puff into the lungs 2 (two) times daily as needed.     furosemide (LASIX) 80 MG tablet Take 80 mg by mouth daily.     insulin aspart (NOVOLOG) 100 UNIT/ML injection Inject 5 Units into the skin 3 (three) times daily before meals.     insulin glargine-yfgn (SEMGLEE) 100 UNIT/ML injection Inject 0.15 mLs (15 Units total) into the skin daily. (Patient taking differently: Inject 14 Units into the skin at bedtime.) 10  mL 11   levothyroxine (SYNTHROID) 50 MCG tablet Take 50 mcg by mouth daily before  breakfast.      LORazepam (ATIVAN) 1 MG tablet Take 1 mg by mouth every 8 (eight) hours as needed.     metoprolol tartrate (LOPRESSOR) 25 MG tablet Take 25 mg by mouth QID. Taken at 6 am, 12 noon, 6 pm and 12 midnight     mirtazapine (REMERON) 7.5 MG tablet Take 7.5 mg by mouth at bedtime.     Multiple Vitamin (MULTIVITAMIN WITH MINERALS) TABS tablet Take 1 tablet by mouth daily.     nitroGLYCERIN (NITROSTAT) 0.4 MG SL tablet Place 0.4 mg under the tongue every 5 (five) minutes as needed for chest pain.      nystatin (MYCOSTATIN) 100000 UNIT/ML suspension Take 5 mLs by mouth 4 (four) times daily as needed.     oxyCODONE (OXY IR/ROXICODONE) 5 MG immediate release tablet Take 1 tablet (5 mg total) by mouth every 4 (four) hours as needed for breakthrough pain. 30 tablet 0   pantoprazole (PROTONIX) 40 MG tablet Take 40 mg by mouth daily.     potassium chloride SA (KLOR-CON M) 20 MEQ tablet Take 20 mEq by mouth daily.     predniSONE (DELTASONE) 50 MG tablet Take 50 mg by mouth as directed. Following chemo per spouse     rosuvastatin (CRESTOR) 40 MG tablet Take 40 mg by mouth at bedtime.     tamsulosin (FLOMAX) 0.4 MG CAPS capsule Take 0.4 mg by mouth daily.     Vitamin D, Ergocalciferol, (DRISDOL) 50000 units CAPS capsule Take 50,000 Units by mouth every 30 (thirty) days. Takes on 1st of each month     VITAMIN D-1000 MAX ST 25 MCG (1000 UT) tablet Take 1,000 Units by mouth daily.     colchicine 0.6 MG tablet Take 0.6 mg by mouth as needed (gout flares). (Patient not taking: Reported on 10/23/2023)     isosorbide mononitrate (IMDUR) 30 MG 24 hr tablet Take 1 tablet (30 mg total) by mouth daily. (Patient not taking: Reported on 10/23/2023) 30 tablet 0   Omega-3 Fatty Acids (FISH OIL) 1000 MG CAPS Take 1,000 mg by mouth daily.  (Patient not taking: Reported on 10/23/2023)     Semaglutide, 2 MG/DOSE, (OZEMPIC, 2 MG/DOSE,) 8 MG/3ML SOPN Inject 2 mg into the skin every Thursday. (Patient not taking: Reported  on 10/23/2023)     traZODone (DESYREL) 50 MG tablet Take 1 tablet by mouth at bedtime. (Patient not taking: Reported on 10/23/2023)      Allergies as of 10/22/2023 - Review Complete 10/22/2023  Allergen Reaction Noted   Antihistamines, chlorpheniramine-type Swelling 04/27/2014   Diphenhydramine hcl Swelling 12/07/2012   Fluorescein Dermatitis, Hives, Itching, and Other (See Comments) 12/07/2021   Iodinated contrast media Other (See Comments) 12/07/2021   Lisinopril Swelling 06/06/2023   Ezetimibe Hives and Other (See Comments) 04/27/2014   Furosemide Swelling 06/06/2023   Metformin Hives and Other (See Comments) 04/27/2014   Metoprolol Itching 01/24/2017   Nsaids Other (See Comments) 12/07/2021   Sulfa antibiotics Hives and Other (See Comments) 04/27/2014   Brilinta [ticagrelor] Other (See Comments) 08/13/2022   Diphenhydramine hcl Other (See Comments) 12/07/2012   Other Itching and Other (See Comments) 12/07/2012     Review of Systems:    All systems reviewed and negative except where noted in HPI.  Review of Systems  Constitutional:  Negative for chills and fever.  Respiratory:  Positive for shortness of breath.   Cardiovascular:  Positive for leg swelling.  Gastrointestinal:  Negative for abdominal pain, blood in stool, constipation, diarrhea, heartburn, melena, nausea and vomiting.  Neurological:  Negative for focal weakness.  Psychiatric/Behavioral:  Negative for substance abuse.   All other systems reviewed and are negative.      Physical Exam:  Vital signs in last 24 hours: Temp:  [97.9 F (36.6 C)-98.4 F (36.9 C)] 98 F (36.7 C) (12/12 1419) Pulse Rate:  [71-92] 88 (12/12 1419) Resp:  [16-28] 20 (12/12 1419) BP: (113-140)/(62-72) 128/66 (12/12 1419) SpO2:  [93 %-100 %] 98 % (12/12 1419) Weight:  [98.9 kg] 98.9 kg (12/11 1659)   General:   Pleasant in NAD Head:  Normocephalic and atraumatic. Eyes:   No icterus.   Conjunctiva pink. Mouth: Mucosa pink moist,  no lesions. Neck:  Supple; no masses felt Lungs:  No respiratory distress Abdomen:   Flat, soft, nondistended, nontender Msk:  Symmetrical without gross deformities. Neurologic:  No focal deficits Skin:  Warm, dry, pink without significant lesions or rashes. Psych:  Alert and cooperative. Normal affect.  LAB RESULTS: Recent Labs    10/22/23 1707 10/23/23 0500  WBC 20.1* 15.1*  HGB 7.3* 6.6*  HCT 23.0* 20.5*  PLT 264 266   BMET Recent Labs    10/22/23 1707 10/23/23 0500  NA 138 139  K 3.9 3.5  CL 101 104  CO2 25 28  GLUCOSE 202* 119*  BUN 27* 25*  CREATININE 2.21* 2.00*  CALCIUM 8.2* 8.2*   LFT No results for input(s): "PROT", "ALBUMIN", "AST", "ALT", "ALKPHOS", "BILITOT", "BILIDIR", "IBILI" in the last 72 hours. PT/INR No results for input(s): "LABPROT", "INR" in the last 72 hours.  STUDIES: ECHOCARDIOGRAM COMPLETE Result Date: 10/23/2023    ECHOCARDIOGRAM REPORT   Patient Name:   AZAN FAVER Coutant Date of Exam: 10/23/2023 Medical Rec #:  [id number]      Height:       72.0 in Accession #:    [id number]     Weight:       218.0 lb Date of Birth:  1951/11/07      BSA:          2.210 m Patient Age:    72 years       BP:           131/66 mmHg Patient Gender: M              HR:           84 bpm. Exam Location:  [facility] Procedure: 2D Echo, Color Doppler and Cardiac Doppler Indications:     CHF- acute diastolic I50.31  History:         Patient has prior history of Echocardiogram examinations, most                  recent 01/24/2017. CAD and Previous Myocardial Infarction,                  Stroke; Risk Factors:Diabetes.  Sonographer:     Cristela Blue Referring Phys:  [id number] JAN A MANSY Diagnosing Phys: Alwyn Pea MD  Sonographer Comments: Technically challenging study due to limited acoustic windows, no parasternal window and no subcostal window. IMPRESSIONS  1. Left ventricular ejection fraction, by estimation, is 55 to 60%. The left ventricle has normal function. The left ventricle  has no regional wall motion abnormalities. The left ventricular internal cavity size was moderately dilated. Left ventricular diastolic parameters were normal.  2. Right ventricular systolic function is  normal. The right ventricular size is normal.  3. The mitral valve is normal in structure. Moderate to severe mitral valve regurgitation.  4. Tricuspid valve regurgitation is mild to moderate.  5. The aortic valve is normal in structure. Aortic valve regurgitation is mild. FINDINGS  Left Ventricle: Left ventricular ejection fraction, by estimation, is 55 to 60%. The left ventricle has normal function. The left ventricle has no regional wall motion abnormalities. The left ventricular internal cavity size was moderately dilated. There is borderline left ventricular hypertrophy. Left ventricular diastolic parameters were normal. Right Ventricle: The right ventricular size is normal. No increase in right ventricular wall thickness. Right ventricular systolic function is normal. Left Atrium: Left atrial size was normal in size. Right Atrium: Right atrial size was normal in size. Pericardium: There is no evidence of pericardial effusion. Mitral Valve: The mitral valve is normal in structure. Moderate to severe mitral valve regurgitation. MV peak gradient, 6.2 mmHg. The mean mitral valve gradient is 3.0 mmHg. Tricuspid Valve: The tricuspid valve is grossly normal. Tricuspid valve regurgitation is mild to moderate. Aortic Valve: The aortic valve is normal in structure. Aortic valve regurgitation is mild. Aortic valve mean gradient measures 3.0 mmHg. Aortic valve peak gradient measures 4.0 mmHg. Aortic valve area, by VTI measures 3.32 cm. Pulmonic Valve: The pulmonic valve was normal in structure. Pulmonic valve regurgitation is trivial. Aorta: The ascending aorta was not well visualized. IAS/Shunts: No atrial level shunt detected by color flow Doppler.  LEFT VENTRICLE PLAX 2D LVIDd:         5.60 cm   Diastology LVIDs:          3.90 cm   LV e' medial:    6.31 cm/s LV PW:         1.00 cm   LV E/e' medial:  17.6 LV IVS:        1.20 cm   LV e' lateral:   9.57 cm/s LVOT diam:     2.00 cm   LV E/e' lateral: 11.6 LV SV:         65 LV SV Index:   29 LVOT Area:     3.14 cm  RIGHT VENTRICLE RV Basal diam:  3.50 cm RV Mid diam:    3.10 cm LEFT ATRIUM             Index        RIGHT ATRIUM           Index LA diam:        3.90 cm 1.76 cm/m   RA Area:     19.60 cm LA Vol (A2C):   65.4 ml 29.59 ml/m  RA Volume:   59.30 ml  26.83 ml/m LA Vol (A4C):   65.3 ml 29.55 ml/m LA Biplane Vol: 69.2 ml 31.31 ml/m  AORTIC VALVE AV Area (Vmax):    3.55 cm AV Area (Vmean):   2.64 cm AV Area (VTI):     3.32 cm AV Vmax:           100.00 cm/s AV Vmean:          75.100 cm/s AV VTI:            0.195 m AV Peak Grad:      4.0 mmHg AV Mean Grad:      3.0 mmHg LVOT Vmax:         113.00 cm/s LVOT Vmean:        63.100 cm/s LVOT VTI:  0.206 m LVOT/AV VTI ratio: 1.06  AORTA Ao Root diam: 3.80 cm MITRAL VALVE                TRICUSPID VALVE MV Area (PHT): 4.41 cm     TR Peak grad:   6.8 mmHg MV Area VTI:   2.78 cm     TR Vmax:        130.00 cm/s MV Peak grad:  6.2 mmHg MV Mean grad:  3.0 mmHg     SHUNTS MV Vmax:       1.24 m/s     Systemic VTI:  0.21 m MV Vmean:      82.0 cm/s    Systemic Diam: 2.00 cm MV Decel Time: 172 msec MV E velocity: 111.00 cm/s MV A velocity: 79.10 cm/s MV E/A ratio:  1.40 Alwyn Pea MD Electronically signed by Alwyn Pea MD Signature Date/Time: 10/23/2023/1:43:44 PM    Final    DG Chest 2 View Result Date: 10/22/2023 CLINICAL DATA:  Chest pain EXAM: CHEST - 2 VIEW COMPARISON:  X-ray 07/31/2023. FINDINGS: Enlarged cardiopericardial silhouette with vascular congestion. Mild edema. Small bilateral pleural effusions. No pneumothorax. No consolidation. Degenerative changes seen along the spine. IMPRESSION: Postop chest with enlarged heart and vascular congestion. Mild edema suggested small effusions. Electronically Signed    By: Karen Kays M.D.   On: 10/22/2023 18:45       Impression / Plan:   72 y.o. 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 Suspect anemia is multifactorial from recent chemotherapy (multiple meds on his regimen have anemia as a side effect) and heart failure exacerbation. Given no overt GI bleeding, recent colonoscopy and endoscopy, will not plan on any GI procedures.  - recommend transfusion to keep hemoglobin > 8 - can continue PPI - no contraindication to restarting DOAC although upper extremity DVT's are less likely to migrate - monitor for any overt GI bleeding - continue treatment for heart failure exacerbation as recommended by cardiology  Please call with any questions or concerns.  Merlyn Lot MD, MPH [facility] GI

## 2023-10-23 NOTE — Assessment & Plan Note (Signed)
-   We will continue anti-hypertensive therapy. 

## 2023-10-23 NOTE — Assessment & Plan Note (Signed)
-   We will continue statin therapy. 

## 2023-10-23 NOTE — Assessment & Plan Note (Signed)
-   The patient will be placed on supplement coverage with NovoLog. ?- We will continue basal coverage. ? ?

## 2023-10-24 DIAGNOSIS — I5033 Acute on chronic diastolic (congestive) heart failure: Secondary | ICD-10-CM | POA: Diagnosis not present

## 2023-10-24 LAB — CBC
HCT: 22.3 % — ABNORMAL LOW (ref 39.0–52.0)
Hemoglobin: 7.2 g/dL — ABNORMAL LOW (ref 13.0–17.0)
MCH: 30.3 pg (ref 26.0–34.0)
MCHC: 32.3 g/dL (ref 30.0–36.0)
MCV: 93.7 fL (ref 80.0–100.0)
Platelets: 309 10*3/uL (ref 150–400)
RBC: 2.38 MIL/uL — ABNORMAL LOW (ref 4.22–5.81)
RDW: 20.6 % — ABNORMAL HIGH (ref 11.5–15.5)
WBC: 15.6 10*3/uL — ABNORMAL HIGH (ref 4.0–10.5)
nRBC: 0.1 % (ref 0.0–0.2)

## 2023-10-24 LAB — CBG MONITORING, ED
Glucose-Capillary: 101 mg/dL — ABNORMAL HIGH (ref 70–99)
Glucose-Capillary: 113 mg/dL — ABNORMAL HIGH (ref 70–99)

## 2023-10-24 LAB — BASIC METABOLIC PANEL
Anion gap: 6 (ref 5–15)
BUN: 24 mg/dL — ABNORMAL HIGH (ref 8–23)
CO2: 30 mmol/L (ref 22–32)
Calcium: 8.7 mg/dL — ABNORMAL LOW (ref 8.9–10.3)
Chloride: 103 mmol/L (ref 98–111)
Creatinine, Ser: 2 mg/dL — ABNORMAL HIGH (ref 0.61–1.24)
GFR, Estimated: 35 mL/min — ABNORMAL LOW (ref >60)
Glucose, Bld: 121 mg/dL — ABNORMAL HIGH (ref 70–99)
Potassium: 3.6 mmol/L (ref 3.5–5.1)
Sodium: 139 mmol/L (ref 135–145)

## 2023-10-24 LAB — GLUCOSE, CAPILLARY: Glucose-Capillary: 166 mg/dL — ABNORMAL HIGH (ref 70–99)

## 2023-10-24 LAB — PREPARE RBC (CROSSMATCH)

## 2023-10-24 LAB — MAGNESIUM: Magnesium: 1.8 mg/dL (ref 1.7–2.4)

## 2023-10-24 LAB — PHOSPHORUS: Phosphorus: 3.5 mg/dL (ref 2.5–4.6)

## 2023-10-24 MED ORDER — APIXABAN 5 MG PO TABS
5.0000 mg | ORAL_TABLET | Freq: Two times a day (BID) | ORAL | Status: DC
  Administered 2023-10-24 – 2023-10-28 (×9): 5 mg via ORAL
  Filled 2023-10-24 (×9): qty 1

## 2023-10-24 MED ORDER — POLYSACCHARIDE IRON COMPLEX 150 MG PO CAPS
150.0000 mg | ORAL_CAPSULE | Freq: Every day | ORAL | Status: DC
  Administered 2023-10-24 – 2023-10-28 (×5): 150 mg via ORAL
  Filled 2023-10-24 (×5): qty 1

## 2023-10-24 MED ORDER — PANTOPRAZOLE SODIUM 40 MG PO TBEC
40.0000 mg | DELAYED_RELEASE_TABLET | Freq: Every day | ORAL | Status: DC
  Administered 2023-10-24 – 2023-10-28 (×5): 40 mg via ORAL
  Filled 2023-10-24 (×5): qty 1

## 2023-10-24 MED ORDER — ASPIRIN 81 MG PO CHEW
81.0000 mg | CHEWABLE_TABLET | Freq: Every day | ORAL | Status: DC
  Administered 2023-10-24 – 2023-10-28 (×5): 81 mg via ORAL
  Filled 2023-10-24 (×5): qty 1

## 2023-10-24 MED ORDER — DAPAGLIFLOZIN PROPANEDIOL 10 MG PO TABS
10.0000 mg | ORAL_TABLET | Freq: Every day | ORAL | Status: DC
  Administered 2023-10-25 – 2023-10-28 (×4): 10 mg via ORAL
  Filled 2023-10-24 (×4): qty 1

## 2023-10-24 MED ORDER — SODIUM CHLORIDE 0.9 % IV SOLN: Freq: Once | INTRAVENOUS | Status: AC

## 2023-10-24 NOTE — ED Notes (Signed)
Pt notified of double room prior to departing ED.

## 2023-10-24 NOTE — ED Notes (Signed)
Pt given a bedside commode per request

## 2023-10-24 NOTE — Plan of Care (Signed)

## 2023-10-24 NOTE — Progress Notes (Signed)
Marinus Maw admitted to room 240B. Telemetry box placed and CCMD called by RN and second verifier. Vitals collected. Admission navigator complete. Family at bedside.Cliffton Asters board updated, Lupe Carney, RN notified and report given. Patient expresses no current needs.

## 2023-10-24 NOTE — Progress Notes (Signed)
Heart Failure Stewardship Pharmacy Note  PCP: Erasmo Downer, NP PCP-Cardiologist: None  HPI: Roberto Ingram is a 72 y.o. male with CAD s/p PTCA in 1994 and CABG in 2007, arthritis, BPH, anaplastic large cell lymphoma, gastric wedge resection of stromal tumor, CVA, T2DM, glaucoma, gout, HLD, OSA on CPAP, seizures, hypothyroidism, CKD, and diastolic heart failure who presented with midsternal chest pain, dyspnea, orthopnea, and LEE.   Pertinent cardiac history: AMI with balloon angioplasty D1 in 1994. CABG x 4 in 2007. Echo in 2007 with normal EF and G1DD. CVA in 2019 and 2021. Echo in 01/2017 with LVEF 60-65%, mild LVH, grade I diastolic dysfunction. Echo this admission pending.  Pertinent Lab Values: Creatinine, Ser  Date Value Ref Range Status  10/24/2023 2.00 (H) 0.61 - 1.24 mg/dL Final   BUN  Date Value Ref Range Status  10/24/2023 24 (H) 8 - 23 mg/dL Final   Potassium  Date Value Ref Range Status  10/24/2023 3.6 3.5 - 5.1 mmol/L Final   Sodium  Date Value Ref Range Status  10/24/2023 139 135 - 145 mmol/L Final   B Natriuretic Peptide  Date Value Ref Range Status  10/22/2023 779.4 (H) 0.0 - 100.0 pg/mL Final    Comment:    Performed at [facility], [address] [place], [place] [address]   Magnesium  Date Value Ref Range Status  10/24/2023 1.8 1.7 - 2.4 mg/dL Final    Comment:    Performed at [facility], [address] [place], [place] [address]   Hgb A1c MFr Bld  Date Value Ref Range Status  07/19/2023 7.8 (H) 4.8 - 5.6 % Final    Comment:    (NOTE) Pre diabetes:          5.7%-6.4%  Diabetes:              >6.4%  Glycemic control for   <7.0% adults with diabetes    TSH  Date Value Ref Range Status  07/31/2023 4.276 0.350 - 4.500 uIU/mL Final    Comment:    Performed by a 3rd Generation assay with a functional sensitivity of <=0.01 uIU/mL. Performed at [facility], [address] [place], [place] [address]     LDH  Date Value Ref Range Status  07/21/2023 179 98 - 192 U/L Final    Comment:    Performed at [facility], [address] [place], [place] [address]    Vital Signs:  Temp:  [98 F (36.7 C)-98.4 F (36.9 C)] 98 F (36.7 C) (12/13 0400) Pulse Rate:  [79-99] 80 (12/13 0400) Cardiac Rhythm: Normal sinus rhythm (12/12 1231) Resp:  [18-32] 21 (12/13 0400) BP: (115-174)/(61-82) 115/82 (12/13 0400) SpO2:  [96 %-100 %] 100 % (12/13 0400) FiO2 (%):  [2 %] 2 % (12/13 0400)  Intake/Output Summary (Last 24 hours) at 10/24/2023 0748 Last data filed at 10/23/2023 1839 Gross per 24 hour  Intake 330 ml  Output 250 ml  Net 80 ml    Current Heart Failure Medications:  Loop diuretic: furosemide 480 mg IV BID Beta-Blocker: metoprolol tartrate 50 mg bid ACEI/ARB/ARNI: none MRA: none SGLT2i: Farxiga 10 mg daily Other: isosorbide 30 mg daily  Prior to admission Heart Failure Medications:  Loop diuretic: furosemide 80 mg daily Beta-Blocker: metoprolol tartrate 50 mg bid ACEI/ARB/ARNI: none MRA: none SGLT2i: none Other: isosorbide 30 mg daily, Ozempic 2 mg  Assessment: 1. Acute on chronic diastolic heart failure (LVEF most recently 60-65%) with grade I diastolic  dysfunction, due to unknown etiology. NYHA class III symptoms.  -Symptoms: Reports significant improvement in SOB and LEE. -Volume: Still mildly hypervolemic on exam. Agree with continuing IV furosemide. Given chronic CKD, may benefit from torsemide at discharge for better absorption. -Hemodynamics: BP elevated today. Taking amlodipine 10 mg daily at home -SGLT2i: Adding Farxiga 10 mg daily for HFpEF and CKD.  -MRA: Not a good candidate for spironolactone at this time given creatinine. -ARNI: Not an excellent candidate for ARNI at this time.  Plan: 1) Medication changes recommended at this time: -Agree with starting Farxiga 10 mg daily  2) Patient assistance: -Jardiance copay is $143.45 and Marcelline Deist is $15.00    3) Education: - Patient has been educated on current HF medications and potential additions to HF medication regimen - Patient verbalizes understanding that over the next few months, these medication doses may change and more medications may be added to optimize HF regimen - Patient has been educated on basic disease state pathophysiology and goals of therapy  Medication Assistance / Insurance Benefits Check: Does the patient have prescription insurance?    Type of insurance plan:  Does the patient qualify for medication assistance through manufacturers or grants? Pending  Eligible grants and/or patient assistance programs: pending  Medication assistance applications in progress: pending  Medication assistance applications approved: pending Approved medication assistance renewals will be completed by: pending  Outpatient Pharmacy: Prior to admission outpatient pharmacy: [facility]     Please do not hesitate to reach out with questions or concerns,  Enos Fling, PharmD, CPP, BCPS Heart Failure Pharmacist  Phone - [phone] 10/24/2023 7:48 AM

## 2023-10-24 NOTE — Progress Notes (Signed)
[facility] Progress Note  Patient: Roberto Ingram    MVH:[id number]  DOA: 10/23/2023     Date of Service: the patient was seen and examined on 10/24/2023  Chief Complaint  Patient presents with   Leg Swelling   Brief hospital course: NAHZIR SCHULT is a 72 y.o. African-American male with PMH of CAD status post CABGx7 in 2007, recently diagnosed anaplastic large cell lymphoma, GIST tumor, CKD stage IV, HTN, gout, IDDM, COPD, BPH, chronic pain syndrome and narcotic dependence, as reviewed from EMR, presented to [facility] ED with c/o chest pain mild in intensity with no radiation as well as dyspnea, orthopnea and worsening lower extremity edema.  Denied any other complaints, no GI bleeding.   ED Course: When she comes to the ER when she came to the ER BP was 140/62 with otherwise normal vital signs.  Labs revealed blood glucose of 202 and a BUN of 27 and creatinine of 2.21.  BNP was 779.4 with a sensitive troponin I of 28 and later 37.  CBC showed leukocytosis 20.1 and anemia slightly worse than previous levels. EKG as reviewed by me : ..  EKG showed sinus rhythm with rate of 89 with LVH, lateral T wave inversion with ST segment depression. Imaging: Two-view chest x-ray showed cardiomegaly and vascular congestion with mild edema and suggested small pleural effusions.   The patient was given 40 mg of IV Lasix and 50 mg p.o. Lopressor.  He will be admitted to a progressive bed for further evaluation and management.   Assessment and Plan:  Acute on chronic diastolic CHF (congestive heart failure) - This is based on a 2D echo with EF of 60 to 65% and grade 1 diastolic dysfunction in 2018. - continue diuresis with IV Lasix 80 BID 12/12 TTE shows LVEF 55 to 60%, moderate to severe MR and mild to moderate TR Follow cardiology consult for further recommendation  Symptomatic anemia, unknown cause could be bone marrow suppression due to chemotherapy versus GI bleeding due to history of GIST  tumor 12/12 Hb 6.6, transfuse 1 unit of PRBC 12/13 Hb 7.2, still at lower end, transfuse 1 unit of PRBC S/p PPI 40 mg IV BID, transition to pantoprazole 40 mg p.o. daily  reticulocyte count 4.8, elevated anemia workup, transferrin saturation 14%, folate within normal range, B12 elevated (no need of B12 supplement) Started oral iron supplement with vitamin C GI consulted, recommended no intervention, anemia most likely due to underlying malignancy. 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 twice daily Patient will probably need anticoagulation lifelong due to history of GIST tumor, remains at high risk for recurrent DVT 12/13 held Eliquis for possible GI bleed but resumed today  CAD s/p CABG 2007 Continue statin 12/13 resumed aspirin 80 mg p.o. daily  Hypomagnesemia, mag repleted. Monitor electrolytes and replete as needed.  Dyslipidemia: continue statin therapy.   Hypothyroidism: continue Synthroid.   Type 2 diabetes mellitus without complication, with long-term current use of insulin (HCC) Resumed home dose basal 14 units nightly NovoLog sliding scale, monitor CBG, continue diabetic diet  Asthma, chronic - We will continue his albuterol and hold off long-acting beta agonist.   Essential hypertension - We will continue antihypertensive therapy.   Body mass index is 29.57 kg/m.  Interventions:  Diet: Heart healthy diet and diabetic diet.  Fluid restriction 1.5 L/day DVT Prophylaxis: Therapeutic Anticoagulation with Eliquis    Advance goals of care discussion: Full  code  Family Communication: family was present at bedside, at the time of interview.  The pt provided permission to discuss medical plan with the family. Opportunity was given to ask question and all questions were answered satisfactorily.   Disposition:  Pt is from home, admitted with CHF exacerbation, anasarca, anemia, still has edema on IV Lasix,  which precludes a safe discharge. Discharge to home, when stable and cleared by cardiology..  Subjective: No significant events overnight, patient feels improvement in the shortness of breath, lower extremity edema is also improving.  Patient denied any chest pain or palpitation, no nasal complaints.   Physical Exam: General: NAD, lying comfortably Appear in no distress, affect appropriate Eyes: PERRLA ENT: Oral Mucosa Clear, moist  Neck: no JVD,  Cardiovascular: S1 and S2 Present, audible murmur,  Respiratory: Equal air entry bilaterally, no wheezing, mild bibasilar crackles Abdomen: Bowel Sound present, Soft and no tenderness,  Skin: no rashes Extremities: 2+ pedal edema, no calf tenderness.  Edema significantly improved Neurologic: without any new focal findings Gait not checked due to patient safety concerns  Vitals:   10/24/23 1130 10/24/23 1230 10/24/23 1300 10/24/23 1429  BP: 139/62 (!) 140/64 (!) 140/62 135/71  Pulse: 82 84 82 80  Resp: (!) 23 (!) 23 (!) 22 14  Temp:   98.1 F (36.7 C) 97.8 F (36.6 C)  TempSrc:   Oral Oral  SpO2: 100% 96% 97% 96%  Weight:      Height:        Intake/Output Summary (Last 24 hours) at 10/24/2023 1531 Last data filed at 10/24/2023 1402 Gross per 24 hour  Intake 525.42 ml  Output 1425 ml  Net -899.58 ml   Filed Weights   10/22/23 1659  Weight: 98.9 kg    Data Reviewed: I have personally reviewed and interpreted daily labs, tele strips, imagings as discussed above. I reviewed all nursing notes, pharmacy notes, vitals, pertinent old records I have discussed plan of care as described above with RN and patient/family.  CBC: Recent Labs  Lab 10/22/23 1707 10/23/23 0500 10/24/23 0547  WBC 20.1* 15.1* 15.6*  HGB 7.3* 6.6* 7.2*  HCT 23.0* 20.5* 22.3*  MCV 98.7 98.1 93.7  PLT 264 266 309   Basic Metabolic Panel: Recent Labs  Lab 10/22/23 1707 10/23/23 0500 10/24/23 0547  NA 138 139 139  K 3.9 3.5 3.6  CL 101 104 103   CO2 25 28 30   GLUCOSE 202* 119* 121*  BUN 27* 25* 24*  CREATININE 2.21* 2.00* 2.00*  CALCIUM 8.2* 8.2* 8.7*  MG  --  1.5* 1.8  PHOS  --  3.2 3.5    Studies: No results found.   Scheduled Meds:  allopurinol  100 mg Oral Daily   apixaban  5 mg Oral BID   ascorbic acid  500 mg Oral Daily   aspirin  81 mg Oral Daily   [START ON 10/25/2023] dapagliflozin propanediol  10 mg Oral Daily   furosemide  80 mg Intravenous BID   insulin aspart  0-5 Units Subcutaneous QHS   insulin aspart  0-9 Units Subcutaneous TID WC   insulin glargine-yfgn  14 Units Subcutaneous QHS   iron polysaccharides  150 mg Oral Daily   levothyroxine  50 mcg Oral Q0600   metoprolol succinate  25 mg Oral BID   multivitamin with minerals  1 tablet Oral Daily   omega-3 acid ethyl esters  1 g Oral Daily   pantoprazole  40 mg Oral Daily   polyethylene  glycol  17 g Oral BID   rosuvastatin  40 mg Oral Daily   Continuous Infusions:   PRN Meds: acetaminophen **OR** acetaminophen, albuterol, ALPRAZolam, bisacodyl, bisacodyl, colchicine, fluticasone, magnesium hydroxide, nitroGLYCERIN, ondansetron **OR** ondansetron (ZOFRAN) IV, oxyCODONE, traZODone  Time spent: 55 minutes  Author: Gillis Santa. MD [facility] 10/24/2023 3:31 PM  To reach On-call, see care teams to locate the attending and reach out to them via [email or web address]. If 7PM-7AM, please contact night-coverage If you still have difficulty reaching the attending provider, please page the [facility] (Director on Call) for [facility] on amion for assistance.

## 2023-10-24 NOTE — ED Notes (Signed)
Pharmacist Irving Burton was contacted to verify that Lasix could be given. Patient has had two doses here.

## 2023-10-24 NOTE — Progress Notes (Signed)
Heart Failure Navigator Progress Note  Assessed for Heart & Vascular TOC clinic readiness.  Does not meet criteria due to [facility] patient.   Navigator will sign off at this time.  Roxy Horseman, RN, BSN [facility] Heart Failure Navigator Secure Chat Only

## 2023-10-24 NOTE — Plan of Care (Signed)
  Problem: Education: Goal: Ability to describe self-care measures that may prevent or decrease complications (Diabetes Survival Skills Education) will improve Outcome: Progressing Goal: Individualized Educational Video(s) Outcome: Progressing   Problem: Coping: Goal: Ability to adjust to condition or change in health will improve Outcome: Not Progressing   Problem: Fluid Volume: Goal: Ability to maintain a balanced intake and output will improve Outcome: Not Progressing   Problem: Health Behavior/Discharge Planning: Goal: Ability to identify and utilize available resources and services will improve Outcome: Not Progressing

## 2023-10-24 NOTE — Progress Notes (Signed)
[facility] CLINIC CARDIOLOGY PROGRESS NOTE       Patient ID: Roberto Ingram MRN: [id number] DOB/AGE: 1951-05-24 72 y.o.  Admit date: 10/23/2023 Referring Physician Dr. Gillis Santa Primary Physician [place] Ouch, Alleen Borne, NP  Primary Cardiologist Dr. Marcina Millard Reason for Consultation AoCHF  HPI: Roberto Ingram is a 72 y.o. male  with a past medical history of coronary artery disease s/p CABGx4 in 2007, hypertension, hyperlipidemia, type II diabetes, hx CVA, hypothyroidism, asthma, chronic kidney disease stage IIIa, anaplastic large cell lymphoma, malignant gastrointestinal stromal tumor who presented to the ED on 10/23/2023 for leg swelling, shortness of breath. Cardiology was consulted for further evaluation.   Interval history: -Patient reports he is feeling better this AM. He was transfused yesterday. -SOB has improved with diuresis, he reports great UOP with lasix. -BP and HR are stable, he denies any chest pain or palpitations.   Review of systems complete and found to be negative unless listed above    Past Medical History:  Diagnosis Date   Anemia    Anxiety    Generalized anxiety disorder   Asthmatic bronchitis    Atopic dermatitis    BPH (benign prostatic hyperplasia)    Cardiovascular disease    Carotid disease, bilateral (HCC)    Colon polyp    Constipation    Coronary artery disease    Diabetes mellitus without complication (HCC)    Diabetic retinopathy (HCC)    GERD (gastroesophageal reflux disease)    Glaucoma of left eye    Gout    History of GI bleed    Hypercholesterolemia    Hypertension    Hypomagnesemia    Hypothyroidism    Insomnia    Mixed hyperlipidemia    Myocardial infarction (HCC)    Pneumonia    Renal disorder    Renal insufficiency    Sleep apnea    wears oxygen at night (unable to tolerate CPAP)   Stroke (HCC)    Stroke [facility])    Thyroid disease    Vitamin D deficiency     Past Surgical History:  Procedure Laterality Date    COLONOSCOPY     COLONOSCOPY WITH PROPOFOL N/A 02/24/2018   Procedure: COLONOSCOPY WITH PROPOFOL;  Surgeon: Toledo, Boykin Nearing, MD;  Location: [facility] ENDOSCOPY;  Service: Gastroenterology;  Laterality: N/A;   COLONOSCOPY WITH PROPOFOL N/A 08/06/2022   Procedure: COLONOSCOPY WITH PROPOFOL;  Surgeon: Regis Bill, MD;  Location: [facility] ENDOSCOPY;  Service: Gastroenterology;  Laterality: N/A;   CORONARY ANGIOPLASTY     CORONARY ARTERY BYPASS GRAFT     ESOPHAGOGASTRODUODENOSCOPY     EXPLORATION POST OPERATIVE OPEN HEART     EYE SURGERY     Lens eye surgery   INGUINAL LYMPH NODE BIOPSY Left 07/22/2023   Procedure: LEFT AXILLARY LYMPH NODE BIOPSY;  Surgeon: Carolan Shiver, MD;  Location: [facility] ORS;  Service: General;  Laterality: Left;   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)   07/2023   NASAL POLYP EXCISION  1994   POLYPECTOMY     Quadruple bypass      (Not in a hospital admission)  Social History   Socioeconomic History   Marital status: Married    Spouse name: Sheralyn Boatman   Number of children: 3   Years of education: 12   Highest education level: High school graduate  Occupational History   Occupation: retired  Tobacco Use   Smoking status: Former    Current packs/day: 0.00    Types: Cigarettes  Quit date: 06/11/1993    Years since quitting: 30.3   Smokeless tobacco: Never  Vaping Use   Vaping status: Never Used  Substance and Sexual Activity   Alcohol use: No   Drug use: No   Sexual activity: Not Currently    Comment: Married   Other Topics Concern   Not on file  Social History Narrative   Not on file   Social Drivers of Health   Financial Resource Strain: Low Risk  (09/16/2023)   Received from [facility]   Overall Financial Resource Strain (CARDIA)    Difficulty of Paying Living Expenses: Not very hard  Food Insecurity: Food Insecurity Present (09/16/2023)   Received from [facility]    Hunger Vital Sign    Worried About Running Out of Food in the Last Year: Never true    Ran Out of Food in the Last Year: Sometimes true  Transportation Needs: No Transportation Needs (09/17/2023)   [facility] - [facility]    In the past 12 months, has lack of transportation kept you from medical appointments or from getting medications?: No    Lack of Transportation (Non-Medical): No  Recent Concern: Transportation Needs - Unmet Transportation Needs (08/31/2023)   [facility]    In the past 12 months, has lack of transportation kept you from medical appointments or from getting medications?: Yes    Lack of Transportation (Non-Medical): No  Physical Activity: Insufficiently Active (03/04/2018)   Exercise Vital Sign    Days of Exercise per Week: 2 days    Minutes of Exercise per Session: 10 min  Stress: No Stress Concern Present (03/04/2018)   [employer] of Occupational Health - Occupational Stress Questionnaire    Feeling of Stress : Only a little  Social Connections: Socially Integrated (03/04/2018)   Social Connection and Isolation Panel [NHANES]    Frequency of Communication with Friends and Family: More than three times a week    Frequency of Social Gatherings with Friends and Family: Once a week    Attends Religious Services: More than 4 times per year    Active Member of Clubs or Organizations: Yes    Attends Banker Meetings: More than 4 times per year    Marital Status: Married  Catering manager Violence: Not At Risk (03/04/2018)   Humiliation, Afraid, Rape, and Kick questionnaire    Fear of Current or Ex-Partner: No    Emotionally Abused: No    Physically Abused: No    Sexually Abused: No    Family History  Problem Relation Age of Onset   Atrial fibrillation Mother    Heart disease Mother    Stroke Mother    Bell's palsy Mother    Kidney disease  Mother    Cancer Mother    Diabetes Father    Arthritis Father    Stroke Brother    Diabetes Brother    Lung cancer Brother    Heart attack Brother    Cancer Brother    Heart disease Sister    COPD Sister    Throat cancer Sister      Vitals:   10/24/23 0830 10/24/23 1007 10/24/23 1027 10/24/23 1030  BP:  131/65 133/66 122/74  Pulse: 88 78 77 77  Resp: (!) 23 18 18  (!) 22  Temp:  98.3 F (36.8 C) 98 F (36.7 C)   TempSrc:  Oral Oral   SpO2: 100% 100% 100% 100%  Weight:      Height:        PHYSICAL EXAM General: Well-appearing elderly male, well nourished, in no acute distress sitting upright in ED stretcher with wife present at bedside. HEENT: Normocephalic and atraumatic. Neck: No JVD.  Lungs: Normal respiratory effort on room air. Clear bilaterally to auscultation.  Bilateral crackles. Heart: HRRR. Normal S1 and S2, + systolic murmur. Abdomen: Non-distended appearing.  Msk: Normal strength and tone for age. Extremities: Warm and well perfused. No clubbing, cyanosis.  1+ pitting edema.  Neuro: Alert and oriented X 3. Psych: Answers questions appropriately.   Labs: Basic Metabolic Panel: Recent Labs    10/23/23 0500 10/24/23 0547  NA 139 139  K 3.5 3.6  CL 104 103  CO2 28 30  GLUCOSE 119* 121*  BUN 25* 24*  CREATININE 2.00* 2.00*  CALCIUM 8.2* 8.7*  MG 1.5* 1.8  PHOS 3.2 3.5   Liver Function Tests: No results for input(s): "AST", "ALT", "ALKPHOS", "BILITOT", "PROT", "ALBUMIN" in the last 72 hours. No results for input(s): "LIPASE", "AMYLASE" in the last 72 hours. CBC: Recent Labs    10/23/23 0500 10/24/23 0547  WBC 15.1* 15.6*  HGB 6.6* 7.2*  HCT 20.5* 22.3*  MCV 98.1 93.7  PLT 266 309   Cardiac Enzymes: Recent Labs    10/22/23 1707 10/23/23 0029 10/23/23 0500  CKTOTAL  --   --  40*  TROPONINIHS 28* 37*  --    BNP: Recent Labs    10/22/23 1707  BNP 779.4*   D-Dimer: No results for input(s): "DDIMER" in the last 72  hours. Hemoglobin A1C: No results for input(s): "HGBA1C" in the last 72 hours. Fasting Lipid Panel: No results for input(s): "CHOL", "HDL", "LDLCALC", "TRIG", "CHOLHDL", "LDLDIRECT" in the last 72 hours. Thyroid Function Tests: No results for input(s): "TSH", "T4TOTAL", "T3FREE", "THYROIDAB" in the last 72 hours.  Invalid input(s): "FREET3" Anemia Panel: Recent Labs    10/23/23 0500 10/23/23 0919  VITAMINB12  --  2,643*  FOLATE 14.1  --   TIBC 230*  --   IRON 32*  --   RETICCTPCT 4.8*  --      Radiology: ECHOCARDIOGRAM COMPLETE Result Date: 10/23/2023    ECHOCARDIOGRAM REPORT   Patient Name:   RASHON PLOURD Rosen Date of Exam: 10/23/2023 Medical Rec #:  [id number]      Height:       72.0 in Accession #:    [id number]     Weight:       218.0 lb Date of Birth:  12-Dec-1950      BSA:          2.210 m Patient Age:    72 years       BP:           131/66 mmHg Patient Gender: M              HR:           84 bpm. Exam Location:  [facility] Procedure: 2D Echo, Color Doppler and Cardiac Doppler Indications:     CHF- acute diastolic I50.31  History:         Patient has prior history of Echocardiogram examinations, most                  recent 01/24/2017. CAD and Previous Myocardial Infarction,                  Stroke;  Risk Factors:Diabetes.  Sonographer:     Cristela Blue Referring Phys:  [id number] JAN A MANSY Diagnosing Phys: Alwyn Pea MD  Sonographer Comments: Technically challenging study due to limited acoustic windows, no parasternal window and no subcostal window. IMPRESSIONS  1. Left ventricular ejection fraction, by estimation, is 55 to 60%. The left ventricle has normal function. The left ventricle has no regional wall motion abnormalities. The left ventricular internal cavity size was moderately dilated. Left ventricular diastolic parameters were normal.  2. Right ventricular systolic function is normal. The right ventricular size is normal.  3. The mitral valve is normal in structure. Moderate to severe  mitral valve regurgitation.  4. Tricuspid valve regurgitation is mild to moderate.  5. The aortic valve is normal in structure. Aortic valve regurgitation is mild. FINDINGS  Left Ventricle: Left ventricular ejection fraction, by estimation, is 55 to 60%. The left ventricle has normal function. The left ventricle has no regional wall motion abnormalities. The left ventricular internal cavity size was moderately dilated. There is borderline left ventricular hypertrophy. Left ventricular diastolic parameters were normal. Right Ventricle: The right ventricular size is normal. No increase in right ventricular wall thickness. Right ventricular systolic function is normal. Left Atrium: Left atrial size was normal in size. Right Atrium: Right atrial size was normal in size. Pericardium: There is no evidence of pericardial effusion. Mitral Valve: The mitral valve is normal in structure. Moderate to severe mitral valve regurgitation. MV peak gradient, 6.2 mmHg. The mean mitral valve gradient is 3.0 mmHg. Tricuspid Valve: The tricuspid valve is grossly normal. Tricuspid valve regurgitation is mild to moderate. Aortic Valve: The aortic valve is normal in structure. Aortic valve regurgitation is mild. Aortic valve mean gradient measures 3.0 mmHg. Aortic valve peak gradient measures 4.0 mmHg. Aortic valve area, by VTI measures 3.32 cm. Pulmonic Valve: The pulmonic valve was normal in structure. Pulmonic valve regurgitation is trivial. Aorta: The ascending aorta was not well visualized. IAS/Shunts: No atrial level shunt detected by color flow Doppler.  LEFT VENTRICLE PLAX 2D LVIDd:         5.60 cm   Diastology LVIDs:         3.90 cm   LV e' medial:    6.31 cm/s LV PW:         1.00 cm   LV E/e' medial:  17.6 LV IVS:        1.20 cm   LV e' lateral:   9.57 cm/s LVOT diam:     2.00 cm   LV E/e' lateral: 11.6 LV SV:         65 LV SV Index:   29 LVOT Area:     3.14 cm  RIGHT VENTRICLE RV Basal diam:  3.50 cm RV Mid diam:    3.10 cm  LEFT ATRIUM             Index        RIGHT ATRIUM           Index LA diam:        3.90 cm 1.76 cm/m   RA Area:     19.60 cm LA Vol (A2C):   65.4 ml 29.59 ml/m  RA Volume:   59.30 ml  26.83 ml/m LA Vol (A4C):   65.3 ml 29.55 ml/m LA Biplane Vol: 69.2 ml 31.31 ml/m  AORTIC VALVE AV Area (Vmax):    3.55 cm AV Area (Vmean):   2.64 cm AV Area (VTI):     3.32  cm AV Vmax:           100.00 cm/s AV Vmean:          75.100 cm/s AV VTI:            0.195 m AV Peak Grad:      4.0 mmHg AV Mean Grad:      3.0 mmHg LVOT Vmax:         113.00 cm/s LVOT Vmean:        63.100 cm/s LVOT VTI:          0.206 m LVOT/AV VTI ratio: 1.06  AORTA Ao Root diam: 3.80 cm MITRAL VALVE                TRICUSPID VALVE MV Area (PHT): 4.41 cm     TR Peak grad:   6.8 mmHg MV Area VTI:   2.78 cm     TR Vmax:        130.00 cm/s MV Peak grad:  6.2 mmHg MV Mean grad:  3.0 mmHg     SHUNTS MV Vmax:       1.24 m/s     Systemic VTI:  0.21 m MV Vmean:      82.0 cm/s    Systemic Diam: 2.00 cm MV Decel Time: 172 msec MV E velocity: 111.00 cm/s MV A velocity: 79.10 cm/s MV E/A ratio:  1.40 Alwyn Pea MD Electronically signed by Alwyn Pea MD Signature Date/Time: 10/23/2023/1:43:44 PM    Final    DG Chest 2 View Result Date: 10/22/2023 CLINICAL DATA:  Chest pain EXAM: CHEST - 2 VIEW COMPARISON:  X-ray 07/31/2023. FINDINGS: Enlarged cardiopericardial silhouette with vascular congestion. Mild edema. Small bilateral pleural effusions. No pneumothorax. No consolidation. Degenerative changes seen along the spine. IMPRESSION: Postop chest with enlarged heart and vascular congestion. Mild edema suggested small effusions. Electronically Signed   By: Karen Kays M.D.   On: 10/22/2023 18:45    ECHO as above  TELEMETRY reviewed by me 10/24/2023: Sinus rhythm rate 90s  EKG reviewed by me: NSR rate 89 bpm, nonspecific ST-T changes  Data reviewed by me 10/24/2023: last 24h vitals tele labs imaging I/O ED provider note, admission  H&P  Principal Problem:   Acute on chronic diastolic CHF (congestive heart failure) (HCC) Active Problems:   Essential hypertension   Asthma, chronic   Type 2 diabetes mellitus without complication, with long-term current use of insulin (HCC)   Hypothyroidism   Dyslipidemia    ASSESSMENT AND PLAN:  DAXSTON ROBTOY is a 71 y.o. male  with a past medical history of coronary artery disease s/p CABGx4 in 2007, hypertension, hyperlipidemia, type II diabetes, hx CVA, hypothyroidism, asthma, chronic kidney disease stage IIIa, malignant gastrointestinal stromal tumor who presented to the ED on 10/23/2023 for leg swelling, shortness of breath. Cardiology was consulted for further evaluation.   # Acute on chronic HFpEF # Hypertension Patient with worsening LE edema and SOB x 1 week.  BNP elevated at 779.  Chest x-ray with pulmonary vascular congestion.  Echo this admission with EF 55-60%, no WMAs, LV moderately dilated, moderate to severe mitral regurg, mild to moderate tricuspid regurg. -Continue IV Lasix 80 mg twice daily.  Patient takes p.o. Lasix 80 mg once daily at home. -Start farxiga 10 mg daily tomorrow. -Continue metoprolol succinate 25 mg twice daily. Consider further additions to GDMT pending BP and renal function.  # Coronary artery disease s/p CABG x4 2007 # Hyperlipidemia Endorses twinges in chest  which he has had for many years, denies any exertional chest pain.  Opponens mildly elevated and flat at 28 > 37. -Continue home rosuvastatin 40 mg daily and aspirin 81 mg daily. -Mildly elevated troponin most consistent with demand/supply mismatch and not ACS in the setting of acute heart failure. -No plan for further cardiac diagnostics at this time.   This patient's plan of care was discussed and created with Dr. Juliann Pares and he is in agreement.  Signed: Gale Journey, PA-C  10/24/2023, 11:11 AM [facility]

## 2023-10-25 DIAGNOSIS — I5033 Acute on chronic diastolic (congestive) heart failure: Secondary | ICD-10-CM | POA: Diagnosis not present

## 2023-10-25 LAB — TYPE AND SCREEN
ABO/RH(D): A POS
Antibody Screen: NEGATIVE
Unit division: 0
Unit division: 0

## 2023-10-25 LAB — BASIC METABOLIC PANEL
Anion gap: 9 (ref 5–15)
BUN: 23 mg/dL (ref 8–23)
CO2: 30 mmol/L (ref 22–32)
Calcium: 8.7 mg/dL — ABNORMAL LOW (ref 8.9–10.3)
Chloride: 96 mmol/L — ABNORMAL LOW (ref 98–111)
Creatinine, Ser: 1.93 mg/dL — ABNORMAL HIGH (ref 0.61–1.24)
GFR, Estimated: 36 mL/min — ABNORMAL LOW (ref >60)
Glucose, Bld: 108 mg/dL — ABNORMAL HIGH (ref 70–99)
Potassium: 3.4 mmol/L — ABNORMAL LOW (ref 3.5–5.1)
Sodium: 135 mmol/L (ref 135–145)

## 2023-10-25 LAB — CBC
HCT: 26.7 % — ABNORMAL LOW (ref 39.0–52.0)
Hemoglobin: 8.9 g/dL — ABNORMAL LOW (ref 13.0–17.0)
MCH: 31.1 pg (ref 26.0–34.0)
MCHC: 33.3 g/dL (ref 30.0–36.0)
MCV: 93.4 fL (ref 80.0–100.0)
Platelets: 334 10*3/uL (ref 150–400)
RBC: 2.86 MIL/uL — ABNORMAL LOW (ref 4.22–5.81)
RDW: 19.5 % — ABNORMAL HIGH (ref 11.5–15.5)
WBC: 16.2 10*3/uL — ABNORMAL HIGH (ref 4.0–10.5)
nRBC: 0.1 % (ref 0.0–0.2)

## 2023-10-25 LAB — GLUCOSE, CAPILLARY
Glucose-Capillary: 85 mg/dL (ref 70–99)
Glucose-Capillary: 108 mg/dL — ABNORMAL HIGH (ref 70–99)
Glucose-Capillary: 184 mg/dL — ABNORMAL HIGH (ref 70–99)

## 2023-10-25 LAB — BPAM RBC
Blood Product Expiration Date: 202412202359
Blood Product Expiration Date: 202501052359
ISSUE DATE / TIME: 202412121058
ISSUE DATE / TIME: 202412130959
Unit Type and Rh: 6200
Unit Type and Rh: 6200

## 2023-10-25 LAB — PHOSPHORUS: Phosphorus: 4.2 mg/dL (ref 2.5–4.6)

## 2023-10-25 LAB — MAGNESIUM: Magnesium: 1.8 mg/dL (ref 1.7–2.4)

## 2023-10-25 MED ORDER — POTASSIUM CHLORIDE CRYS ER 20 MEQ PO TBCR
40.0000 meq | EXTENDED_RELEASE_TABLET | Freq: Once | ORAL | Status: AC
Start: 2023-10-25 — End: 2023-10-25
  Administered 2023-10-25: 40 meq via ORAL
  Filled 2023-10-25: qty 2

## 2023-10-25 NOTE — TOC Progression Note (Addendum)
Transition of Care [facility]) - Progression Note    Patient Details  Name: Roberto Ingram MRN: [id number] Date of Birth: 1951-10-28  Transition of Care [facility]) CM/SW Contact  Bing Quarry, RN Phone Number: 10/25/2023, 10:53 AM  Clinical Narrative: 12/14: PT notified RN CM that they are recommending Hoyer type lift for home care at DC. Patient has Medicare A/B. Contacted [employer], if approved it would be $100 per month to rent or 80% coverage for purchase at $995.00. PT also gave family number of [facility]. Updated provider and requested order to start process if family agreeable. RN CM with reach out to family as to how they wish to proceed.   Spoke with spouse and she indicates patient fell at home and they were unable to help him up and want a lift that patient can be transferred with but that can also rest a bit in after chemo. Patient is 6 feet tall.   Informed spouse of cost/patient responsibility of 20% and or rental at approx. $100/month per [employer]. Spouse chooses to be able to own the lift. Requested order from provider after PT note with recommendation is completed. Will update [employer] and deliver to home the soonest would be Monday but will need to know when discharge plan is more confirmed. Spouse wants lift at home prior to discharge for safety reasons.   PT gave more description and gave a photo about what spouse wants/patient needs and it is more of a power recliner that can be reclined fully for sleeping but also had the hydraulics to push the seat up to assist patient to stand/it. [employer] does not provide this type lift any longer but gave number for [employer] and [employer] at [phone]. FAX: [phone]. Will try to contact them over the weekend.   Production manager and [employer] at [phone]. FAX: [phone]  Update: Contacted the above company at [phone] from their website but they said they do not even file for insurance as they will not pay as it is considered  furniture. Cost $1500-$2000 USD from this company.  CMS website says they will pay 80% of the lift mechanism part of the Lift Recliner if patient meets requirements in the form below.  [email or web address]  Gabriel Cirri MSN RN CM  Care Management Department.  [place]  [facility] Direct Dial: [phone] Main Office Phone: [phone] Weekends Only           Expected Discharge Plan and Services                                               Social Determinants of Health (SDOH) Interventions SDOH Screenings   Food Insecurity: No Food Insecurity (10/24/2023)  Recent Concern: Food Insecurity - Food Insecurity Present (09/16/2023)   Received from [facility]  Housing: Low Risk  (10/24/2023)  Transportation Needs: No Transportation Needs (10/24/2023)  Recent Concern: Transportation Needs - Unmet Transportation Needs (08/31/2023)   Received from [facility]  Utilities: Not At Risk (10/24/2023)  Financial Resource Strain: Low Risk  (09/16/2023)   Received from [facility]  Physical Activity: Insufficiently Active (03/04/2018)  Social Connections: Socially Integrated (03/04/2018)  Stress: No Stress Concern Present (03/04/2018)  Tobacco Use: Medium Risk (10/23/2023)  Health Literacy: Adequate Health Literacy (09/29/2023)   Received from [facility]    Readmission Risk Interventions  No data to display

## 2023-10-25 NOTE — Progress Notes (Addendum)
Progress Note    Roberto Ingram  WGN:[id number] DOB: 07/15/1951  DOA: 10/23/2023 PCP: Erasmo Downer, NP      Brief Narrative:    Medical records reviewed and are as summarized below:  Roberto Ingram is a 72 y.o. male with PMH of CAD status post CABGx7 in 2007, recently diagnosed anaplastic large cell lymphoma, GIST tumor, CKD stage IV, HTN, gout, IDDM, COPD, BPH, chronic pain syndrome and narcotic dependence, who presented to the hospital with chest pain, shortness of breath, orthopnea and swelling in the lower extremities.     Assessment/Plan:   Principal Problem:   Acute on chronic diastolic CHF (congestive heart failure) (HCC) Active Problems:   Essential hypertension   Asthma, chronic   Type 2 diabetes mellitus without complication, with long-term current use of insulin (HCC)   Hypothyroidism   Dyslipidemia    Body mass index is 29.54 kg/m.   Acute on chronic diastolic CHF: Continue IV Lasix.  Monitor BMP, daily weight and urine output. 2D echo on 10/23/2023 showed LVEF 55 to 60%, moderate to severe MR and mild to moderate TR    Symptomatic anemia: Etiology unclear.  Hemoglobin improved from 6.6-8.9.  S/p transfusion with  units of PRBCs.  Monitor H&H.   CKD stage IIIb: Creatinine is stable.   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   CAD s/p CABG in 2007: Continue aspirin and statin   Hypokalemia: Replete potassium and monitor levels Hypomagnesemia: Improved   Type II DM: Continue Semglee and NovoLog   Comorbidities include chronic hypoxemic respiratory failure on nocturnal oxygen at home, asthma, dyslipidemia, hypertension, hypothyroidism     Diet Order             Diet Carb Modified Fluid consistency: Thin; Room service appropriate? Yes; Fluid restriction: 1500 mL Fluid  Diet effective now                             Consultants: Cardiologist  Procedures: None    Medications:    allopurinol  100 mg Oral Daily   apixaban  5 mg Oral BID   ascorbic acid  500 mg Oral Daily   aspirin  81 mg Oral Daily   dapagliflozin propanediol  10 mg Oral Daily   furosemide  80 mg Intravenous BID   insulin aspart  0-5 Units Subcutaneous QHS   insulin aspart  0-9 Units Subcutaneous TID WC   insulin glargine-yfgn  14 Units Subcutaneous QHS   iron polysaccharides  150 mg Oral Daily   levothyroxine  50 mcg Oral Q0600   metoprolol succinate  25 mg Oral BID   multivitamin with minerals  1 tablet Oral Daily   omega-3 acid ethyl esters  1 g Oral Daily   pantoprazole  40 mg Oral Daily   polyethylene glycol  17 g Oral BID   potassium chloride  40 mEq Oral Once   rosuvastatin  40 mg Oral Daily   Continuous Infusions:   Anti-infectives (From admission, onward)    None              Family Communication/Anticipated D/C date and plan/Code Status   DVT prophylaxis: Place and maintain sequential compression device Start: 10/23/23 1623 apixaban (ELIQUIS) tablet 5 mg     Code Status: Full Code  Family Communication: Plan discussed with his wife at the bedside Disposition Plan: Plan  to discharge home   Status  is: Inpatient Remains inpatient appropriate because: CHF exacerbation       Subjective:   Interval events noted.  No chest pain or shortness of breath.  He still has swelling in the legs.  His wife was at the bedside.  Objective:    Vitals:   10/25/23 0500 10/25/23 0740 10/25/23 1213 10/25/23 1451  BP:  129/71 (!) 130/54   Pulse:  77 78 (!) 101  Resp:  16 16   Temp:  97.7 F (36.5 C) 97.9 F (36.6 C)   TempSrc:      SpO2:  94% 97% 91%  Weight: 98.8 kg     Height:       No data found.   Intake/Output Summary (Last 24 hours) at 10/25/2023 1530 Last data filed at 10/25/2023 0352 Gross per 24 hour  Intake --  Output 1875 ml  Net -1875 ml   Filed  Weights   10/22/23 1659 10/25/23 0500  Weight: 98.9 kg 98.8 kg    Exam:  GEN: NAD SKIN: Warm and dry EYES: No pallor or icterus ENT: MMM CV: RRR PULM: CTA B ABD: soft, ND, NT, +BS CNS: AAO x 3, non focal EXT: B/l leg edema, no tenderness        Data Reviewed:   I have personally reviewed following labs and imaging studies:  Labs: Labs show the following:   Basic Metabolic Panel: Recent Labs  Lab 10/22/23 1707 10/23/23 0500 10/24/23 0547 10/25/23 0331  NA 138 139 139 135  K 3.9 3.5 3.6 3.4*  CL 101 104 103 96*  CO2 25 28 30 30   GLUCOSE 202* 119* 121* 108*  BUN 27* 25* 24* 23  CREATININE 2.21* 2.00* 2.00* 1.93*  CALCIUM 8.2* 8.2* 8.7* 8.7*  MG  --  1.5* 1.8 1.8  PHOS  --  3.2 3.5 4.2   GFR Estimated Creatinine Clearance: 41.5 mL/min (A) (by C-G formula based on SCr of 1.93 mg/dL (H)). Liver Function Tests: No results for input(s): "AST", "ALT", "ALKPHOS", "BILITOT", "PROT", "ALBUMIN" in the last 168 hours. No results for input(s): "LIPASE", "AMYLASE" in the last 168 hours. No results for input(s): "AMMONIA" in the last 168 hours. Coagulation profile No results for input(s): "INR", "PROTIME" in the last 168 hours.  CBC: Recent Labs  Lab 10/22/23 1707 10/23/23 0500 10/24/23 0547 10/25/23 0331  WBC 20.1* 15.1* 15.6* 16.2*  HGB 7.3* 6.6* 7.2* 8.9*  HCT 23.0* 20.5* 22.3* 26.7*  MCV 98.7 98.1 93.7 93.4  PLT 264 266 309 334   Cardiac Enzymes: Recent Labs  Lab 10/23/23 0500  CKTOTAL 40*   BNP (last 3 results) No results for input(s): "PROBNP" in the last 8760 hours. CBG: Recent Labs  Lab 10/24/23 0750 10/24/23 1142 10/24/23 1817 10/25/23 0741 10/25/23 1215  GLUCAP 101* 113* 166* 85 108*   D-Dimer: No results for input(s): "DDIMER" in the last 72 hours. Hgb A1c: No results for input(s): "HGBA1C" in the last 72 hours. Lipid Profile: No results for input(s): "CHOL", "HDL", "LDLCALC", "TRIG", "CHOLHDL", "LDLDIRECT" in the last 72  hours. Thyroid function studies: No results for input(s): "TSH", "T4TOTAL", "T3FREE", "THYROIDAB" in the last 72 hours.  Invalid input(s): "FREET3" Anemia work up: Recent Labs    10/23/23 0500 10/23/23 0919  VITAMINB12  --  2,643*  FOLATE 14.1  --   TIBC 230*  --   IRON 32*  --   RETICCTPCT 4.8*  --    Sepsis Labs: Recent Labs  Lab 10/22/23 1707 10/23/23 0500 10/24/23  4098 10/25/23 0331  PROCALCITON  --  0.39  --   --   WBC 20.1* 15.1* 15.6* 16.2*    Microbiology No results found for this or any previous visit (from the past 240 hours).  Procedures and diagnostic studies:  No results found.              LOS: 2 days   Rakwon Letourneau  [facility]   Pager on [email or web address]. If 7PM-7AM, please contact night-coverage at [email or web address]     10/25/2023, 3:30 PM

## 2023-10-25 NOTE — Evaluation (Addendum)
Occupational Therapy Evaluation Patient Details Name: Roberto Ingram MRN: [id number] DOB: 06-01-1951 Today's Date: 10/25/2023   History of Present Illness Roberto Ingram is a 72 y.o. African-American male with medical history significant for multiple medical problems that are mentioned below, who presented to the emergency room with onset of midsternal chest pain mild in intensity with no radiation as well as dyspnea, orthopnea, and worsening lower extremity edema.   Clinical Impression   Patient agreeable to OT evaluation, spouse present. At baseline, pt receives assistance for ADLs/IADLs as needed from spouse. He presents to OT with decreased strength, endurance, and balance impacting performance in ADLs and functional mobility. During evaluation, he completed bed mobility with SBA, multiple STS with SBA using RW, and functional mobility at room level with SBA-CGA using RW. For ADLs, he is currently requiring set up A for seated UB ADLs, CGA for toileting tasks, and CGA for sinkside grooming. Pt received in bed and left in recliner with all needs in reach. Pt will benefit from skilled acute OT services to address deficits noted below. OT recommends ongoing therapy upon discharge to maximize safety and independence with ADLs, decrease fall risk, decrease caregiver burden, and promote return to PLOF.      If plan is discharge home, recommend the following: A little help with walking and/or transfers;A little help with bathing/dressing/bathroom;Assistance with cooking/housework;Assist for transportation;Help with stairs or ramp for entrance    Functional Status Assessment  Patient has had a recent decline in their functional status and demonstrates the ability to make significant improvements in function in a reasonable and predictable amount of time.  Equipment Recommendations  Other (comment) (lift recliner)    Recommendations for Other Services       Precautions / Restrictions  Precautions Precautions: Fall Restrictions Weight Bearing Restrictions Per Provider Order: No      Mobility Bed Mobility Overal bed mobility: Needs Assistance Bed Mobility: Supine to Sit     Supine to sit:  (SBA)          Transfers Overall transfer level: Needs assistance Equipment used: Rolling walker (2 wheels) Transfers: Sit to/from Stand Sit to Stand:  (SBA)           General transfer comment: 1x STS from EOB and multiple STS from recliner      Balance Overall balance assessment: Needs assistance Sitting-balance support: Feet supported Sitting balance-Leahy Scale: Good     Standing balance support: Single extremity supported, Bilateral upper extremity supported, During functional activity Standing balance-Leahy Scale: Fair       ADL either performed or assessed with clinical judgement   ADL Overall ADL's : Needs assistance/impaired Eating/Feeding: Set up;Sitting   Grooming: Standing;Wash/dry hands;Contact guard assist Grooming Details (indicate cue type and reason): Pt completed hand hygiene while standing at the sink after toileting.                 Toilet Transfer: Ambulation;Rolling walker (2 wheels);Contact guard assist Toilet Transfer Details (indicate cue type and reason): Pt stood in front of the toilet to urinate. Toileting- Clothing Manipulation and Hygiene: Contact guard assist;Sit to/from stand       Functional mobility during ADLs: Rolling walker (2 wheels) (Pt completed functional mobility to/from bathroom (2x54ft) and at room level (~67ft) with SBA-CGA for safety d/t pt reports of dizziness (improved with time).)       Vision Patient Visual Report: No change from baseline       Perception  Praxis         Pertinent Vitals/Pain Pain Assessment Pain Assessment: 0-10 Pain Score: 2  Pain Location: 2/10 at rest on lateral aspect of RLE, pt described as "nerve pain" Pain Descriptors / Indicators: Burning Pain  Intervention(s): Limited activity within patient's tolerance, Monitored during session, Repositioned     Extremity/Trunk Assessment Upper Extremity Assessment Upper Extremity Assessment: Generalized weakness   Lower Extremity Assessment Lower Extremity Assessment: Generalized weakness       Communication Communication Communication: No apparent difficulties   Cognition Arousal: Alert   Overall Cognitive Status: Within Functional Limits for tasks assessed           General Comments  Pt endorsed dizziness upon sitting EOB which happens at baseline, non-limiting and improved with time. SpO2 at rest 92-93% on RA, 95-97% after activity on RA.    Exercises Other Exercises Other Exercises: OT provided education re: role of OT, OT POC, post acute recs, sitting up for all meals, EOB/OOB mobility with assistance, home/fall safety.     Shoulder Instructions      Home Living Family/patient expects to be discharged to:: Private residence Living Arrangements: Spouse/significant other Available Help at Discharge: Family;Available 24 hours/day Type of Home: House Home Access: Stairs to enter;Ramped entrance (needs a new ramp) Entrance Stairs-Number of Steps: 7-8 steps from front with B railings; ramp in back Entrance Stairs-Rails: Right;Left Home Layout: One level     Bathroom Shower/Tub: Chief Strategy Officer: Handicapped height (uses BSC frame over toilet)     Home Equipment: Rolling Walker (2 wheels);BSC/3in1;Tub bench          Prior Functioning/Environment Prior Level of Function : Independent/Modified Independent;History of Falls (last six months)             Mobility Comments: Mod I-supervision using RW. Pt/spouse endorse difficulty with STS from lower surfaces while going through chemo. 1 fall in past 6 months where pt slid out of recliner - pt has multiple cushions placed in recliner to make it higher. ADLs Comments: Pt reports wife assisting with  ADLs as needed, including LBD (socks) and bathing (helps with washing pt's back and feet). Wife completes IADLs including driving. Uses BSC over toilet and tub transfer bench for bathing. Pt was getting HH PT/OT prior to admission.         OT Problem List: Decreased strength;Decreased activity tolerance;Impaired balance (sitting and/or standing);Decreased knowledge of use of DME or AE;Cardiopulmonary status limiting activity;Pain;Increased edema;Obesity      OT Treatment/Interventions: Self-care/ADL training;Therapeutic exercise;Energy conservation;DME and/or AE instruction;Therapeutic activities;Patient/family education;Balance training    OT Goals(Current goals can be found in the care plan section) Acute Rehab OT Goals Patient Stated Goal: return home OT Goal Formulation: With patient/family Time For Goal Achievement: 11/08/23 Potential to Achieve Goals: Good  OT Frequency: Min 1X/week    Co-evaluation PT/OT/SLP Co-Evaluation/Treatment: Yes Reason for Co-Treatment: For patient/therapist safety PT goals addressed during session: Mobility/safety with mobility;Balance;Proper use of DME OT goals addressed during session: ADL's and self-care;Proper use of Adaptive equipment and DME      AM-PAC OT "6 Clicks" Daily Activity     Outcome Measure Help from another person eating meals?: None Help from another person taking care of personal grooming?: A Little Help from another person toileting, which includes using toliet, bedpan, or urinal?: A Little Help from another person bathing (including washing, rinsing, drying)?: A Lot Help from another person to put on and taking off regular upper body clothing?: A Little Help from  another person to put on and taking off regular lower body clothing?: A Lot 6 Click Score: 17   End of Session Equipment Utilized During Treatment: Rolling walker (2 wheels) Nurse Communication: Mobility status  Activity Tolerance: Patient tolerated treatment  well;Patient limited by fatigue Patient left: in chair;with call bell/phone within reach;with chair alarm set;with family/visitor present  OT Visit Diagnosis: Unsteadiness on feet (R26.81);Other abnormalities of gait and mobility (R26.89);History of falling (Z91.81);Muscle weakness (generalized) (M62.81);Pain Pain - Right/Left: Right Pain - part of body: Leg                Time: 6578-4696 OT Time Calculation (min): 50 min Charges:  OT General Charges $OT Visit: 1 Visit OT Evaluation $OT Eval Low Complexity: 1 Low OT Treatments $Self Care/Home Management : 8-22 mins  [facility] MS, OTR/L ascom [phone]  10/25/23, 12:21 PM

## 2023-10-25 NOTE — Progress Notes (Signed)
Patient ID: Roberto Ingram, male   DOB: Mar 05, 1951, 72 y.o.   MRN: [id number] [facility]    SUBJECTIVE: Patient feels much better denies any chest pain still has some shortness of breath.  Patient states that he is feeling somewhat better less swelling less shortness of breath    Vitals:   10/25/23 0500 10/25/23 0740 10/25/23 1213 10/25/23 1451  BP:  129/71 (!) 130/54   Pulse:  77 78 (!) 101  Resp:  16 16   Temp:  97.7 F (36.5 C) 97.9 F (36.6 C)   TempSrc:      SpO2:  94% 97% 91%  Weight: 98.8 kg     Height:         Intake/Output Summary (Last 24 hours) at 10/25/2023 1551 Last data filed at 10/25/2023 4098 Gross per 24 hour  Intake --  Output 1875 ml  Net -1875 ml      PHYSICAL EXAM  General: Well developed, well nourished, in no acute distress HEENT:  Normocephalic and atramatic Neck:  No JVD.  Lungs: Clear bilaterally to auscultation and percussion. Heart: HRRR . Normal S1 and S2 without gallops or murmurs.  Abdomen: Bowel sounds are positive, abdomen soft and non-tender  Msk:  Back normal, normal gait. Normal strength and tone for age. Extremities: No clubbing, cyanosis or edema.   Neuro: Alert and oriented X 3. Psych:  Good affect, responds appropriately   LABS: Basic Metabolic Panel: Recent Labs    10/24/23 0547 10/25/23 0331  NA 139 135  K 3.6 3.4*  CL 103 96*  CO2 30 30  GLUCOSE 121* 108*  BUN 24* 23  CREATININE 2.00* 1.93*  CALCIUM 8.7* 8.7*  MG 1.8 1.8  PHOS 3.5 4.2   Liver Function Tests: No results for input(s): "AST", "ALT", "ALKPHOS", "BILITOT", "PROT", "ALBUMIN" in the last 72 hours. No results for input(s): "LIPASE", "AMYLASE" in the last 72 hours. CBC: Recent Labs    10/24/23 0547 10/25/23 0331  WBC 15.6* 16.2*  HGB 7.2* 8.9*  HCT 22.3* 26.7*  MCV 93.7 93.4  PLT 309 334   Cardiac Enzymes: Recent Labs    10/23/23 0500  CKTOTAL 40*   BNP: Invalid input(s): "POCBNP" D-Dimer: No results for input(s): "DDIMER" in the  last 72 hours. Hemoglobin A1C: No results for input(s): "HGBA1C" in the last 72 hours. Fasting Lipid Panel: No results for input(s): "CHOL", "HDL", "LDLCALC", "TRIG", "CHOLHDL", "LDLDIRECT" in the last 72 hours. Thyroid Function Tests: No results for input(s): "TSH", "T4TOTAL", "T3FREE", "THYROIDAB" in the last 72 hours.  Invalid input(s): "FREET3" Anemia Panel: Recent Labs    10/23/23 0500 10/23/23 0919  VITAMINB12  --  2,643*  FOLATE 14.1  --   TIBC 230*  --   IRON 32*  --   RETICCTPCT 4.8*  --     No results found.   Echo   TELEMETRY: Normal sinus rhythm rate of around 70 :  ASSESSMENT AND PLAN:  Principal Problem:   Acute on chronic diastolic CHF (congestive heart failure) (HCC) Active Problems:   Essential hypertension   Asthma, chronic   Type 2 diabetes mellitus without complication, with long-term current use of insulin (HCC)   Hypothyroidism   Dyslipidemia    Plan Acute on chronic diastolic dysfunction continue current therapy diuretics Hypertension continue blood pressure management to control Asthma agree with inhalers as necessary Diabetes type 2 uncomplicated continue current therapy follow-up with primary physician Hyperlipidemia continue Lipitor therapy for lipid management Obesity recommend weight loss exercise  portion control Coronary bypass surgery times 03/2006 no recent chest pain Continue conservative management Aplastic large cell lymphoma continue to follow-up with hematology Chronic pain syndrome follow-up with pain clinic COPD/dyspnea shortness of breath continue inhalers consider follow-up with pulmonary Chronic renal sufficiency stage IIIb follow-up with nephrology History of brachial vein thrombosis currently on Eliquis for anticoagulation Correct electrolytes Continue current management    Alwyn Pea, MD 10/25/2023 3:51 PM

## 2023-10-25 NOTE — Evaluation (Signed)
Physical Therapy Evaluation Co-eval with OT Patient Details Name: Roberto Ingram MRN: [id number] DOB: [birth year] Today's Date: 10/25/2023  History of Present Illness  Roberto Ingram is a 72 y.o. African-American male with medical history significant for multiple medical problems that are mentioned below, who presented to the emergency room with onset of midsternal chest pain mild in intensity with no radiation as well as dyspnea, orthopnea, and worsening lower extremity edema.  Clinical Impression  72 yo Male admitted with worsening LE edema and congestive heart failure. Patient recently started chemo after being diagnosed with lymphoma. He reports 1 fall in last month, falling out of chair at home. Wife reports after chemo treatment he was so weak he was unable to stand without physical assistance. Patient is >6 feet tall and is at a height disadvantage from regular height chairs. They have tried to modify sitting chairs at home adding pillows but that has become a safety issue as the cushions cause him to slide out of chair. They are requesting a lift chair to provide necessary support for transfers to help improve safety in the home. Patient currently requires stand by assist for bed mobility and transfers. He was able to complete 5 times sit<>Stand from recliner in 23 sec indicating high risk for falls. He would benefit from skilled PT intervention to improve strength and mobility.         If plan is discharge home, recommend the following: A little help with walking and/or transfers;A little help with bathing/dressing/bathroom;Assistance with cooking/housework;Assist for transportation;Help with stairs or ramp for entrance   Can travel by private vehicle        Equipment Recommendations Other (comment) (requesting lift chair for safety as unable to transfer after chemo treatment due to severe fatigue/weakness)  Recommendations for Other Services       Functional Status Assessment Patient  has had a recent decline in their functional status and demonstrates the ability to make significant improvements in function in a reasonable and predictable amount of time.     Precautions / Restrictions Precautions Precautions: Fall Precaution Comments: has experienced recent fall out of chair at home after last chemo treatment and unable to get up without physical assistance Restrictions Weight Bearing Restrictions Per Provider Order: No      Mobility  Bed Mobility Overal bed mobility: Needs Assistance Bed Mobility: Supine to Sit     Supine to sit:  (SBA)     General bed mobility comments: Stand by assist for equipment management (cardiac monitor)    Transfers Overall transfer level: Needs assistance Equipment used: Rolling walker (2 wheels), None Transfers: Sit to/from Stand Sit to Stand:  (SBA)           General transfer comment: 1x STS from EOB and multiple STS from recliner; completed 5 times sit<>stand from recliner without AD 23 sec indicating high risk for falls; while patient able to complete sit to stand from recliner with stand by assist, it has been 18 days since last chemo treatment; Wife reports after chemo treatment he is so fatigued he can't get up without physical assist and actually experienced a fall after not being able to transfer from chair.    Ambulation/Gait Ambulation/Gait assistance: Contact guard assist Gait Distance (Feet): 10 Feet Assistive device: Rolling walker (2 wheels)   Gait velocity: decreased     General Gait Details: pt ambulated from bed to bathroom with CGA using RW, step through pattern demonstrating good safety awareness and positioning  Stairs  Wheelchair Mobility     Tilt Bed    Modified Rankin (Stroke Patients Only)       Balance Overall balance assessment: Needs assistance Sitting-balance support: Feet supported Sitting balance-Leahy Scale: Good     Standing balance support: Single extremity  supported, Bilateral upper extremity supported, During functional activity Standing balance-Leahy Scale: Fair                               Pertinent Vitals/Pain Pain Assessment Pain Assessment: 0-10 Pain Score: 2  Pain Location: RLE, described as nerve pain Pain Descriptors / Indicators: Burning Pain Intervention(s): Limited activity within patient's tolerance, Monitored during session, Repositioned    Home Living Family/patient expects to be discharged to:: Private residence Living Arrangements: Spouse/significant other Available Help at Discharge: Family;Available 24 hours/day Type of Home: House Home Access: Stairs to enter;Ramped entrance (needs a new ramp) Entrance Stairs-Rails: Right;Left Entrance Stairs-Number of Steps: 7-8 steps from front with B railings; ramp in back   Home Layout: One level Home Equipment: Rolling Walker (2 wheels);BSC/3in1;Tub bench      Prior Function Prior Level of Function : Independent/Modified Independent;History of Falls (last six months)             Mobility Comments: Mod I-supervision using RW. Pt/spouse endorse difficulty with STS from lower surfaces while going through chemo. 1 fall in past 6 months where pt slid out of recliner - pt has multiple cushions placed in recliner to make it higher. ADLs Comments: Pt reports wife assisting with ADLs as needed, including LBD (socks) and bathing (helps with washing pt's back and feet). Wife completes IADLs including driving. Uses BSC over toilet and tub transfer bench for bathing. Pt was getting HH PT/OT prior to admission.     Extremity/Trunk Assessment   Upper Extremity Assessment Upper Extremity Assessment: Generalized weakness    Lower Extremity Assessment Lower Extremity Assessment: Generalized weakness       Communication   Communication Communication: No apparent difficulties Cueing Techniques: Verbal cues  Cognition Arousal: Alert Behavior During Therapy: WFL for  tasks assessed/performed Overall Cognitive Status: Within Functional Limits for tasks assessed                                          General Comments General comments (skin integrity, edema, etc.): Pt dizzy upon sitting edge of bed requiring short rest break; Spo2 92-93% at rest on room air, increased to 95% during activity    Exercises     Assessment/Plan    PT Assessment Patient needs continued PT services  PT Problem List Decreased strength;Cardiopulmonary status limiting activity;Decreased activity tolerance;Decreased balance;Decreased safety awareness;Decreased mobility       PT Treatment Interventions DME instruction;Balance training;Gait training;Stair training;Functional mobility training;Patient/family education;Therapeutic activities;Therapeutic exercise    PT Goals (Current goals can be found in the Care Plan section)  Acute Rehab PT Goals Patient Stated Goal: to get stronger and go home PT Goal Formulation: With patient Time For Goal Achievement: 11/08/23 Potential to Achieve Goals: Good    Frequency Min 1X/week     Co-evaluation PT/OT/SLP Co-Evaluation/Treatment: Yes Reason for Co-Treatment: For patient/therapist safety PT goals addressed during session: Mobility/safety with mobility;Balance;Proper use of DME OT goals addressed during session: ADL's and self-care;Proper use of Adaptive equipment and DME       AM-PAC PT "6 Clicks" Mobility  Outcome Measure Help needed turning from your back to your side while in a flat bed without using bedrails?: None Help needed moving from lying on your back to sitting on the side of a flat bed without using bedrails?: None Help needed moving to and from a bed to a chair (including a wheelchair)?: A Little Help needed standing up from a chair using your arms (e.g., wheelchair or bedside chair)?: A Little Help needed to walk in hospital room?: A Little Help needed climbing 3-5 steps with a railing? : A  Little 6 Click Score: 20    End of Session Equipment Utilized During Treatment: Gait belt Activity Tolerance: Patient tolerated treatment well Patient left: in chair;with call bell/phone within reach;with chair alarm set;with family/visitor present Nurse Communication: Mobility status;Other (comment) (requesting lift chair for home for safety with transfers) PT Visit Diagnosis: Unsteadiness on feet (R26.81);Muscle weakness (generalized) (M62.81)    Time: 4098-1191 PT Time Calculation (min) (ACUTE ONLY): 45 min   Charges:   PT Evaluation $PT Eval Low Complexity: 1 Low   PT General Charges $$ ACUTE PT VISIT: 1 Visit          Maggie Dworkin PT, DPT 10/25/2023, 2:58 PM

## 2023-10-26 DIAGNOSIS — I5033 Acute on chronic diastolic (congestive) heart failure: Secondary | ICD-10-CM | POA: Diagnosis not present

## 2023-10-26 LAB — CBC
HCT: 27.5 % — ABNORMAL LOW (ref 39.0–52.0)
Hemoglobin: 9.2 g/dL — ABNORMAL LOW (ref 13.0–17.0)
MCH: 31.6 pg (ref 26.0–34.0)
MCHC: 33.5 g/dL (ref 30.0–36.0)
MCV: 94.5 fL (ref 80.0–100.0)
Platelets: 338 10*3/uL (ref 150–400)
RBC: 2.91 MIL/uL — ABNORMAL LOW (ref 4.22–5.81)
RDW: 18.9 % — ABNORMAL HIGH (ref 11.5–15.5)
WBC: 12.9 10*3/uL — ABNORMAL HIGH (ref 4.0–10.5)
nRBC: 0.2 % (ref 0.0–0.2)

## 2023-10-26 LAB — BASIC METABOLIC PANEL
Anion gap: 13 (ref 5–15)
BUN: 27 mg/dL — ABNORMAL HIGH (ref 8–23)
CO2: 30 mmol/L (ref 22–32)
Calcium: 9 mg/dL (ref 8.9–10.3)
Chloride: 97 mmol/L — ABNORMAL LOW (ref 98–111)
Creatinine, Ser: 2.1 mg/dL — ABNORMAL HIGH (ref 0.61–1.24)
GFR, Estimated: 33 mL/min — ABNORMAL LOW (ref >60)
Glucose, Bld: 184 mg/dL — ABNORMAL HIGH (ref 70–99)
Potassium: 3.9 mmol/L (ref 3.5–5.1)
Sodium: 140 mmol/L (ref 135–145)

## 2023-10-26 LAB — GLUCOSE, CAPILLARY
Glucose-Capillary: 112 mg/dL — ABNORMAL HIGH (ref 70–99)
Glucose-Capillary: 148 mg/dL — ABNORMAL HIGH (ref 70–99)
Glucose-Capillary: 226 mg/dL — ABNORMAL HIGH (ref 70–99)
Glucose-Capillary: 213 mg/dL — ABNORMAL HIGH (ref 70–99)

## 2023-10-26 LAB — MAGNESIUM: Magnesium: 1.7 mg/dL (ref 1.7–2.4)

## 2023-10-26 LAB — PHOSPHORUS: Phosphorus: 4.5 mg/dL (ref 2.5–4.6)

## 2023-10-26 MED ORDER — ORAL CARE MOUTH RINSE: 15.0000 mL | OROMUCOSAL | Status: DC | PRN

## 2023-10-26 MED ORDER — BUMETANIDE 1 MG PO TABS
1.0000 mg | ORAL_TABLET | Freq: Every day | ORAL | Status: DC
  Administered 2023-10-26: 1 mg via ORAL
  Filled 2023-10-26 (×2): qty 1

## 2023-10-26 NOTE — Plan of Care (Signed)

## 2023-10-26 NOTE — Progress Notes (Signed)
Progress Note    Roberto Ingram  ZOX:[id number] DOB: 1951-06-02  DOA: 10/23/2023 PCP: Erasmo Downer, NP      Brief Narrative:    Medical records reviewed and are as summarized below:  Roberto Ingram is a 72 y.o. male with PMH of CAD status post CABGx7 in 2007, recently diagnosed anaplastic large cell lymphoma, GIST tumor, CKD stage IV, HTN, gout, IDDM, COPD, BPH, chronic pain syndrome and narcotic dependence, who presented to the hospital with chest pain, shortness of breath, orthopnea and swelling in the lower extremities.     Assessment/Plan:   Principal Problem:   Acute on chronic diastolic CHF (congestive heart failure) (HCC) Active Problems:   Essential hypertension   Asthma, chronic   Type 2 diabetes mellitus without complication, with long-term current use of insulin (HCC)   Hypothyroidism   Dyslipidemia    Body mass index is 29.54 kg/m.   Acute on chronic diastolic CHF, valvular heart disease: Continue IV Lasix.  Add Bumex 0.5 mg x 1 dose to enhance diuresis.  Monitor BMP, daily weight and urine output. 2D echo on 10/23/2023 showed LVEF 55 to 60%, moderate to severe mitral regurgitation and mild to moderate tricuspid regurgitation   Symptomatic anemia: Etiology unclear.  Hemoglobin improved from 6.6-8.9.  S/p transfusion with  units of PRBCs.  Monitor H&H.   CKD stage IIIb: Creatinine is stable.   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   CAD s/p CABG in 2007: Continue aspirin and statin   Hypokalemia: Improved Hypomagnesemia: Improved   Type II DM: Continue Semglee and NovoLog   Comorbidities include chronic hypoxemic respiratory failure on nocturnal oxygen at home, asthma, dyslipidemia, hypertension, hypothyroidism     Diet Order             Diet Carb Modified Fluid consistency: Thin; Room service appropriate? Yes; Fluid restriction: 1500 mL Fluid  Diet effective now                             Consultants: Cardiologist  Procedures: None    Medications:    allopurinol  100 mg Oral Daily   apixaban  5 mg Oral BID   ascorbic acid  500 mg Oral Daily   aspirin  81 mg Oral Daily   bumetanide  1 mg Oral Daily   dapagliflozin propanediol  10 mg Oral Daily   furosemide  80 mg Intravenous BID   insulin aspart  0-5 Units Subcutaneous QHS   insulin aspart  0-9 Units Subcutaneous TID WC   insulin glargine-yfgn  14 Units Subcutaneous QHS   iron polysaccharides  150 mg Oral Daily   levothyroxine  50 mcg Oral Q0600   metoprolol succinate  25 mg Oral BID   multivitamin with minerals  1 tablet Oral Daily   omega-3 acid ethyl esters  1 g Oral Daily   pantoprazole  40 mg Oral Daily   polyethylene glycol  17 g Oral BID   rosuvastatin  40 mg Oral Daily   Continuous Infusions:   Anti-infectives (From admission, onward)    None              Family Communication/Anticipated D/C date and plan/Code Status   DVT prophylaxis: Place and maintain sequential compression device Start: 10/23/23 1623 apixaban (ELIQUIS) tablet 5 mg     Code Status: Full Code  Family Communication: Plan discussed with his wife at the bedside  Disposition Plan: Plan  to discharge home   Status is: Inpatient Remains inpatient appropriate because: CHF exacerbation       Subjective:   Interval events noted.  He complains of shortness of breath with exertion.  He still has significant swelling in the legs.  His wife is at the bedside.  Objective:    Vitals:   10/25/23 1916 10/26/23 0103 10/26/23 0411 10/26/23 0831  BP: 115/62 116/64 (!) 117/57 124/62  Pulse: 79 80 71 73  Resp: 18 18 18    Temp: 97.7 F (36.5 C) 97.8 F (36.6 C) 98 F (36.7 C) (!) 97.4 F (36.3 C)  TempSrc:      SpO2: 97% 100% 100% 100%  Weight:      Height:       No data found.   Intake/Output Summary (Last 24 hours) at 10/26/2023 1030 Last data filed at 10/26/2023 0400 Gross per 24  hour  Intake 480 ml  Output 200 ml  Net 280 ml   Filed Weights   10/22/23 1659 10/25/23 0500  Weight: 98.9 kg 98.8 kg    Exam:   GEN: NAD SKIN: Warm and dry EYES: No pallor or icterus ENT: MMM CV: RRR, pansystolic murmur loudest in the mitral area PULM: Bibasilar rales, no wheezing ABD: soft, ND, NT, +BS CNS: AAO x 3, non focal EXT: B/l leg edema (L>R), no  tenderness     Data Reviewed:   I have personally reviewed following labs and imaging studies:  Labs: Labs show the following:   Basic Metabolic Panel: Recent Labs  Lab 10/22/23 1707 10/23/23 0500 10/24/23 0547 10/25/23 0331 10/26/23 0509  NA 138 139 139 135 140  K 3.9 3.5 3.6 3.4* 3.9  CL 101 104 103 96* 97*  CO2 25 28 30 30 30   GLUCOSE 202* 119* 121* 108* 184*  BUN 27* 25* 24* 23 27*  CREATININE 2.21* 2.00* 2.00* 1.93* 2.10*  CALCIUM 8.2* 8.2* 8.7* 8.7* 9.0  MG  --  1.5* 1.8 1.8 1.7  PHOS  --  3.2 3.5 4.2 4.5   GFR Estimated Creatinine Clearance: 38.2 mL/min (A) (by C-G formula based on SCr of 2.1 mg/dL (H)). Liver Function Tests: No results for input(s): "AST", "ALT", "ALKPHOS", "BILITOT", "PROT", "ALBUMIN" in the last 168 hours. No results for input(s): "LIPASE", "AMYLASE" in the last 168 hours. No results for input(s): "AMMONIA" in the last 168 hours. Coagulation profile No results for input(s): "INR", "PROTIME" in the last 168 hours.  CBC: Recent Labs  Lab 10/22/23 1707 10/23/23 0500 10/24/23 0547 10/25/23 0331 10/26/23 0509  WBC 20.1* 15.1* 15.6* 16.2* 12.9*  HGB 7.3* 6.6* 7.2* 8.9* 9.2*  HCT 23.0* 20.5* 22.3* 26.7* 27.5*  MCV 98.7 98.1 93.7 93.4 94.5  PLT 264 266 309 334 338   Cardiac Enzymes: Recent Labs  Lab 10/23/23 0500  CKTOTAL 40*   BNP (last 3 results) No results for input(s): "PROBNP" in the last 8760 hours. CBG: Recent Labs  Lab 10/24/23 1817 10/25/23 0741 10/25/23 1215 10/25/23 1606 10/26/23 0833  GLUCAP 166* 85 108* 184* 112*   D-Dimer: No results for  input(s): "DDIMER" in the last 72 hours. Hgb A1c: No results for input(s): "HGBA1C" in the last 72 hours. Lipid Profile: No results for input(s): "CHOL", "HDL", "LDLCALC", "TRIG", "CHOLHDL", "LDLDIRECT" in the last 72 hours. Thyroid function studies: No results for input(s): "TSH", "T4TOTAL", "T3FREE", "THYROIDAB" in the last 72 hours.  Invalid input(s): "FREET3" Anemia work up: No results for input(s): "VITAMINB12", "FOLATE", "  FERRITIN", "TIBC", "IRON", "RETICCTPCT" in the last 72 hours.  Sepsis Labs: Recent Labs  Lab 10/23/23 0500 10/24/23 0547 10/25/23 0331 10/26/23 0509  PROCALCITON 0.39  --   --   --   WBC 15.1* 15.6* 16.2* 12.9*    Microbiology No results found for this or any previous visit (from the past 240 hours).  Procedures and diagnostic studies:  No results found.              LOS: 3 days   Yukie Bergeron  [facility]   Pager on [email or web address]. If 7PM-7AM, please contact night-coverage at [email or web address]     10/26/2023, 10:30 AM

## 2023-10-27 DIAGNOSIS — I5033 Acute on chronic diastolic (congestive) heart failure: Secondary | ICD-10-CM | POA: Diagnosis not present

## 2023-10-27 LAB — BASIC METABOLIC PANEL
Anion gap: 12 (ref 5–15)
BUN: 31 mg/dL — ABNORMAL HIGH (ref 8–23)
CO2: 33 mmol/L — ABNORMAL HIGH (ref 22–32)
Calcium: 8.7 mg/dL — ABNORMAL LOW (ref 8.9–10.3)
Chloride: 95 mmol/L — ABNORMAL LOW (ref 98–111)
Creatinine, Ser: 2.21 mg/dL — ABNORMAL HIGH (ref 0.61–1.24)
GFR, Estimated: 31 mL/min — ABNORMAL LOW (ref >60)
Glucose, Bld: 125 mg/dL — ABNORMAL HIGH (ref 70–99)
Potassium: 3.6 mmol/L (ref 3.5–5.1)
Sodium: 140 mmol/L (ref 135–145)

## 2023-10-27 LAB — GLUCOSE, CAPILLARY
Glucose-Capillary: 129 mg/dL — ABNORMAL HIGH (ref 70–99)
Glucose-Capillary: 136 mg/dL — ABNORMAL HIGH (ref 70–99)
Glucose-Capillary: 149 mg/dL — ABNORMAL HIGH (ref 70–99)
Glucose-Capillary: 174 mg/dL — ABNORMAL HIGH (ref 70–99)

## 2023-10-27 LAB — CBC
HCT: 27.2 % — ABNORMAL LOW (ref 39.0–52.0)
Hemoglobin: 8.9 g/dL — ABNORMAL LOW (ref 13.0–17.0)
MCH: 31.1 pg (ref 26.0–34.0)
MCHC: 32.7 g/dL (ref 30.0–36.0)
MCV: 95.1 fL (ref 80.0–100.0)
Platelets: 348 10*3/uL (ref 150–400)
RBC: 2.86 MIL/uL — ABNORMAL LOW (ref 4.22–5.81)
RDW: 18.6 % — ABNORMAL HIGH (ref 11.5–15.5)
WBC: 14.2 10*3/uL — ABNORMAL HIGH (ref 4.0–10.5)
nRBC: 0 % (ref 0.0–0.2)

## 2023-10-27 MED ORDER — BUMETANIDE 1 MG PO TABS
2.0000 mg | ORAL_TABLET | Freq: Two times a day (BID) | ORAL | Status: DC
  Administered 2023-10-27 – 2023-10-28 (×2): 2 mg via ORAL
  Filled 2023-10-27 (×3): qty 2

## 2023-10-27 MED ORDER — BUMETANIDE 1 MG PO TABS
2.0000 mg | ORAL_TABLET | Freq: Two times a day (BID) | ORAL | Status: DC
  Filled 2023-10-27: qty 2

## 2023-10-27 MED ORDER — BUMETANIDE 1 MG PO TABS
2.0000 mg | ORAL_TABLET | Freq: Every day | ORAL | Status: DC
  Filled 2023-10-27: qty 2

## 2023-10-27 NOTE — Progress Notes (Signed)
Heart Failure Stewardship Pharmacy Note  PCP: Erasmo Downer, NP PCP-Cardiologist: None  HPI: Roberto Ingram is a 72 y.o. male with CAD s/p PTCA in 1994 and CABG in 2007, arthritis, BPH, anaplastic large cell lymphoma, gastric wedge resection of stromal tumor, CVA, T2DM, glaucoma, gout, HLD, OSA on CPAP, seizures, hypothyroidism, CKD, and diastolic heart failure who presented with midsternal chest pain, dyspnea, orthopnea, and LEE.   Pertinent cardiac history: AMI with balloon angioplasty D1 in 1994. CABG x 4 in 2007. Echo in 2007 with normal EF and G1DD. CVA in 2019 and 2021. Echo in 01/2017 with LVEF 60-65%, mild LVH, grade I diastolic dysfunction. Echo this admission with LVEF 55-60%, moderate to severe MR, and mild-moderate TR.  Pertinent Lab Values: Creatinine, Ser  Date Value Ref Range Status  10/26/2023 2.10 (H) 0.61 - 1.24 mg/dL Final   BUN  Date Value Ref Range Status  10/26/2023 27 (H) 8 - 23 mg/dL Final   Potassium  Date Value Ref Range Status  10/26/2023 3.9 3.5 - 5.1 mmol/L Final   Sodium  Date Value Ref Range Status  10/26/2023 140 135 - 145 mmol/L Final   B Natriuretic Peptide  Date Value Ref Range Status  10/22/2023 779.4 (H) 0.0 - 100.0 pg/mL Final    Comment:    Performed at [facility], [address] [place], [place] [address]   Magnesium  Date Value Ref Range Status  10/26/2023 1.7 1.7 - 2.4 mg/dL Final    Comment:    Performed at [facility] [address] [place], [place] [address]   Hgb A1c MFr Bld  Date Value Ref Range Status  07/19/2023 7.8 (H) 4.8 - 5.6 % Final    Comment:    (NOTE) Pre diabetes:          5.7%-6.4%  Diabetes:              >6.4%  Glycemic control for   <7.0% adults with diabetes    TSH  Date Value Ref Range Status  07/31/2023 4.276 0.350 - 4.500 uIU/mL Final    Comment:    Performed by a 3rd Generation assay with a functional sensitivity of <=0.01 uIU/mL. Performed at [facility], [address] [place], [place] [address]    LDH  Date Value Ref Range Status  07/21/2023 179 98 - 192 U/L Final    Comment:    Performed at [facility], [address] [place], [place] [address]    Vital Signs:  Temp:  [97.4 F (36.3 C)-98.4 F (36.9 C)] 98.4 F (36.9 C) (12/16 0341) Pulse Rate:  [73-83] 75 (12/16 0341) Cardiac Rhythm: Normal sinus rhythm;Bundle branch block (12/16 0725) Resp:  [18] 18 (12/16 0341) BP: (118-126)/(57-72) 118/57 (12/16 0341) SpO2:  [95 %-100 %] 100 % (12/16 0341)  Intake/Output Summary (Last 24 hours) at 10/27/2023 0742 Last data filed at 10/27/2023 0400 Gross per 24 hour  Intake 360 ml  Output --  Net 360 ml    Current Heart Failure Medications:  Loop diuretic: furosemide 80 mg IV BID + bumetanide 1 mg BID Beta-Blocker: metoprolol succinate 25 mg bid ACEI/ARB/ARNI: none MRA: none SGLT2i: Farxiga 10 mg daily Other: isosorbide 30 mg daily  Prior to admission Heart Failure Medications:  Loop diuretic: furosemide 80 mg daily Beta-Blocker: metoprolol tartrate 50 mg bid ACEI/ARB/ARNI: none MRA: none SGLT2i: none Other: isosorbide 30 mg daily, Ozempic 2 mg  Assessment: 1. Acute on chronic diastolic heart failure (LVEF most  recently 60-65%) with grade I diastolic dysfunction, due to unknown etiology. NYHA class III symptoms.  -Symptoms: Reports significant improvement in SOB and LEE. Able to walk the unit 3 times without signidicant shortness of breath. Pt is concerned about missing chemotherapy. -Volume: Appears to be nearly euvolemic on exam. Creatinine and BUN are up. Patient is receiving bumetanide PO and furosemide IV. Recommend transitioning to oral diuretics. -Hemodynamics: BP WNL today. HR 60-70s. Taking amlodipine 10 mg daily at home -SGLT2i: Added Farxiga 10 mg daily for HFpEF and CKD.  -MRA: Not a good candidate for spironolactone at this time given creatinine.  -ARNI: Not an excellent candidate for ARNI at  this time. -BB can worsen exercise capacity in HFpEF and worsen MR as well. Consider decreasing dose further to metoprolol succinate 25 mg daily  Plan: 1) Medication changes recommended at this time: -Consider reducing metoprolol succinate to 25 mg daily. -Agree with consolidation to bumetanide oral only. Will monitor response.  2) Patient assistance: -Jardiance copay is $143.45 and Marcelline Deist is $15.00   3) Education: - Patient has been educated on current HF medications and potential additions to HF medication regimen - Patient verbalizes understanding that over the next few months, these medication doses may change and more medications may be added to optimize HF regimen - Patient has been educated on basic disease state pathophysiology and goals of therapy  Medication Assistance / Insurance Benefits Check: Does the patient have prescription insurance?    Type of insurance plan:  Does the patient qualify for medication assistance through manufacturers or grants? Pending  Eligible grants and/or patient assistance programs: pending  Medication assistance applications in progress: pending  Medication assistance applications approved: pending Approved medication assistance renewals will be completed by: pending  Outpatient Pharmacy: Prior to admission outpatient pharmacy: [facility]     Please do not hesitate to reach out with questions or concerns,  Enos Fling, PharmD, CPP, BCPS Heart Failure Pharmacist  Phone - [phone] 10/27/2023 7:42 AM

## 2023-10-27 NOTE — Progress Notes (Signed)
Physical Therapy Treatment Patient Details Name: Roberto Ingram MRN: [id number] DOB: Feb 28, 1951 Today's Date: 10/27/2023   History of Present Illness GRAYLING YEPES is a 72 y.o. African-American male with medical history significant for multiple medical problems that are mentioned below, who presented to the emergency room with onset of midsternal chest pain mild in intensity with no radiation as well as dyspnea, orthopnea, and worsening lower extremity edema.    PT Comments  Pt in recline on entry, reports to be feeling ok today, a little improved in general, has been AMB in room a few times today without incident Pt/author/caregiver discussed DC planning and DME in place at home. Wife still hoping to acquire a lift chair, but author unable to offer any suggestions. PT demonstrates modI transfers and AMB c RW, takes his time to be safe, endorses confidence in ability to perform safe ADL mobility upon DC. Will continue to follow.    If plan is discharge home, recommend the following: A little help with walking and/or transfers;A little help with bathing/dressing/bathroom;Assistance with cooking/housework;Assist for transportation;Help with stairs or ramp for entrance   Can travel by private vehicle        Equipment Recommendations  None recommended by PT    Recommendations for Other Services       Precautions / Restrictions Precautions Precautions: Fall Precaution Comments: has experienced recent fall out of chair at home after last chemo treatment and unable to get up without physical assistance Restrictions Weight Bearing Restrictions Per Provider Order: No     Mobility  Bed Mobility                    Transfers   Equipment used: Rolling walker (2 wheels) Transfers: Sit to/from Stand Sit to Stand: Supervision                Ambulation/Gait Ambulation/Gait assistance: Contact guard assist Gait Distance (Feet): 240 Feet Assistive device: Rolling walker (2  wheels)   Gait velocity: 0.56 m/s         Stairs             Wheelchair Mobility     Tilt Bed    Modified Rankin (Stroke Patients Only)       Balance                                            Cognition Arousal: Alert Behavior During Therapy: WFL for tasks assessed/performed Overall Cognitive Status: Within Functional Limits for tasks assessed                                          Exercises      General Comments        Pertinent Vitals/Pain Pain Assessment Pain Assessment: No/denies pain    Home Living                          Prior Function            PT Goals (current goals can now be found in the care plan section) Acute Rehab PT Goals Patient Stated Goal: to get stronger and go home PT Goal Formulation: With patient Time For Goal Achievement: 11/08/23 Potential to Achieve Goals: Good Progress towards  PT goals: Progressing toward goals    Frequency    Min 1X/week      PT Plan      Co-evaluation              AM-PAC PT "6 Clicks" Mobility   Outcome Measure  Help needed turning from your back to your side while in a flat bed without using bedrails?: None Help needed moving from lying on your back to sitting on the side of a flat bed without using bedrails?: None Help needed moving to and from a bed to a chair (including a wheelchair)?: A Little Help needed standing up from a chair using your arms (e.g., wheelchair or bedside chair)?: A Little Help needed to walk in hospital room?: A Little Help needed climbing 3-5 steps with a railing? : A Little 6 Click Score: 20    End of Session   Activity Tolerance: Patient tolerated treatment well Patient left: in chair;with call bell/phone within reach;with chair alarm set;with family/visitor present Nurse Communication: Mobility status;Other (comment) PT Visit Diagnosis: Unsteadiness on feet (R26.81);Muscle weakness (generalized)  (M62.81)     Time: 8295-6213 PT Time Calculation (min) (ACUTE ONLY): 24 min  Charges:    $Therapeutic Activity: 23-37 mins PT General Charges $$ ACUTE PT VISIT: 1 Visit                    4:14 PM, 10/27/23 Rosamaria Lints, PT, DPT Physical Therapist - [facility]  [phone] (ASCOM)    Isao Seltzer C 10/27/2023, 4:10 PM

## 2023-10-27 NOTE — Progress Notes (Signed)
[facility] CLINIC CARDIOLOGY PROGRESS NOTE       Patient ID: Roberto Ingram MRN: [id number] DOB/AGE: [birth year] 72 y.o.  Admit date: 10/23/2023 Referring Physician Dr. Gillis Santa Primary Physician [place] Ouch, Alleen Borne, NP  Primary Cardiologist Dr. Marcina Millard Reason for Consultation AoCHF  HPI: Roberto Ingram is a 72 y.o. male  with a past medical history of coronary artery disease s/p CABGx4 in 2007, hypertension, hyperlipidemia, type II diabetes, hx CVA, hypothyroidism, asthma, chronic kidney disease stage IIIa, anaplastic large cell lymphoma, malignant gastrointestinal stromal tumor who presented to the ED on 10/23/2023 for leg swelling, shortness of breath. Cardiology was consulted for further evaluation.   Interval history: -Patient seen and examined this AM, states he is feeling much better overall.  -SOB and LE edema much improved.  -BP and HR are stable, he denies any chest pain or palpitations.   Review of systems complete and found to be negative unless listed above    Past Medical History:  Diagnosis Date   Anemia    Anxiety    Generalized anxiety disorder   Asthmatic bronchitis    Atopic dermatitis    BPH (benign prostatic hyperplasia)    Cardiovascular disease    Carotid disease, bilateral (HCC)    Colon polyp    Constipation    Coronary artery disease    Diabetes mellitus without complication (HCC)    Diabetic retinopathy (HCC)    GERD (gastroesophageal reflux disease)    Glaucoma of left eye    Gout    History of GI bleed    Hypercholesterolemia    Hypertension    Hypomagnesemia    Hypothyroidism    Insomnia    Mixed hyperlipidemia    Myocardial infarction (HCC)    Pneumonia    Renal disorder    Renal insufficiency    Sleep apnea    wears oxygen at night (unable to tolerate CPAP)   Stroke (HCC)    Stroke [facility])    Thyroid disease    Vitamin D deficiency     Past Surgical History:  Procedure Laterality Date   COLONOSCOPY      COLONOSCOPY WITH PROPOFOL N/A 02/24/2018   Procedure: COLONOSCOPY WITH PROPOFOL;  Surgeon: Toledo, Boykin Nearing, MD;  Location: [facility] ENDOSCOPY;  Service: Gastroenterology;  Laterality: N/A;   COLONOSCOPY WITH PROPOFOL N/A 08/06/2022   Procedure: COLONOSCOPY WITH PROPOFOL;  Surgeon: Regis Bill, MD;  Location: [facility] ENDOSCOPY;  Service: Gastroenterology;  Laterality: N/A;   CORONARY ANGIOPLASTY     CORONARY ARTERY BYPASS GRAFT     ESOPHAGOGASTRODUODENOSCOPY     EXPLORATION POST OPERATIVE OPEN HEART     EYE SURGERY     Lens eye surgery   INGUINAL LYMPH NODE BIOPSY Left 07/22/2023   Procedure: LEFT AXILLARY LYMPH NODE BIOPSY;  Surgeon: Carolan Shiver, MD;  Location: [facility] ORS;  Service: General;  Laterality: Left;   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)   07/2023   NASAL POLYP EXCISION  1994   POLYPECTOMY     Quadruple bypass      Medications Prior to Admission  Medication Sig Dispense Refill Last Dose/Taking   acetaminophen (TYLENOL) 325 MG tablet Take 650 mg by mouth every 6 (six) hours as needed.   Taking As Needed   acyclovir (ZOVIRAX) 400 MG tablet Take 400 mg by mouth 2 (two) times daily. Given at 9 am and 9 pm   10/22/2023   albuterol (VENTOLIN HFA) 108 (90 Base) MCG/ACT inhaler Inhale 2 puffs  into the lungs every 6 (six) hours as needed for wheezing or shortness of breath. (Patient taking differently: Inhale 2 puffs into the lungs every 4 (four) hours as needed for wheezing or shortness of breath.) 1 each 1 Taking Differently   allopurinol (ZYLOPRIM) 100 MG tablet Take 100 mg by mouth daily. Taken in the evening   10/21/2023 Evening   ALPRAZolam (XANAX) 0.5 MG tablet Take 0.5 mg by mouth at bedtime as needed for sleep.    Taking As Needed   amLODipine (NORVASC) 10 MG tablet Take 1 tablet by mouth daily.   10/22/2023 at  9:00 AM   ammonium lactate (AMLACTIN) 12 % cream Apply 1 Application topically 2 (two) times daily. Apply to arms, legs, and  body one to two times daily   10/22/2023   apixaban (ELIQUIS) 5 MG TABS tablet Take 2 tablets (10 mg total) by mouth 2 (two) times daily for 7 days, THEN 1 tablet (5 mg total) 2 (two) times daily. (Patient taking differently: Taken 2x daily, taken at 9 am and 9 pm ) 90 tablet 1 10/22/2023 at  9:00 AM   aspirin 81 MG chewable tablet Chew 81 mg by mouth daily.    10/22/2023   benzonatate (TESSALON) 100 MG capsule Take 100 mg by mouth 2 (two) times daily as needed for cough.   Taking As Needed   budesonide-formoterol (SYMBICORT) 160-4.5 MCG/ACT inhaler Inhale 2 puffs into the lungs every 12 (twelve) hours.   10/22/2023   bumetanide (BUMEX) 0.5 MG tablet Take 0.5 mg by mouth daily as needed.   Taking As Needed   Carboxymethylcellulose Sod PF 0.5 % SOLN Apply 1 drop to eye 3 (three) times daily as needed.   Taking As Needed   diphenhydrAMINE (BENADRYL ALLERGY) 25 mg capsule Take 1 capsule (25 mg total) by mouth every 6 (six) hours as needed. (Patient taking differently: Take 25 mg by mouth every 6 (six) hours as needed for itching, allergies or sleep. Not used often b/c it causes his prostate to swell per spouse) 30 capsule 0 Taking Differently   docusate sodium (COLACE) 100 MG capsule Take 100 mg by mouth daily as needed for mild constipation or moderate constipation.   Taking As Needed   [EXPIRED] fluconazole (DIFLUCAN) 100 MG tablet Take 100 mg by mouth daily.   10/21/2023   fluticasone (FLONASE) 50 MCG/ACT nasal spray Place 2 sprays into both nostrils daily as needed for allergies or rhinitis.   Taking As Needed   fluticasone-salmeterol (ADVAIR) 250-50 MCG/ACT AEPB Inhale 1 puff into the lungs 2 (two) times daily as needed.   Taking As Needed   furosemide (LASIX) 80 MG tablet Take 80 mg by mouth daily.   10/22/2023 at  9:00 AM   insulin aspart (NOVOLOG) 100 UNIT/ML injection Inject 5 Units into the skin 3 (three) times daily before meals.   10/22/2023   insulin glargine-yfgn (SEMGLEE) 100 UNIT/ML  injection Inject 0.15 mLs (15 Units total) into the skin daily. (Patient taking differently: Inject 14 Units into the skin at bedtime.) 10 mL 11 10/22/2023   levothyroxine (SYNTHROID) 50 MCG tablet Take 50 mcg by mouth daily before breakfast.    10/22/2023 Morning   LORazepam (ATIVAN) 1 MG tablet Take 1 mg by mouth every 8 (eight) hours as needed.   Taking As Needed   metoprolol tartrate (LOPRESSOR) 25 MG tablet Take 25 mg by mouth QID. Taken at 6 am, 12 noon, 6 pm and 12 midnight   10/22/2023 Noon  mirtazapine (REMERON) 7.5 MG tablet Take 7.5 mg by mouth at bedtime.   10/21/2023   Multiple Vitamin (MULTIVITAMIN WITH MINERALS) TABS tablet Take 1 tablet by mouth daily.   10/22/2023   nitroGLYCERIN (NITROSTAT) 0.4 MG SL tablet Place 0.4 mg under the tongue every 5 (five) minutes as needed for chest pain.    Taking As Needed   nystatin (MYCOSTATIN) 100000 UNIT/ML suspension Take 5 mLs by mouth 4 (four) times daily as needed.   Taking As Needed   oxyCODONE (OXY IR/ROXICODONE) 5 MG immediate release tablet Take 1 tablet (5 mg total) by mouth every 4 (four) hours as needed for breakthrough pain. 30 tablet 0 Taking As Needed   pantoprazole (PROTONIX) 40 MG tablet Take 40 mg by mouth daily.   10/22/2023 Morning   potassium chloride SA (KLOR-CON M) 20 MEQ tablet Take 20 mEq by mouth daily.   10/22/2023 Morning   predniSONE (DELTASONE) 50 MG tablet Take 50 mg by mouth as directed. Following chemo per spouse   Taking   rosuvastatin (CRESTOR) 40 MG tablet Take 40 mg by mouth at bedtime.   10/21/2023   tamsulosin (FLOMAX) 0.4 MG CAPS capsule Take 0.4 mg by mouth daily.   10/21/2023 Evening   Vitamin D, Ergocalciferol, (DRISDOL) 50000 units CAPS capsule Take 50,000 Units by mouth every 30 (thirty) days. Takes on 1st of each month   Past Month   VITAMIN D-1000 MAX ST 25 MCG (1000 UT) tablet Take 1,000 Units by mouth daily.   10/22/2023   colchicine 0.6 MG tablet Take 0.6 mg by mouth as needed (gout flares).  (Patient not taking: Reported on 10/23/2023)   Not Taking   isosorbide mononitrate (IMDUR) 30 MG 24 hr tablet Take 1 tablet (30 mg total) by mouth daily. (Patient not taking: Reported on 10/23/2023) 30 tablet 0 Not Taking   Omega-3 Fatty Acids (FISH OIL) 1000 MG CAPS Take 1,000 mg by mouth daily.  (Patient not taking: Reported on 10/23/2023)   Not Taking   Semaglutide, 2 MG/DOSE, (OZEMPIC, 2 MG/DOSE,) 8 MG/3ML SOPN Inject 2 mg into the skin every Thursday. (Patient not taking: Reported on 10/23/2023)   Not Taking   traZODone (DESYREL) 50 MG tablet Take 1 tablet by mouth at bedtime. (Patient not taking: Reported on 10/23/2023)   Not Taking   Social History   Socioeconomic History   Marital status: Married    Spouse name: Sheralyn Boatman   Number of children: 3   Years of education: 12   Highest education level: High school graduate  Occupational History   Occupation: retired  Tobacco Use   Smoking status: Former    Current packs/day: 0.00    Types: Cigarettes    Quit date: 06/11/1993    Years since quitting: 30.3   Smokeless tobacco: Never  Vaping Use   Vaping status: Never Used  Substance and Sexual Activity   Alcohol use: No   Drug use: No   Sexual activity: Not Currently    Comment: Married   Other Topics Concern   Not on file  Social History Narrative   Not on file   Social Drivers of Health   Financial Resource Strain: Low Risk  (09/16/2023)   Received from [facility]   Overall Financial Resource Strain (CARDIA)    Difficulty of Paying Living Expenses: Not very hard  Food Insecurity: No Food Insecurity (10/24/2023)   Hunger Vital Sign    Worried About Running Out of Food in the Last Year: Never  true    Ran Out of Food in the Last Year: Never true  Recent Concern: Food Insecurity - Food Insecurity Present (09/16/2023)   Received from [facility]   Hunger Vital Sign    Worried About Running Out of Food in the Last Year: Never true    Ran Out of  Food in the Last Year: Sometimes true  Transportation Needs: No Transportation Needs (10/24/2023)   PRAPARE - Administrator, Civil Service (Medical): No    Lack of Transportation (Non-Medical): No  Recent Concern: Transportation Needs - Unmet Transportation Needs (08/31/2023)   Received from [facility] - Transportation    In the past 12 months, has lack of transportation kept you from medical appointments or from getting medications?: Yes    Lack of Transportation (Non-Medical): No  Physical Activity: Insufficiently Active (03/04/2018)   Exercise Vital Sign    Days of Exercise per Week: 2 days    Minutes of Exercise per Session: 10 min  Stress: No Stress Concern Present (03/04/2018)   [employer] of Occupational Health - Occupational Stress Questionnaire    Feeling of Stress : Only a little  Social Connections: Socially Integrated (03/04/2018)   Social Connection and Isolation Panel [NHANES]    Frequency of Communication with Friends and Family: More than three times a week    Frequency of Social Gatherings with Friends and Family: Once a week    Attends Religious Services: More than 4 times per year    Active Member of Clubs or Organizations: Yes    Attends Banker Meetings: More than 4 times per year    Marital Status: Married  Catering manager Violence: Not At Risk (10/24/2023)   Humiliation, Afraid, Rape, and Kick questionnaire    Fear of Current or Ex-Partner: No    Emotionally Abused: No    Physically Abused: No    Sexually Abused: No    Family History  Problem Relation Age of Onset   Atrial fibrillation Mother    Heart disease Mother    Stroke Mother    Bell's palsy Mother    Kidney disease Mother    Cancer Mother    Diabetes Father    Arthritis Father    Stroke Brother    Diabetes Brother    Lung cancer Brother    Heart attack Brother    Cancer Brother    Heart disease Sister    COPD Sister    Throat  cancer Sister      Vitals:   10/26/23 2257 10/27/23 0341 10/27/23 0749 10/27/23 1207  BP: 122/65 (!) 118/57 116/65 125/71  Pulse: 83 75  68  Resp: 18 18    Temp: 97.9 F (36.6 C) 98.4 F (36.9 C) 97.6 F (36.4 C) 97.7 F (36.5 C)  TempSrc: Oral Oral Oral   SpO2: 100% 100%  97%  Weight:      Height:        PHYSICAL EXAM General: Well-appearing elderly male, well nourished, in no acute distress sitting upright in ED stretcher with wife present at bedside. HEENT: Normocephalic and atraumatic. Neck: No JVD.  Lungs: Normal respiratory effort on room air. Clear bilaterally to auscultation.  Bilateral crackles. Heart: HRRR. Normal S1 and S2, + systolic murmur. Abdomen: Non-distended appearing.  Msk: Normal strength and tone for age. Extremities: Warm and well perfused. No clubbing, cyanosis.  1+ pitting edema.  Neuro: Alert and oriented X 3. Psych: Answers questions  appropriately.   Labs: Basic Metabolic Panel: Recent Labs    10/25/23 0331 10/26/23 0509 10/27/23 0630  NA 135 140 140  K 3.4* 3.9 3.6  CL 96* 97* 95*  CO2 30 30 33*  GLUCOSE 108* 184* 125*  BUN 23 27* 31*  CREATININE 1.93* 2.10* 2.21*  CALCIUM 8.7* 9.0 8.7*  MG 1.8 1.7  --   PHOS 4.2 4.5  --    Liver Function Tests: No results for input(s): "AST", "ALT", "ALKPHOS", "BILITOT", "PROT", "ALBUMIN" in the last 72 hours. No results for input(s): "LIPASE", "AMYLASE" in the last 72 hours. CBC: Recent Labs    10/26/23 0509 10/27/23 0630  WBC 12.9* 14.2*  HGB 9.2* 8.9*  HCT 27.5* 27.2*  MCV 94.5 95.1  PLT 338 348   Cardiac Enzymes: No results for input(s): "CKTOTAL", "CKMB", "CKMBINDEX", "TROPONINIHS" in the last 72 hours.  BNP: No results for input(s): "BNP" in the last 72 hours.  D-Dimer: No results for input(s): "DDIMER" in the last 72 hours. Hemoglobin A1C: No results for input(s): "HGBA1C" in the last 72 hours. Fasting Lipid Panel: No results for input(s): "CHOL", "HDL", "LDLCALC", "TRIG",  "CHOLHDL", "LDLDIRECT" in the last 72 hours. Thyroid Function Tests: No results for input(s): "TSH", "T4TOTAL", "T3FREE", "THYROIDAB" in the last 72 hours.  Invalid input(s): "FREET3" Anemia Panel: No results for input(s): "VITAMINB12", "FOLATE", "FERRITIN", "TIBC", "IRON", "RETICCTPCT" in the last 72 hours.    Radiology: ECHOCARDIOGRAM COMPLETE Result Date: 10/23/2023    ECHOCARDIOGRAM REPORT   Patient Name:   Roberto Ingram Goodridge Date of Exam: 10/23/2023 Medical Rec #:  [id number]      Height:       72.0 in Accession #:    [id number]     Weight:       218.0 lb Date of Birth:  09-21-1951      BSA:          2.210 m Patient Age:    72 years       BP:           131/66 mmHg Patient Gender: M              HR:           84 bpm. Exam Location:  [facility] Procedure: 2D Echo, Color Doppler and Cardiac Doppler Indications:     CHF- acute diastolic I50.31  History:         Patient has prior history of Echocardiogram examinations, most                  recent 01/24/2017. CAD and Previous Myocardial Infarction,                  Stroke; Risk Factors:Diabetes.  Sonographer:     Cristela Blue Referring Phys:  [id number] JAN A MANSY Diagnosing Phys: Alwyn Pea MD  Sonographer Comments: Technically challenging study due to limited acoustic windows, no parasternal window and no subcostal window. IMPRESSIONS  1. Left ventricular ejection fraction, by estimation, is 55 to 60%. The left ventricle has normal function. The left ventricle has no regional wall motion abnormalities. The left ventricular internal cavity size was moderately dilated. Left ventricular diastolic parameters were normal.  2. Right ventricular systolic function is normal. The right ventricular size is normal.  3. The mitral valve is normal in structure. Moderate to severe mitral valve regurgitation.  4. Tricuspid valve regurgitation is mild to moderate.  5. The aortic valve is normal in structure. Aortic valve regurgitation is  mild. FINDINGS  Left Ventricle: Left  ventricular ejection fraction, by estimation, is 55 to 60%. The left ventricle has normal function. The left ventricle has no regional wall motion abnormalities. The left ventricular internal cavity size was moderately dilated. There is borderline left ventricular hypertrophy. Left ventricular diastolic parameters were normal. Right Ventricle: The right ventricular size is normal. No increase in right ventricular wall thickness. Right ventricular systolic function is normal. Left Atrium: Left atrial size was normal in size. Right Atrium: Right atrial size was normal in size. Pericardium: There is no evidence of pericardial effusion. Mitral Valve: The mitral valve is normal in structure. Moderate to severe mitral valve regurgitation. MV peak gradient, 6.2 mmHg. The mean mitral valve gradient is 3.0 mmHg. Tricuspid Valve: The tricuspid valve is grossly normal. Tricuspid valve regurgitation is mild to moderate. Aortic Valve: The aortic valve is normal in structure. Aortic valve regurgitation is mild. Aortic valve mean gradient measures 3.0 mmHg. Aortic valve peak gradient measures 4.0 mmHg. Aortic valve area, by VTI measures 3.32 cm. Pulmonic Valve: The pulmonic valve was normal in structure. Pulmonic valve regurgitation is trivial. Aorta: The ascending aorta was not well visualized. IAS/Shunts: No atrial level shunt detected by color flow Doppler.  LEFT VENTRICLE PLAX 2D LVIDd:         5.60 cm   Diastology LVIDs:         3.90 cm   LV e' medial:    6.31 cm/s LV PW:         1.00 cm   LV E/e' medial:  17.6 LV IVS:        1.20 cm   LV e' lateral:   9.57 cm/s LVOT diam:     2.00 cm   LV E/e' lateral: 11.6 LV SV:         65 LV SV Index:   29 LVOT Area:     3.14 cm  RIGHT VENTRICLE RV Basal diam:  3.50 cm RV Mid diam:    3.10 cm LEFT ATRIUM             Index        RIGHT ATRIUM           Index LA diam:        3.90 cm 1.76 cm/m   RA Area:     19.60 cm LA Vol (A2C):   65.4 ml 29.59 ml/m  RA Volume:   59.30 ml  26.83 ml/m  LA Vol (A4C):   65.3 ml 29.55 ml/m LA Biplane Vol: 69.2 ml 31.31 ml/m  AORTIC VALVE AV Area (Vmax):    3.55 cm AV Area (Vmean):   2.64 cm AV Area (VTI):     3.32 cm AV Vmax:           100.00 cm/s AV Vmean:          75.100 cm/s AV VTI:            0.195 m AV Peak Grad:      4.0 mmHg AV Mean Grad:      3.0 mmHg LVOT Vmax:         113.00 cm/s LVOT Vmean:        63.100 cm/s LVOT VTI:          0.206 m LVOT/AV VTI ratio: 1.06  AORTA Ao Root diam: 3.80 cm MITRAL VALVE                TRICUSPID VALVE MV Area (PHT): 4.41 cm  TR Peak grad:   6.8 mmHg MV Area VTI:   2.78 cm     TR Vmax:        130.00 cm/s MV Peak grad:  6.2 mmHg MV Mean grad:  3.0 mmHg     SHUNTS MV Vmax:       1.24 m/s     Systemic VTI:  0.21 m MV Vmean:      82.0 cm/s    Systemic Diam: 2.00 cm MV Decel Time: 172 msec MV E velocity: 111.00 cm/s MV A velocity: 79.10 cm/s MV E/A ratio:  1.40 Alwyn Pea MD Electronically signed by Alwyn Pea MD Signature Date/Time: 10/23/2023/1:43:44 PM    Final    DG Chest 2 View Result Date: 10/22/2023 CLINICAL DATA:  Chest pain EXAM: CHEST - 2 VIEW COMPARISON:  X-ray 07/31/2023. FINDINGS: Enlarged cardiopericardial silhouette with vascular congestion. Mild edema. Small bilateral pleural effusions. No pneumothorax. No consolidation. Degenerative changes seen along the spine. IMPRESSION: Postop chest with enlarged heart and vascular congestion. Mild edema suggested small effusions. Electronically Signed   By: Karen Kays M.D.   On: 10/22/2023 18:45    ECHO as above  TELEMETRY reviewed by me 10/27/2023: Sinus rhythm rate 90s  EKG reviewed by me: NSR rate 89 bpm, nonspecific ST-T changes  Data reviewed by me 10/27/2023: last 24h vitals tele labs imaging I/O ED provider note, admission H&P  Principal Problem:   Acute on chronic diastolic CHF (congestive heart failure) (HCC) Active Problems:   Essential hypertension   Asthma, chronic   Type 2 diabetes mellitus without complication, with  long-term current use of insulin (HCC)   Hypothyroidism   Dyslipidemia    ASSESSMENT AND PLAN:  Roberto Ingram is a 72 y.o. male  with a past medical history of coronary artery disease s/p CABGx4 in 2007, hypertension, hyperlipidemia, type II diabetes, hx CVA, hypothyroidism, asthma, chronic kidney disease stage IIIa, malignant gastrointestinal stromal tumor who presented to the ED on 10/23/2023 for leg swelling, shortness of breath. Cardiology was consulted for further evaluation.   # Acute on chronic HFpEF # Hypertension Patient with worsening LE edema and SOB x 1 week.  BNP elevated at 779.  Chest x-ray with pulmonary vascular congestion.  Echo this admission with EF 55-60%, no WMAs, LV moderately dilated, moderate to severe mitral regurg, mild to moderate tricuspid regurg. -Transition to po bumex 2 mg twice daily. Continue farxiga 10 mg daily.  -Continue metoprolol succinate 25 mg twice daily. Consider further additions to GDMT pending BP and renal function.  # Coronary artery disease s/p CABG x4 2007 # Hyperlipidemia Endorses twinges in chest which he has had for many years, denies any exertional chest pain.  Opponens mildly elevated and flat at 28 > 37. -Continue home rosuvastatin 40 mg daily and aspirin 81 mg daily. -Mildly elevated troponin most consistent with demand/supply mismatch and not ACS in the setting of acute heart failure. -No plan for further cardiac diagnostics at this time.   Anticipate readiness for DC tomorrow morning. Plan for follow up in clinic with Dr. Darrold Junker in 1 week.  This patient's plan of care was discussed and created with Dr. Juliann Pares and he is in agreement.  Signed: Gale Journey, PA-C  10/27/2023, 2:37 PM [facility]

## 2023-10-27 NOTE — Plan of Care (Signed)

## 2023-10-27 NOTE — Plan of Care (Signed)

## 2023-10-27 NOTE — Progress Notes (Signed)
Progress Note   Patient: Roberto Ingram:[id number] DOB: [birth year] DOA: 10/23/2023     4 DOS: the patient was seen and examined on 10/27/2023   Brief hospital course: Roberto Ingram is a 72 y.o. male with PMH of CAD status post CABGx7 in 2007, recently diagnosed anaplastic large cell lymphoma, GIST tumor, CKD stage IV, HTN, gout, IDDM, COPD, BPH, chronic pain syndrome and narcotic dependence, who presented to the hospital with chest pain, shortness of breath, orthopnea and swelling in the lower extremities.   Assessment and Plan:  Principal Problem:   Acute on chronic diastolic CHF (congestive heart failure) (HCC) Active Problems:   Essential hypertension   Asthma, chronic   Type 2 diabetes mellitus without complication, with long-term current use of insulin (HCC)   Hypothyroidism   Dyslipidemia       Body mass index is 29.54 kg/m.     Acute on chronic diastolic CHF, valvular heart disease:  Cardiologist managing Bumex dosing Monitor input and output 2D echo on 10/23/2023 showed LVEF 55 to 60%, moderate to severe mitral regurgitation and mild to moderate tricuspid regurgitation     Symptomatic anemia: Etiology unclear.  Hemoglobin improved from 6.6-8.9.  S/p transfusion with  units of PRBCs.  Monitor H&H.     CKD stage IIIb: Creatinine is stable.     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:  Continue Eliquis   CAD s/p CABG in 2007: Continue aspirin and statin     Hypokalemia: Improved Hypomagnesemia: Improved     Type II DM: Continue Semglee and NovoLog     Comorbidities include chronic hypoxemic respiratory failure on nocturnal oxygen at home, asthma, dyslipidemia, hypertension, hypothyroidism        Subjective:  Patient seen and examined at bedside this morning In the presence of cardiologist and patient's wife Bumex have been transitioned to oral today Denies worsening shortness of breath chest pain or cough  Physical  Exam: General: NAD, lying comfortably Appear in no distress, affect appropriate Eyes: PERRLA ENT: Oral Mucosa Clear, moist  Neck: no JVD,  Cardiovascular: S1 and S2 Present, audible murmur,  Respiratory: Equal air entry bilaterally, no wheezing, mild bibasilar crackles Abdomen: Bowel Sound present, Soft and no tenderness,  Skin: no rashes Extremities: 2+ pedal edema, no calf tenderness.  Edema significantly improved Neurologic: without any new focal findings Gait not checked due to patient safety concerns  Vitals:   10/27/23 0341 10/27/23 0749 10/27/23 1207 10/27/23 1613  BP: (!) 118/57 116/65 125/71 112/68  Pulse: 75  68 74  Resp: 18     Temp: 98.4 F (36.9 C) 97.6 F (36.4 C) 97.7 F (36.5 C) 98.5 F (36.9 C)  TempSrc: Oral Oral    SpO2: 100%  97% 100%  Weight:      Height:        Data Reviewed:    Latest Ref Rng & Units 10/27/2023    6:30 AM 10/26/2023    5:09 AM 10/25/2023    3:31 AM  BMP  Glucose 70 - 99 mg/dL 829  562  130   BUN 8 - 23 mg/dL 31  27  23    Creatinine 0.61 - 1.24 mg/dL 8.65  7.84  6.96   Sodium 135 - 145 mmol/L 140  140  135   Potassium 3.5 - 5.1 mmol/L 3.6  3.9  3.4   Chloride 98 - 111 mmol/L 95  97  96   CO2 22 - 32 mmol/L 33  30  30   Calcium 8.9 - 10.3 mg/dL 8.7  9.0  8.7        Latest Ref Rng & Units 10/27/2023    6:30 AM 10/26/2023    5:09 AM 10/25/2023    3:31 AM  CBC  WBC 4.0 - 10.5 K/uL 14.2  12.9  16.2   Hemoglobin 13.0 - 17.0 g/dL 8.9  9.2  8.9   Hematocrit 39.0 - 52.0 % 27.2  27.5  26.7   Platelets 150 - 400 K/uL 348  338  334     Author: Loyce Dys, MD 10/27/2023 6:17 PM  For on call review [email or web address].

## 2023-10-28 DIAGNOSIS — I5033 Acute on chronic diastolic (congestive) heart failure: Secondary | ICD-10-CM | POA: Diagnosis not present

## 2023-10-28 LAB — CBC
HCT: 28.2 % — ABNORMAL LOW (ref 39.0–52.0)
Hemoglobin: 9.2 g/dL — ABNORMAL LOW (ref 13.0–17.0)
MCH: 30.9 pg (ref 26.0–34.0)
MCHC: 32.6 g/dL (ref 30.0–36.0)
MCV: 94.6 fL (ref 80.0–100.0)
Platelets: 371 10*3/uL (ref 150–400)
RBC: 2.98 MIL/uL — ABNORMAL LOW (ref 4.22–5.81)
RDW: 18.6 % — ABNORMAL HIGH (ref 11.5–15.5)
WBC: 12.5 10*3/uL — ABNORMAL HIGH (ref 4.0–10.5)
nRBC: 0 % (ref 0.0–0.2)

## 2023-10-28 LAB — GLUCOSE, CAPILLARY
Glucose-Capillary: 136 mg/dL — ABNORMAL HIGH (ref 70–99)
Glucose-Capillary: 117 mg/dL — ABNORMAL HIGH (ref 70–99)

## 2023-10-28 LAB — BASIC METABOLIC PANEL
Anion gap: 11 (ref 5–15)
BUN: 36 mg/dL — ABNORMAL HIGH (ref 8–23)
CO2: 33 mmol/L — ABNORMAL HIGH (ref 22–32)
Calcium: 8.9 mg/dL (ref 8.9–10.3)
Chloride: 96 mmol/L — ABNORMAL LOW (ref 98–111)
Creatinine, Ser: 2.25 mg/dL — ABNORMAL HIGH (ref 0.61–1.24)
GFR, Estimated: 30 mL/min — ABNORMAL LOW (ref >60)
Glucose, Bld: 170 mg/dL — ABNORMAL HIGH (ref 70–99)
Potassium: 3.4 mmol/L — ABNORMAL LOW (ref 3.5–5.1)
Sodium: 140 mmol/L (ref 135–145)

## 2023-10-28 MED ORDER — BUMETANIDE 2 MG PO TABS: 2.0000 mg | ORAL_TABLET | Freq: Two times a day (BID) | ORAL | 0 refills | Status: AC

## 2023-10-28 MED ORDER — METOPROLOL SUCCINATE ER 25 MG PO TB24: 25.0000 mg | ORAL_TABLET | Freq: Two times a day (BID) | ORAL | 1 refills | Status: AC

## 2023-10-28 MED ORDER — TRAZODONE HCL 50 MG PO TABS: 25.0000 mg | ORAL_TABLET | Freq: Every day | ORAL | 11 refills | Status: AC

## 2023-10-28 MED ORDER — POTASSIUM CHLORIDE CRYS ER 20 MEQ PO TBCR
40.0000 meq | EXTENDED_RELEASE_TABLET | Freq: Once | ORAL | Status: AC
  Administered 2023-10-28: 40 meq via ORAL
  Filled 2023-10-28: qty 2

## 2023-10-28 MED ORDER — ASCORBIC ACID 500 MG PO TABS: 500.0000 mg | ORAL_TABLET | Freq: Every day | ORAL | 0 refills | Status: AC

## 2023-10-28 MED ORDER — DAPAGLIFLOZIN PROPANEDIOL 10 MG PO TABS: 10.0000 mg | ORAL_TABLET | Freq: Every day | ORAL | 2 refills | Status: AC

## 2023-10-28 NOTE — Progress Notes (Signed)
Heart Failure Stewardship Pharmacy Note  PCP: Erasmo Downer, NP PCP-Cardiologist: None  HPI: Roberto Ingram is a 72 y.o. male with CAD s/p PTCA in 1994 and CABG in 2007, arthritis, BPH, anaplastic large cell lymphoma, gastric wedge resection of stromal tumor, CVA, T2DM, glaucoma, gout, HLD, OSA on CPAP, seizures, hypothyroidism, CKD, and diastolic heart failure who presented with midsternal chest pain, dyspnea, orthopnea, and LEE.   Pertinent cardiac history: AMI with balloon angioplasty D1 in 1994. CABG x 4 in 2007. Echo in 2007 with normal EF and G1DD. CVA in 2019 and 2021. Echo in 01/2017 with LVEF 60-65%, mild LVH, grade I diastolic dysfunction. Echo this admission with LVEF 55-60%, moderate to severe MR, and mild-moderate TR.  Pertinent Lab Values: Creatinine, Ser  Date Value Ref Range Status  10/28/2023 2.25 (H) 0.61 - 1.24 mg/dL Final   BUN  Date Value Ref Range Status  10/28/2023 36 (H) 8 - 23 mg/dL Final   Potassium  Date Value Ref Range Status  10/28/2023 3.4 (L) 3.5 - 5.1 mmol/L Final   Sodium  Date Value Ref Range Status  10/28/2023 140 135 - 145 mmol/L Final   B Natriuretic Peptide  Date Value Ref Range Status  10/22/2023 779.4 (H) 0.0 - 100.0 pg/mL Final    Comment:    Performed at [facility], [address] [place], [place] [address]   Magnesium  Date Value Ref Range Status  10/26/2023 1.7 1.7 - 2.4 mg/dL Final    Comment:    Performed at [facility], [address] [place], [place] [address]   Hgb A1c MFr Bld  Date Value Ref Range Status  07/19/2023 7.8 (H) 4.8 - 5.6 % Final    Comment:    (NOTE) Pre diabetes:          5.7%-6.4%  Diabetes:              >6.4%  Glycemic control for   <7.0% adults with diabetes    TSH  Date Value Ref Range Status  07/31/2023 4.276 0.350 - 4.500 uIU/mL Final    Comment:    Performed by a 3rd Generation assay with a functional sensitivity of <=0.01 uIU/mL. Performed at [facility], [address] [place], [place] [address]    LDH  Date Value Ref Range Status  07/21/2023 179 98 - 192 U/L Final    Comment:    Performed at [facility], [address] [place], [place] [address]    Vital Signs:  Temp:  [97.6 F (36.4 C)-98.8 F (37.1 C)] 98.4 F (36.9 C) (12/17 0407) Pulse Rate:  [68-79] 71 (12/17 0407) Cardiac Rhythm: Normal sinus rhythm (12/16 1901) Resp:  [16-19] 16 (12/17 0407) BP: (112-134)/(63-71) 129/65 (12/17 0407) SpO2:  [97 %-100 %] 98 % (12/17 0407)  Intake/Output Summary (Last 24 hours) at 10/28/2023 0733 Last data filed at 10/28/2023 0600 Gross per 24 hour  Intake 480 ml  Output 1000 ml  Net -520 ml    Current Heart Failure Medications:  Loop diuretic: bumetanide 2 mg BID Beta-Blocker: metoprolol succinate 25 mg bid ACEI/ARB/ARNI: none MRA: none SGLT2i: Farxiga 10 mg daily Other: isosorbide 30 mg daily  Prior to admission Heart Failure Medications:  Loop diuretic: furosemide 80 mg daily Beta-Blocker: metoprolol tartrate 50 mg bid ACEI/ARB/ARNI: none MRA: none SGLT2i: none Other: isosorbide 30 mg daily, Ozempic 2 mg  Assessment: 1. Acute on chronic diastolic heart failure (LVEF most recently 60-65%) with grade I diastolic  dysfunction, due to unknown etiology. NYHA class III symptoms.  -Symptoms: Reports significant improvement in SOB and LEE. Able to walk the unit without signidicant shortness of breath.  -Volume: Appears to be euvolemic on exam. Creatinine and BUN are trending slightly up, but relatively stable from yesterday. Patient is receiving bumetanide PO BID. Experienced some dizziness this AM when standing. May be somewhat dry, can consider discharge on bumetanide 2 mg daily with an additional 2 mg prn. -Hemodynamics: BP up today. HR 60-70s. Taking amlodipine 10 mg daily at home -SGLT2i: Added Farxiga 10 mg daily for HFpEF and CKD.  -MRA: Not a good candidate for spironolactone at this time given  creatinine.  -ARNI: Not an excellent candidate for ARNI at this time. -BB can worsen exercise capacity in HFpEF and sometimes propetuate MR as well. Consider decreasing dose further to metoprolol succinate 25 mg daily  Plan: 1) Medication changes recommended at this time: - Consider changing bumetanide to 2 mg daily with an additional 2 mg as needed in the afternoon -Consider reducing metoprolol succinate to 25 mg daily.  2) Patient assistance: -Jardiance copay is $143.45 and Marcelline Deist is $15.00   3) Education: - Patient has been educated on current HF medications and potential additions to HF medication regimen - Patient verbalizes understanding that over the next few months, these medication doses may change and more medications may be added to optimize HF regimen - Patient has been educated on basic disease state pathophysiology and goals of therapy  Medication Assistance / Insurance Benefits Check: Does the patient have prescription insurance?    Type of insurance plan:  Does the patient qualify for medication assistance through manufacturers or grants? Pending  Eligible grants and/or patient assistance programs: pending  Medication assistance applications in progress: pending  Medication assistance applications approved: pending Approved medication assistance renewals will be completed by: pending  Outpatient Pharmacy: Prior to admission outpatient pharmacy: [facility]     Please do not hesitate to reach out with questions or concerns,  Enos Fling, PharmD, CPP, BCPS Heart Failure Pharmacist  Phone - [phone] 10/28/2023 7:33 AM

## 2023-10-28 NOTE — TOC Transition Note (Signed)
Transition of Care [facility]) - Discharge Note   Patient Details  Name: Roberto Ingram MRN: [id number] Date of Birth: Sep 02, 1951  Transition of Care [facility]) CM/SW Contact:  Truddie Hidden, RN Phone Number: 10/28/2023, 3:24 PM   Clinical Narrative:    Spoke with patient and his wife at bedside regarding HHPT. Patient's wife stated patient is active with HH via [facility]. . Patient advised the accepting agency will contact him directly to scheduled SOC within 48 post discharge.  Discharge notification sent to Artavia from [facility].          Patient Goals and CMS Choice            Discharge Placement                       Discharge Plan and Services Additional resources added to the After Visit Summary for                                       Social Drivers of Health (SDOH) Interventions SDOH Screenings   Food Insecurity: No Food Insecurity (10/24/2023)  Recent Concern: Food Insecurity - Food Insecurity Present (09/16/2023)   Received from [facility]  Housing: Low Risk  (10/24/2023)  Transportation Needs: No Transportation Needs (10/24/2023)  Recent Concern: Transportation Needs - Unmet Transportation Needs (08/31/2023)   Received from [facility]  Utilities: Not At Risk (10/24/2023)  Financial Resource Strain: Low Risk  (09/16/2023)   Received from [facility]  Physical Activity: Insufficiently Active (03/04/2018)  Social Connections: Socially Integrated (03/04/2018)  Stress: No Stress Concern Present (03/04/2018)  Tobacco Use: Medium Risk (10/23/2023)  Health Literacy: Adequate Health Literacy (09/29/2023)   Received from [facility]     Readmission Risk Interventions     No data to display

## 2023-10-28 NOTE — Discharge Summary (Signed)
Physician Discharge Summary   Patient: Roberto Ingram MRN: [id number] DOB: 15-Jul-1951  Admit date:     10/23/2023  Discharge date: 10/28/23  Discharge Physician: Loyce Dys   PCP: Erasmo Downer, NP   Recommendations at discharge:  Follow-up with cardiology  Discharge Diagnoses:  Acute on chronic diastolic CHF, valvular heart disease:  Symptomatic anemia: Etiology unclear.  CKD stage IIIb: Creatinine is stable. 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 male with PMH of CAD status post CABGx7 in 2007, recently diagnosed anaplastic large cell lymphoma, GIST tumor, CKD stage IV, HTN, gout, IDDM, COPD, BPH, chronic pain syndrome and narcotic dependence, who presented to the hospital with chest pain, shortness of breath, orthopnea and swelling in the lower extremities.  Underwent IV diuresis that was later transitioned to oral.  Was seen by cardiologist and have been cleared for discharge today and to follow-up as an outpatient.  Consultants: Cardiology  Procedures performed: None Disposition: Home Diet recommendation:  Discharge Diet Orders (From admission, onward)     Start     Ordered   10/28/23 0000  Diet - low sodium heart healthy        10/28/23 1423           Cardiac diet DISCHARGE MEDICATION: Allergies as of 10/28/2023       Reactions   Antihistamines, Chlorpheniramine-type Swelling   Prostat   Diphenhydramine Hcl Swelling   Fluorescein Dermatitis, Hives, Itching, Other (See Comments)   Iodinated Contrast Media Other (See Comments)   Lisinopril Swelling   Ezetimibe Hives, Other (See Comments)   Metformin Hives, Other (See Comments)    Metoprolol Itching   Nsaids Other (See Comments)   Does take aspirin Other Reaction(s): Kidney Disorder   Sulfa Antibiotics Hives, Other (See Comments)   Brilinta [ticagrelor] Other (See Comments)   Headaches, off balance, no appetite   Diphenhydramine Hcl Other (See Comments)   "The more I take, it makes my prostate swell..."   Other Itching, Other (See Comments)   FA Dye Prostate swelling FA Dye Prostate swelling        Medication List     STOP taking these medications    amLODipine 10 MG tablet Commonly known as: NORVASC   ammonium lactate 12 % cream Commonly known as: AMLACTIN   benzonatate 100 MG capsule Commonly known as: TESSALON   Carboxymethylcellulose Sod PF 0.5 % Soln   diphenhydrAMINE 25 mg capsule Commonly known as: Benadryl Allergy   fluconazole 100 MG tablet Commonly known as: DIFLUCAN   fluticasone-salmeterol 250-50 MCG/ACT Aepb Commonly known as: ADVAIR   furosemide 80 MG tablet Commonly known as: LASIX   isosorbide mononitrate 30 MG 24 hr tablet Commonly known as: IMDUR   LORazepam 1 MG tablet Commonly known as: ATIVAN   metoprolol tartrate 25 MG tablet Commonly known as: LOPRESSOR   nystatin 100000 UNIT/ML suspension Commonly known as: MYCOSTATIN   predniSONE 50 MG tablet Commonly known as: DELTASONE       TAKE these medications    acetaminophen 325 MG tablet Commonly known as: TYLENOL Take 650 mg by mouth every 6 (six) hours as needed.   acyclovir 400 MG tablet Commonly known as: ZOVIRAX Take 400 mg by  mouth 2 (two) times daily. Given at 9 am and 9 pm   albuterol 108 (90 Base) MCG/ACT inhaler Commonly known as: VENTOLIN HFA Inhale 2 puffs into the lungs every 6 (six) hours as needed for wheezing or shortness of breath. What changed: when to take this   allopurinol 100 MG tablet Commonly known as: ZYLOPRIM Take 100 mg by mouth daily. Taken in the evening   ALPRAZolam 0.5 MG tablet Commonly known as: XANAX Take 0.5  mg by mouth at bedtime as needed for sleep.   apixaban 5 MG Tabs tablet Commonly known as: ELIQUIS Take 2 tablets (10 mg total) by mouth 2 (two) times daily for 7 days, THEN 1 tablet (5 mg total) 2 (two) times daily. Start taking on: August 04, 2023 What changed: See the new instructions.   ascorbic acid 500 MG tablet Commonly known as: VITAMIN C Take 1 tablet (500 mg total) by mouth daily.   aspirin 81 MG chewable tablet Chew 81 mg by mouth daily.   budesonide-formoterol 160-4.5 MCG/ACT inhaler Commonly known as: SYMBICORT Inhale 2 puffs into the lungs every 12 (twelve) hours.   bumetanide 2 MG tablet Commonly known as: BUMEX Take 1 tablet (2 mg total) by mouth 2 (two) times daily. What changed:  medication strength how much to take when to take this reasons to take this   Colace 100 MG capsule Generic drug: docusate sodium Take 100 mg by mouth daily as needed for mild constipation or moderate constipation.   colchicine 0.6 MG tablet Take 0.6 mg by mouth as needed (gout flares).   Crestor 40 MG tablet Generic drug: rosuvastatin Take 40 mg by mouth at bedtime.   dapagliflozin propanediol 10 MG Tabs tablet Commonly known as: FARXIGA Take 1 tablet (10 mg total) by mouth daily. Start taking on: October 29, 2023   Fish Oil 1000 MG Caps Take 1,000 mg by mouth daily.   fluticasone 50 MCG/ACT nasal spray Commonly known as: FLONASE Place 2 sprays into both nostrils daily as needed for allergies or rhinitis.   insulin aspart 100 UNIT/ML injection Commonly known as: novoLOG Inject 5 Units into the skin 3 (three) times daily before meals.   insulin glargine-yfgn 100 UNIT/ML injection Commonly known as: SEMGLEE Inject 0.15 mLs (15 Units total) into the skin daily. What changed:  how much to take when to take this   metoprolol succinate 25 MG 24 hr tablet Commonly known as: TOPROL-XL Take 1 tablet (25 mg total) by mouth 2 (two) times daily.   mirtazapine 7.5  MG tablet Commonly known as: REMERON Take 7.5 mg by mouth at bedtime.   multivitamin with minerals Tabs tablet Take 1 tablet by mouth daily.   nitroGLYCERIN 0.4 MG SL tablet Commonly known as: NITROSTAT Place 0.4 mg under the tongue every 5 (five) minutes as needed for chest pain.   oxyCODONE 5 MG immediate release tablet Commonly known as: Oxy IR/ROXICODONE Take 1 tablet (5 mg total) by mouth every 4 (four) hours as needed for breakthrough pain.   Ozempic (2 MG/DOSE) 8 MG/3ML Sopn Generic drug: Semaglutide (2 MG/DOSE) Inject 2 mg into the skin every Thursday.   pantoprazole 40 MG tablet Commonly known as: PROTONIX Take 40 mg by mouth daily.   potassium chloride SA 20 MEQ tablet Commonly known as: KLOR-CON M Take 20 mEq by mouth daily.   Synthroid 50 MCG tablet Generic drug: levothyroxine Take 50 mcg by mouth daily before breakfast.   tamsulosin 0.4 MG Caps capsule Commonly known  as: FLOMAX Take 0.4 mg by mouth daily.   traZODone 50 MG tablet Commonly known as: DESYREL Take 0.5 tablets (25 mg total) by mouth at bedtime. What changed: how much to take   Vitamin D (Ergocalciferol) 1.25 MG (50000 UNIT) Caps capsule Commonly known as: DRISDOL Take 50,000 Units by mouth every 30 (thirty) days. Takes on 1st of each month   Vitamin D-1000 Max St 25 MCG (1000 UT) tablet Generic drug: Cholecalciferol Take 1,000 Units by mouth daily.               Durable Medical Equipment  (From admission, onward)           Start     Ordered   10/25/23 0000  For home use only DME Other see comment       Comments: Michiel Sites lift  Question:  Length of Need  Answer:  Lifetime   10/25/23 1152            Follow-up Information     Paraschos, Alexander, MD. Go in 1 week(s).   Specialty: Cardiology Contact information: [address] [facility] [place] [address] [phone]                Discharge Exam: Ceasar Mons Weights   10/22/23  1659 10/25/23 0500  Weight: 98.9 kg 98.8 kg   General: NAD, lying comfortably Appear in no distress, affect appropriate Eyes: PERRLA ENT: Oral Mucosa Clear, moist  Neck: no JVD,  Cardiovascular: S1 and S2 Present, audible murmur,  Respiratory: Equal air entry bilaterally, no wheezing, mild bibasilar crackles Abdomen: Bowel Sound present, Soft and no tenderness,  Skin: no rashes Extremities: 2+ pedal edema, no calf tenderness.  Edema significantly improved Neurologic: without any new focal findings Gait not checked due to patient safety concerns  Condition at discharge: good  The results of significant diagnostics from this hospitalization (including imaging, microbiology, ancillary and laboratory) are listed below for reference.   Imaging Studies: ECHOCARDIOGRAM COMPLETE Result Date: 10/23/2023    ECHOCARDIOGRAM REPORT   Patient Name:   KEMARI BALBIN Copen Date of Exam: 10/23/2023 Medical Rec #:  [id number]      Height:       72.0 in Accession #:    [id number]     Weight:       218.0 lb Date of Birth:  [birth year]      BSA:          2.210 m Patient Age:    72 years       BP:           131/66 mmHg Patient Gender: M              HR:           84 bpm. Exam Location:  [facility] Procedure: 2D Echo, Color Doppler and Cardiac Doppler Indications:     CHF- acute diastolic I50.31  History:         Patient has prior history of Echocardiogram examinations, most                  recent 01/24/2017. CAD and Previous Myocardial Infarction,                  Stroke; Risk Factors:Diabetes.  Sonographer:     Cristela Blue Referring Phys:  [id number] JAN A MANSY Diagnosing Phys: Alwyn Pea MD  Sonographer Comments: Technically challenging study due to limited acoustic windows, no parasternal window and no subcostal window. IMPRESSIONS  1. Left ventricular ejection fraction, by estimation, is 55 to 60%. The left ventricle has normal function. The left ventricle has no regional wall motion abnormalities. The left ventricular  internal cavity size was moderately dilated. Left ventricular diastolic parameters were normal.  2. Right ventricular systolic function is normal. The right ventricular size is normal.  3. The mitral valve is normal in structure. Moderate to severe mitral valve regurgitation.  4. Tricuspid valve regurgitation is mild to moderate.  5. The aortic valve is normal in structure. Aortic valve regurgitation is mild. FINDINGS  Left Ventricle: Left ventricular ejection fraction, by estimation, is 55 to 60%. The left ventricle has normal function. The left ventricle has no regional wall motion abnormalities. The left ventricular internal cavity size was moderately dilated. There is borderline left ventricular hypertrophy. Left ventricular diastolic parameters were normal. Right Ventricle: The right ventricular size is normal. No increase in right ventricular wall thickness. Right ventricular systolic function is normal. Left Atrium: Left atrial size was normal in size. Right Atrium: Right atrial size was normal in size. Pericardium: There is no evidence of pericardial effusion. Mitral Valve: The mitral valve is normal in structure. Moderate to severe mitral valve regurgitation. MV peak gradient, 6.2 mmHg. The mean mitral valve gradient is 3.0 mmHg. Tricuspid Valve: The tricuspid valve is grossly normal. Tricuspid valve regurgitation is mild to moderate. Aortic Valve: The aortic valve is normal in structure. Aortic valve regurgitation is mild. Aortic valve mean gradient measures 3.0 mmHg. Aortic valve peak gradient measures 4.0 mmHg. Aortic valve area, by VTI measures 3.32 cm. Pulmonic Valve: The pulmonic valve was normal in structure. Pulmonic valve regurgitation is trivial. Aorta: The ascending aorta was not well visualized. IAS/Shunts: No atrial level shunt detected by color flow Doppler.  LEFT VENTRICLE PLAX 2D LVIDd:         5.60 cm   Diastology LVIDs:         3.90 cm   LV e' medial:    6.31 cm/s LV PW:         1.00 cm    LV E/e' medial:  17.6 LV IVS:        1.20 cm   LV e' lateral:   9.57 cm/s LVOT diam:     2.00 cm   LV E/e' lateral: 11.6 LV SV:         65 LV SV Index:   29 LVOT Area:     3.14 cm  RIGHT VENTRICLE RV Basal diam:  3.50 cm RV Mid diam:    3.10 cm LEFT ATRIUM             Index        RIGHT ATRIUM           Index LA diam:        3.90 cm 1.76 cm/m   RA Area:     19.60 cm LA Vol (A2C):   65.4 ml 29.59 ml/m  RA Volume:   59.30 ml  26.83 ml/m LA Vol (A4C):   65.3 ml 29.55 ml/m LA Biplane Vol: 69.2 ml 31.31 ml/m  AORTIC VALVE AV Area (Vmax):    3.55 cm AV Area (Vmean):   2.64 cm AV Area (VTI):     3.32 cm AV Vmax:           100.00 cm/s AV Vmean:          75.100 cm/s AV VTI:            0.195 m  AV Peak Grad:      4.0 mmHg AV Mean Grad:      3.0 mmHg LVOT Vmax:         113.00 cm/s LVOT Vmean:        63.100 cm/s LVOT VTI:          0.206 m LVOT/AV VTI ratio: 1.06  AORTA Ao Root diam: 3.80 cm MITRAL VALVE                TRICUSPID VALVE MV Area (PHT): 4.41 cm     TR Peak grad:   6.8 mmHg MV Area VTI:   2.78 cm     TR Vmax:        130.00 cm/s MV Peak grad:  6.2 mmHg MV Mean grad:  3.0 mmHg     SHUNTS MV Vmax:       1.24 m/s     Systemic VTI:  0.21 m MV Vmean:      82.0 cm/s    Systemic Diam: 2.00 cm MV Decel Time: 172 msec MV E velocity: 111.00 cm/s MV A velocity: 79.10 cm/s MV E/A ratio:  1.40 Alwyn Pea MD Electronically signed by Alwyn Pea MD Signature Date/Time: 10/23/2023/1:43:44 PM    Final    DG Chest 2 View Result Date: 10/22/2023 CLINICAL DATA:  Chest pain EXAM: CHEST - 2 VIEW COMPARISON:  X-ray 07/31/2023. FINDINGS: Enlarged cardiopericardial silhouette with vascular congestion. Mild edema. Small bilateral pleural effusions. No pneumothorax. No consolidation. Degenerative changes seen along the spine. IMPRESSION: Postop chest with enlarged heart and vascular congestion. Mild edema suggested small effusions. Electronically Signed   By: Karen Kays M.D.   On: 10/22/2023 18:45     Microbiology: Results for orders placed or performed during the hospital encounter of 07/31/23  SARS Coronavirus 2 by RT PCR (hospital order, performed in [facility] hospital lab) *cepheid single result test* Anterior Nasal Swab     Status: Abnormal   Collection Time: 07/31/23  7:39 AM   Specimen: Anterior Nasal Swab  Result Value Ref Range Status   SARS Coronavirus 2 by RT PCR POSITIVE (A) NEGATIVE Final    Comment: (NOTE) SARS-CoV-2 target nucleic acids are DETECTED  SARS-CoV-2 RNA is generally detectable in upper respiratory specimens  during the acute phase of infection.  Positive results are indicative  of the presence of the identified virus, but do not rule out bacterial infection or co-infection with other pathogens not detected by the test.  Clinical correlation with patient history and  other diagnostic information is necessary to determine patient infection status.  The expected result is negative.  Fact Sheet for Patients:   RoadLapTop.co.za   Fact Sheet for Healthcare Providers:   http://kim-miller.com/    This test is not yet approved or cleared by the [place] FDA and  has been authorized for detection and/or diagnosis of SARS-CoV-2 by FDA under an Emergency Use Authorization (EUA).  This EUA will remain in effect (meaning this test can be used) for the duration of  the COVID-19 declaration under Section 564(b)(1)  of the Act, 21 U.S.C. section 360-bbb-3(b)(1), unless the authorization is terminated or revoked sooner.   Performed at [facility], [address] [place], [place] [address]     Labs: CBC: Recent Labs  Lab 10/24/23 [phone] 10/25/23 0331 10/26/23 0509 10/27/23 0630 10/28/23 0523  WBC 15.6* 16.2* 12.9* 14.2* 12.5*  HGB 7.2* 8.9* 9.2* 8.9* 9.2*  HCT 22.3* 26.7* 27.5* 27.2* 28.2*  MCV 93.7  93.4 94.5 95.1 94.6  PLT 309 334 338 348 371   Basic Metabolic Panel: Recent Labs  Lab  10/23/23 0500 10/24/23 0547 10/25/23 0331 10/26/23 0509 10/27/23 0630 10/28/23 0523  NA 139 139 135 140 140 140  K 3.5 3.6 3.4* 3.9 3.6 3.4*  CL 104 103 96* 97* 95* 96*  CO2 28 30 30 30  33* 33*  GLUCOSE 119* 121* 108* 184* 125* 170*  BUN 25* 24* 23 27* 31* 36*  CREATININE 2.00* 2.00* 1.93* 2.10* 2.21* 2.25*  CALCIUM 8.2* 8.7* 8.7* 9.0 8.7* 8.9  MG 1.5* 1.8 1.8 1.7  --   --   PHOS 3.2 3.5 4.2 4.5  --   --    Liver Function Tests: No results for input(s): "AST", "ALT", "ALKPHOS", "BILITOT", "PROT", "ALBUMIN" in the last 168 hours. CBG: Recent Labs  Lab 10/27/23 1209 10/27/23 1617 10/27/23 2311 10/28/23 0758 10/28/23 1204  GLUCAP 136* 149* 174* 136* 117*    Discharge time spent:  .  Signed: Loyce Dys, MD [facility] 10/28/2023

## 2023-10-28 NOTE — Progress Notes (Signed)
  Chaplain On-Call responded to a page to visit the patient and his wife.  Chaplain met the patient and his wife Roberto Ingram.  Chaplain provided spiritual and emotional support and prayer, and gave them a copy of the NT/Psalms.  Chaplain Morene Crocker., [facility]

## 2023-10-28 NOTE — Plan of Care (Signed)

## 2023-10-28 NOTE — Care Management Important Message (Signed)
Important Message  Patient Details  Name: Roberto Ingram MRN: [id number] Date of Birth: November 28, 1950   Important Message Given:  Yes - Medicare IM     Olegario Messier A Mariateresa Batra 10/28/2023, 2:59 PM

## 2023-11-04 ENCOUNTER — Other Ambulatory Visit
Admission: RE | Admit: 2023-11-04 | Discharge: 2023-11-04 | Disposition: A | Discharge status: Home / Self Care | Payer: Medicare Other | Source: Ambulatory Visit | Attending: Nurse Practitioner | Admitting: Nurse Practitioner

## 2023-11-04 DIAGNOSIS — I5030 Unspecified diastolic (congestive) heart failure: Secondary | ICD-10-CM | POA: Insufficient documentation

## 2023-11-04 LAB — BRAIN NATRIURETIC PEPTIDE: B Natriuretic Peptide: 418.4 pg/mL — ABNORMAL HIGH (ref 0.0–100.0)

## 2023-11-13 ENCOUNTER — Other Ambulatory Visit
Admission: RE | Admit: 2023-11-13 | Discharge: 2023-11-13 | Disposition: A | Discharge status: Home / Self Care | Payer: Medicare Other | Source: Ambulatory Visit | Attending: Nurse Practitioner | Admitting: Nurse Practitioner

## 2023-11-13 DIAGNOSIS — I5030 Unspecified diastolic (congestive) heart failure: Secondary | ICD-10-CM | POA: Insufficient documentation

## 2023-11-13 DIAGNOSIS — N184 Chronic kidney disease, stage 4 (severe): Secondary | ICD-10-CM | POA: Diagnosis present

## 2023-11-13 LAB — BRAIN NATRIURETIC PEPTIDE: B Natriuretic Peptide: 736.5 pg/mL — ABNORMAL HIGH (ref 0.0–100.0)

## 2023-12-19 ENCOUNTER — Other Ambulatory Visit: Payer: Self-pay

## 2023-12-19 ENCOUNTER — Emergency Department
Admission: EM | Admit: 2023-12-19 | Discharge: 2023-12-19 | Disposition: A | Discharge status: Home / Self Care | Payer: Medicare Other | Attending: Emergency Medicine | Admitting: Emergency Medicine

## 2023-12-19 ENCOUNTER — Emergency Department: Payer: Medicare Other

## 2023-12-19 DIAGNOSIS — R079 Chest pain, unspecified: Secondary | ICD-10-CM | POA: Insufficient documentation

## 2023-12-19 DIAGNOSIS — I509 Heart failure, unspecified: Secondary | ICD-10-CM | POA: Diagnosis not present

## 2023-12-19 DIAGNOSIS — J449 Chronic obstructive pulmonary disease, unspecified: Secondary | ICD-10-CM | POA: Insufficient documentation

## 2023-12-19 DIAGNOSIS — N184 Chronic kidney disease, stage 4 (severe): Secondary | ICD-10-CM | POA: Insufficient documentation

## 2023-12-19 DIAGNOSIS — E109 Type 1 diabetes mellitus without complications: Secondary | ICD-10-CM | POA: Insufficient documentation

## 2023-12-19 DIAGNOSIS — I2510 Atherosclerotic heart disease of native coronary artery without angina pectoris: Secondary | ICD-10-CM | POA: Diagnosis not present

## 2023-12-19 DIAGNOSIS — Z951 Presence of aortocoronary bypass graft: Secondary | ICD-10-CM | POA: Insufficient documentation

## 2023-12-19 DIAGNOSIS — I130 Hypertensive heart and chronic kidney disease with heart failure and stage 1 through stage 4 chronic kidney disease, or unspecified chronic kidney disease: Secondary | ICD-10-CM | POA: Diagnosis not present

## 2023-12-19 LAB — CBC
HCT: 26.7 % — ABNORMAL LOW (ref 39.0–52.0)
Hemoglobin: 8.8 g/dL — ABNORMAL LOW (ref 13.0–17.0)
MCH: 32.5 pg (ref 26.0–34.0)
MCHC: 33 g/dL (ref 30.0–36.0)
MCV: 98.5 fL (ref 80.0–100.0)
Platelets: 318 10*3/uL (ref 150–400)
RBC: 2.71 MIL/uL — ABNORMAL LOW (ref 4.22–5.81)
RDW: 17.5 % — ABNORMAL HIGH (ref 11.5–15.5)
WBC: 8.4 10*3/uL (ref 4.0–10.5)
nRBC: 0 % (ref 0.0–0.2)

## 2023-12-19 LAB — TROPONIN I (HIGH SENSITIVITY)
Troponin I (High Sensitivity): 29 ng/L — ABNORMAL HIGH (ref <18)
Troponin I (High Sensitivity): 27 ng/L — ABNORMAL HIGH (ref <18)

## 2023-12-19 LAB — BASIC METABOLIC PANEL
Anion gap: 9 (ref 5–15)
BUN: 50 mg/dL — ABNORMAL HIGH (ref 8–23)
CO2: 29 mmol/L (ref 22–32)
Calcium: 9 mg/dL (ref 8.9–10.3)
Chloride: 101 mmol/L (ref 98–111)
Creatinine, Ser: 2.46 mg/dL — ABNORMAL HIGH (ref 0.61–1.24)
GFR, Estimated: 27 mL/min — ABNORMAL LOW (ref >60)
Glucose, Bld: 192 mg/dL — ABNORMAL HIGH (ref 70–99)
Potassium: 4.1 mmol/L (ref 3.5–5.1)
Sodium: 139 mmol/L (ref 135–145)

## 2023-12-19 LAB — BRAIN NATRIURETIC PEPTIDE: B Natriuretic Peptide: 590.5 pg/mL — ABNORMAL HIGH (ref 0.0–100.0)

## 2023-12-19 NOTE — Discharge Instructions (Addendum)
 There was no signs of heart attack today but you need to follow-up with cardiology Return to the ER if you develop worsening symptoms or any other concerns otherwise please call your cardiologist to make a follow-up appointment and continue with your echocardiogram planned next week

## 2023-12-19 NOTE — ED Provider Notes (Addendum)
 [facility] Provider Note    Event Date/Time   First MD Initiated Contact with Patient 12/19/23 1118     (approximate)   History   Chest Pain   HPI  Roberto Ingram is a 73 y.o. male  CAD status post CABGx7 in 2007, recently diagnosed anaplastic large cell lymphoma, GIST tumor, CKD stage IV, HTN, gout, IDDM, COPD, BPH, chronic pain syndrome, who comes in with chest pain.  Reviewed patient's history and he is previously admitted back in December 2024 with CHF underwent IV diuresis.  Patient states that he intermittently has some chest pain at baseline.  He states that his pain today started while at rest and then it just seem to prolong a little bit longer than he is typical for so he wanted to come in and make sure that everything look good with his heart.  He denies any shortness of breath, abdominal pain, any falls or hitting his head.  He reports being compliant with his blood thinner.  He reports taking his Bumex  with good urine output.  They report that he is got baseline swelling in his ankles left worse than right secondary to having surgeries on the left side for his lymphoma that need to be removed.  This has been constant but much improved swelling now that he has been on the Bumex .  Physical Exam   Triage Vital Signs: ED Triage Vitals  Encounter Vitals Group     BP 12/19/23 1027 137/72     Systolic BP Percentile --      Diastolic BP Percentile --      Pulse Rate 12/19/23 1027 (!) 58     Resp 12/19/23 1027 18     Temp 12/19/23 1027 97.9 F (36.6 C)     Temp Source 12/19/23 1027 Oral     SpO2 12/19/23 1027 100 %     Weight 12/19/23 0955 193 lb (87.5 kg)     Height 12/19/23 0955 6' (1.829 m)     Head Circumference --      Peak Flow --      Pain Score 12/19/23 0955 8     Pain Loc --      Pain Education --      Exclude from Growth Chart --     Most recent vital signs: Vitals:   12/19/23 1027  BP: 137/72  Pulse: (!) 58  Resp: 18  Temp:  97.9 F (36.6 C)  SpO2: 100%     General: Awake, no distress.  CV:  Good peripheral perfusion.  Resp:  Normal effort. Clear lungs  Abd:  No distention.  Soft and nontender Other:  Slight edema noted in bilateral ankles slightly worse on the left but according to family this is baseline no calf tenderness.   ED Results / Procedures / Treatments   Labs (all labs ordered are listed, but only abnormal results are displayed) Labs Reviewed  BASIC METABOLIC PANEL - Abnormal; Notable for the following components:      Result Value   Glucose, Bld 192 (*)    BUN 50 (*)    Creatinine, Ser 2.46 (*)    GFR, Estimated 27 (*)    All other components within normal limits  CBC - Abnormal; Notable for the following components:   RBC 2.71 (*)    Hemoglobin 8.8 (*)    HCT 26.7 (*)    RDW 17.5 (*)    All other components within normal limits  TROPONIN I (  HIGH SENSITIVITY) - Abnormal; Notable for the following components:   Troponin I (High Sensitivity) 29 (*)    All other components within normal limits     EKG  My interpretation of EKG:  Normal sinus rhythm 64 without any ST elevation, incomplete right bundle branch block with some possible inversions in the inferior lateral leads but is a lot of artifact.   Sinus bradycardia rate of 58 without any ST elevation, inversions in V4 V5 and V6   RADIOLOGY I have reviewed the xray personally and interpreted and no evidence of any pneumonia  PROCEDURES:  Critical Care performed: No  Procedures   MEDICATIONS ORDERED IN ED: Medications - No data to display   IMPRESSION / MDM / ASSESSMENT AND PLAN / ED COURSE  I reviewed the triage vital signs and the nursing notes.   Patient's presentation is most consistent with acute presentation with potential threat to life or bodily function.   Patient comes in with chest pain.  Patient well-appearing.  Differential includes ACS.  Doubt PE given patient is already on blood thinner.  No pain  radiating to the back, numbness tingling to suggest dissection seems less likely.  No abdominal pain.  Doubt DVT given patient on Eliquis  and they report the swelling in the legs are baseline for him.   EKG shows some T wave inversions.  Difficult to tell if new from prior EKG given the other EKGs have had a lot of artifact but cardiac markers will be ordered.  Doubt PE given he is on Eliquis   Troponin slightly elevated but on repeat is downtrending and stable.  Unlikely ACS  Consider CT imaging to rule out PE but patient on Eliquis .  Considered CT imaging to rule out dissection but this seems less likely patient reports resolution of pain.  He would have more risks than benefits with CT scan with contrast given CKD, allergies.  His creatinine is slightly uptrending and BNP is lower than when he was admitted a month ago  I considered admission given patient's history of bypass.  However given resolution of chest pain he denies any exertional chest pain I think be reasonable to follow-up outpatient.  Family feels comfortable with that plan.  We discussed that we cannot predict future heart attacks and that if he develops return of symptoms, worsening symptoms that he should return to the ER for repeat evaluation  Patient reports echocardiogram scheduled in 1 week and can follow-up outpatient with cardiology     FINAL CLINICAL IMPRESSION(S) / ED DIAGNOSES   Final diagnoses:  Nonspecific chest pain     Rx / DC Orders   ED Discharge Orders     None        Note:  This document was prepared using Dragon voice recognition software and may include unintentional dictation errors.   Ernest Ronal BRAVO, MD 12/19/23 1339    Ernest Ronal BRAVO, MD 12/19/23 1340

## 2023-12-19 NOTE — ED Triage Notes (Signed)
 Pt to ED for chest pain x2 hours. Denies shob, n/v. NAD noted
